# Patient Record
Sex: Female | Born: 1945
Health system: Southern US, Community
[De-identification: ages and names within clinical notes are randomized; demographics above are authoritative.]

## PROBLEM LIST (undated history)

## (undated) ENCOUNTER — Emergency Department (HOSPITAL_BASED_OUTPATIENT_CLINIC_OR_DEPARTMENT_OTHER): Payer: Medicare Other

## (undated) DIAGNOSIS — I2510 Atherosclerotic heart disease of native coronary artery without angina pectoris: Secondary | ICD-10-CM

## (undated) DIAGNOSIS — I480 Paroxysmal atrial fibrillation: Secondary | ICD-10-CM

## (undated) DIAGNOSIS — E785 Hyperlipidemia, unspecified: Secondary | ICD-10-CM

## (undated) DIAGNOSIS — I10 Essential (primary) hypertension: Secondary | ICD-10-CM

## (undated) DIAGNOSIS — I779 Disorder of arteries and arterioles, unspecified: Secondary | ICD-10-CM

## (undated) HISTORY — DX: Atherosclerotic heart disease of native coronary artery without angina pectoris: I25.10

## (undated) HISTORY — DX: Disorder of arteries and arterioles, unspecified: I77.9

## (undated) HISTORY — DX: Paroxysmal atrial fibrillation: I48.0

## (undated) HISTORY — DX: Hyperlipidemia, unspecified: E78.5

---

## 1993-06-03 HISTORY — PX: BREAST EXCISIONAL BIOPSY: SUR124

## 2004-04-05 ENCOUNTER — Ambulatory Visit: Payer: Self-pay | Admitting: Family Medicine

## 2004-08-08 ENCOUNTER — Ambulatory Visit: Payer: Self-pay

## 2005-04-10 ENCOUNTER — Ambulatory Visit: Payer: Self-pay | Admitting: Unknown Physician Specialty

## 2005-06-03 HISTORY — PX: FRACTURE SURGERY: SHX138

## 2006-04-23 ENCOUNTER — Ambulatory Visit: Payer: Self-pay | Admitting: Unknown Physician Specialty

## 2006-09-03 ENCOUNTER — Inpatient Hospital Stay: Payer: Self-pay | Admitting: Specialist

## 2006-09-03 ENCOUNTER — Other Ambulatory Visit: Payer: Self-pay

## 2006-09-09 ENCOUNTER — Encounter: Payer: Self-pay | Admitting: Internal Medicine

## 2006-10-02 ENCOUNTER — Encounter: Payer: Self-pay | Admitting: Internal Medicine

## 2007-05-05 ENCOUNTER — Ambulatory Visit: Payer: Self-pay | Admitting: Unknown Physician Specialty

## 2007-08-11 ENCOUNTER — Emergency Department: Payer: Self-pay | Admitting: Internal Medicine

## 2007-08-22 ENCOUNTER — Ambulatory Visit: Payer: Self-pay | Admitting: Specialist

## 2008-05-17 ENCOUNTER — Ambulatory Visit: Payer: Self-pay | Admitting: Unknown Physician Specialty

## 2009-05-19 ENCOUNTER — Ambulatory Visit: Payer: Self-pay | Admitting: Unknown Physician Specialty

## 2010-06-07 ENCOUNTER — Ambulatory Visit: Payer: Self-pay | Admitting: Unknown Physician Specialty

## 2011-07-09 ENCOUNTER — Ambulatory Visit: Payer: Self-pay | Admitting: Unknown Physician Specialty

## 2011-08-26 ENCOUNTER — Emergency Department: Payer: Self-pay | Admitting: *Deleted

## 2011-08-26 LAB — COMPREHENSIVE METABOLIC PANEL
Albumin: 4.1 g/dL (ref 3.4–5.0)
Anion Gap: 8 (ref 7–16)
BUN: 15 mg/dL (ref 7–18)
Bilirubin,Total: 0.4 mg/dL (ref 0.2–1.0)
Calcium, Total: 10 mg/dL (ref 8.5–10.1)
Chloride: 105 mmol/L (ref 98–107)
Co2: 29 mmol/L (ref 21–32)
EGFR (African American): >60
EGFR (Non-African Amer.): >60
Glucose: 105 mg/dL — ABNORMAL HIGH (ref 65–99)
Potassium: 4.2 mmol/L (ref 3.5–5.1)
SGPT (ALT): 21 U/L
Sodium: 142 mmol/L (ref 136–145)
Total Protein: 7.3 g/dL (ref 6.4–8.2)

## 2011-08-26 LAB — CK TOTAL AND CKMB (NOT AT ARMC): CK-MB: 2.7 ng/mL (ref 0.5–3.6)

## 2011-08-26 LAB — CBC
HCT: 37.8 % (ref 35.0–47.0)
MCV: 90 fL (ref 80–100)
Platelet: 207 10*3/uL (ref 150–440)
RDW: 13.4 % (ref 11.5–14.5)
WBC: 4.8 10*3/uL (ref 3.6–11.0)

## 2011-08-26 LAB — TROPONIN I
Troponin-I: 0.02 ng/mL
Troponin-I: 0.02 ng/mL

## 2012-09-21 ENCOUNTER — Ambulatory Visit: Payer: Self-pay | Admitting: Family Medicine

## 2012-09-23 ENCOUNTER — Ambulatory Visit: Payer: Self-pay | Admitting: Family Medicine

## 2013-03-30 ENCOUNTER — Ambulatory Visit: Payer: Self-pay | Admitting: Family Medicine

## 2013-09-23 ENCOUNTER — Ambulatory Visit: Payer: Self-pay | Admitting: Family Medicine

## 2014-09-02 ENCOUNTER — Ambulatory Visit
Admit: 2014-09-02 | Disposition: A | Discharge status: Home / Self Care | Payer: Self-pay | Attending: Gastroenterology | Admitting: Gastroenterology

## 2014-09-16 ENCOUNTER — Other Ambulatory Visit: Payer: Self-pay | Admitting: Family Medicine

## 2014-09-16 DIAGNOSIS — Z1231 Encounter for screening mammogram for malignant neoplasm of breast: Secondary | ICD-10-CM

## 2014-09-26 LAB — SURGICAL PATHOLOGY

## 2014-10-10 ENCOUNTER — Other Ambulatory Visit: Payer: Self-pay | Admitting: Family Medicine

## 2014-10-10 ENCOUNTER — Ambulatory Visit
Admission: RE | Admit: 2014-10-10 | Discharge: 2014-10-10 | Disposition: A | Discharge status: Home / Self Care | Payer: Medicare Other | Source: Ambulatory Visit | Attending: Family Medicine | Admitting: Family Medicine

## 2014-10-10 DIAGNOSIS — Z1231 Encounter for screening mammogram for malignant neoplasm of breast: Secondary | ICD-10-CM | POA: Diagnosis not present

## 2015-04-11 ENCOUNTER — Telehealth: Payer: Self-pay

## 2015-04-11 NOTE — Telephone Encounter (Signed)
Left message

## 2015-04-11 NOTE — Telephone Encounter (Signed)
US completed, report called, US of Left Lower extremity, negative for blood clot.  Please advise.

## 2015-04-11 NOTE — Telephone Encounter (Signed)
Please let pt know that US was negative for DVT. Please let her know to call us if any recurrent or persistent symptoms.

## 2015-09-19 ENCOUNTER — Other Ambulatory Visit: Payer: Self-pay | Admitting: Family Medicine

## 2015-09-19 DIAGNOSIS — Z1231 Encounter for screening mammogram for malignant neoplasm of breast: Secondary | ICD-10-CM

## 2015-10-26 ENCOUNTER — Ambulatory Visit
Admission: RE | Admit: 2015-10-26 | Discharge: 2015-10-26 | Disposition: A | Discharge status: Home / Self Care | Payer: Medicare Other | Source: Ambulatory Visit | Attending: Family Medicine | Admitting: Family Medicine

## 2015-10-26 ENCOUNTER — Other Ambulatory Visit: Payer: Self-pay | Admitting: Family Medicine

## 2015-10-26 DIAGNOSIS — Z1231 Encounter for screening mammogram for malignant neoplasm of breast: Secondary | ICD-10-CM | POA: Insufficient documentation

## 2016-09-30 ENCOUNTER — Other Ambulatory Visit: Payer: Self-pay | Admitting: Family Medicine

## 2016-09-30 DIAGNOSIS — Z1231 Encounter for screening mammogram for malignant neoplasm of breast: Secondary | ICD-10-CM

## 2016-10-31 ENCOUNTER — Ambulatory Visit
Admission: RE | Admit: 2016-10-31 | Discharge: 2016-10-31 | Disposition: A | Discharge status: Home / Self Care | Payer: Medicare Other | Source: Ambulatory Visit | Attending: Family Medicine | Admitting: Family Medicine

## 2016-10-31 DIAGNOSIS — Z1231 Encounter for screening mammogram for malignant neoplasm of breast: Secondary | ICD-10-CM

## 2017-10-07 ENCOUNTER — Other Ambulatory Visit: Payer: Self-pay | Admitting: Family Medicine

## 2017-10-07 DIAGNOSIS — Z1231 Encounter for screening mammogram for malignant neoplasm of breast: Secondary | ICD-10-CM

## 2017-11-10 ENCOUNTER — Ambulatory Visit
Admission: RE | Admit: 2017-11-10 | Discharge: 2017-11-10 | Disposition: A | Discharge status: Home / Self Care | Payer: Medicare Other | Source: Ambulatory Visit | Attending: Family Medicine | Admitting: Family Medicine

## 2017-11-10 DIAGNOSIS — Z1231 Encounter for screening mammogram for malignant neoplasm of breast: Secondary | ICD-10-CM | POA: Insufficient documentation

## 2018-07-01 ENCOUNTER — Other Ambulatory Visit: Payer: Self-pay | Admitting: Orthopaedic Surgery

## 2018-07-28 NOTE — Patient Instructions (Addendum)
Zasha Montiel  07/28/2018   Your procedure is scheduled on: Tuesday 08/04/2018  Report to [facility] Main  Entrance              Report to admitting at   110 PM    Call this number if you have problems the morning of surgery [phone]    Remember: Do not eat food  :After Midnight. May have clear liquids from midnight up until 0940 am then nothing until after surgery!    CLEAR LIQUID DIET   Foods Allowed                                                                     Foods Excluded  Coffee and tea, regular and decaf                             liquids that you cannot  Plain Jell-O in any flavor                                             see through such as: Fruit ices (not with fruit pulp)                                     milk, soups, orange juice  Iced Popsicles                                    All solid food Carbonated beverages, regular and diet                                    Cranberry, grape and apple juices Sports drinks like Gatorade Lightly seasoned clear broth or consume(fat free) Sugar, honey syrup  Sample Menu Breakfast                                Lunch                                     Supper Cranberry juice                    Beef broth                            Chicken broth Jell-O                                     Grape juice  Apple juice Coffee or tea                        Jell-O                                      Popsicle                                                Coffee or tea                        Coffee or tea  _____________________________________________________________________                BRUSH YOUR TEETH MORNING OF SURGERY AND RINSE YOUR MOUTH OUT, NO CHEWING GUM CANDY OR MINTS.     Take these medicines the morning of surgery with A SIP OF WATER: Amlodipine (Norvasc), Hydralazine (Apresoline), use Flonase nasal spray                                  You may  not have any metal on your body including hair pins and              piercings  Do not wear jewelry, make-up, lotions, powders or perfumes, deodorant             Do not wear nail polish.  Do not shave  48 hours prior to surgery.                Do not bring valuables to the hospital. [facility] IS NOT             RESPONSIBLE   FOR VALUABLES.  Contacts, dentures or bridgework may not be worn into surgery.  Leave suitcase in the car. After surgery it may be brought to your room.                  Please read over the following fact sheets you were given: _____________________________________________________________________             [facility] - Preparing for Surgery Before surgery, you can play an important role.  Because skin is not sterile, your skin needs to be as free of germs as possible.  You can reduce the number of germs on your skin by washing with CHG (chlorahexidine gluconate) soap before surgery.  CHG is an antiseptic cleaner which kills germs and bonds with the skin to continue killing germs even after washing. Please DO NOT use if you have an allergy to CHG or antibacterial soaps.  If your skin becomes reddened/irritated stop using the CHG and inform your nurse when you arrive at Short Stay. Do not shave (including legs and underarms) for at least 48 hours prior to the first CHG shower.  You may shave your face/neck. Please follow these instructions carefully:  1.  Shower with CHG Soap the night before surgery and the  morning of Surgery.  2.  If you choose to wash your hair, wash your hair first as usual with your  normal  shampoo.  3.  After you shampoo, rinse your hair and body thoroughly to remove the  shampoo.  4.  Use CHG as you would any other liquid soap.  You can apply chg directly  to the skin and wash                       Gently with a scrungie or clean washcloth.  5.  Apply the CHG Soap to your body ONLY FROM THE NECK DOWN.   Do not use  on face/ open                           Wound or open sores. Avoid contact with eyes, ears mouth and genitals (private parts).                       Wash face,  Genitals (private parts) with your normal soap.             6.  Wash thoroughly, paying special attention to the area where your surgery  will be performed.  7.  Thoroughly rinse your body with warm water from the neck down.  8.  DO NOT shower/wash with your normal soap after using and rinsing off  the CHG Soap.                9.  Pat yourself dry with a clean towel.            10.  Wear clean pajamas.            11.  Place clean sheets on your bed the night of your first shower and do not  sleep with pets. Day of Surgery : Do not apply any lotions/deodorants the morning of surgery.  Please wear clean clothes to the hospital/surgery center.  FAILURE TO FOLLOW THESE INSTRUCTIONS MAY RESULT IN THE CANCELLATION OF YOUR SURGERY PATIENT SIGNATURE_________________________________  NURSE SIGNATURE__________________________________  ________________________________________________________________________   Adam Phenix  An incentive spirometer is a tool that can help keep your lungs clear and active. This tool measures how well you are filling your lungs with each breath. Taking long deep breaths may help reverse or decrease the chance of developing breathing (pulmonary) problems (especially infection) following:  A long period of time when you are unable to move or be active. BEFORE THE PROCEDURE   If the spirometer includes an indicator to show your best effort, your nurse or respiratory therapist will set it to a desired goal.  If possible, sit up straight or lean slightly forward. Try not to slouch.  Hold the incentive spirometer in an upright position. INSTRUCTIONS FOR USE  1. Sit on the edge of your bed if possible, or sit up as far as you can in bed or on a chair. 2. Hold the incentive spirometer in an upright  position. 3. Breathe out normally. 4. Place the mouthpiece in your mouth and seal your lips tightly around it. 5. Breathe in slowly and as deeply as possible, raising the piston or the ball toward the top of the column. 6. Hold your breath for 3-5 seconds or for as long as possible. Allow the piston or ball to fall to the bottom of the column. 7. Remove the mouthpiece from your mouth and breathe out normally. 8. Rest for a few seconds and repeat Steps 1 through 7 at least 10 times every 1-2 hours when you are awake. Take your time and take a few normal breaths between deep breaths. 9. The spirometer may include an indicator to  show your best effort. Use the indicator as a goal to work toward during each repetition. 10. After each set of 10 deep breaths, practice coughing to be sure your lungs are clear. If you have an incision (the cut made at the time of surgery), support your incision when coughing by placing a pillow or rolled up towels firmly against it. Once you are able to get out of bed, walk around indoors and cough well. You may stop using the incentive spirometer when instructed by your caregiver.  RISKS AND COMPLICATIONS  Take your time so you do not get dizzy or light-headed.  If you are in pain, you may need to take or ask for pain medication before doing incentive spirometry. It is harder to take a deep breath if you are having pain. AFTER USE  Rest and breathe slowly and easily.  It can be helpful to keep track of a log of your progress. Your caregiver can provide you with a simple table to help with this. If you are using the spirometer at home, follow these instructions: [place] IF:   You are having difficultly using the spirometer.  You have trouble using the spirometer as often as instructed.  Your pain medication is not giving enough relief while using the spirometer.  You develop fever of 100.5 F (38.1 C) or higher. SEEK IMMEDIATE MEDICAL CARE IF:    You cough up bloody sputum that had not been present before.  You develop fever of 102 F (38.9 C) or greater.  You develop worsening pain at or near the incision site. MAKE SURE YOU:   Understand these instructions.  Will watch your condition.  Will get help right away if you are not doing well or get worse. Document Released: 09/30/2006 Document Revised: 08/12/2011 Document Reviewed: 12/01/2006 Exit[employer] Patient Information 2014 [employer], [place].   ________________________________________________________________________  WHAT IS A BLOOD TRANSFUSION? Blood Transfusion Information  A transfusion is the replacement of blood or some of its parts. Blood is made up of multiple cells which provide different functions.  Red blood cells carry oxygen and are used for blood loss replacement.  White blood cells fight against infection.  Platelets control bleeding.  Plasma helps clot blood.  Other blood products are available for specialized needs, such as hemophilia or other clotting disorders. BEFORE THE TRANSFUSION  Who gives blood for transfusions?   Healthy volunteers who are fully evaluated to make sure their blood is safe. This is blood bank blood. Transfusion therapy is the safest it has ever been in the practice of medicine. Before blood is taken from a donor, a complete history is taken to make sure that person has no history of diseases nor engages in risky social behavior (examples are intravenous drug use or sexual activity with multiple partners). The donor's travel history is screened to minimize risk of transmitting infections, such as malaria. The donated blood is tested for signs of infectious diseases, such as HIV and hepatitis. The blood is then tested to be sure it is compatible with you in order to minimize the chance of a transfusion reaction. If you or a relative donates blood, this is often done in anticipation of surgery and is not appropriate for emergency  situations. It takes many days to process the donated blood. RISKS AND COMPLICATIONS Although transfusion therapy is very safe and saves many lives, the main dangers of transfusion include:   Getting an infectious disease.  Developing a transfusion reaction. This is an allergic reaction  to something in the blood you were given. Every precaution is taken to prevent this. The decision to have a blood transfusion has been considered carefully by your caregiver before blood is given. Blood is not given unless the benefits outweigh the risks. AFTER THE TRANSFUSION  Right after receiving a blood transfusion, you will usually feel much better and more energetic. This is especially true if your red blood cells have gotten low (anemic). The transfusion raises the level of the red blood cells which carry oxygen, and this usually causes an energy increase.  The nurse administering the transfusion will monitor you carefully for complications. HOME CARE INSTRUCTIONS  No special instructions are needed after a transfusion. You may find your energy is better. Speak with your caregiver about any limitations on activity for underlying diseases you may have. SEEK MEDICAL CARE IF:   Your condition is not improving after your transfusion.  You develop redness or irritation at the intravenous (IV) site. SEEK IMMEDIATE MEDICAL CARE IF:  Any of the following symptoms occur over the next 12 hours:  Shaking chills.  You have a temperature by mouth above 102 F (38.9 C), not controlled by medicine.  Chest, back, or muscle pain.  People around you feel you are not acting correctly or are confused.  Shortness of breath or difficulty breathing.  Dizziness and fainting.  You get a rash or develop hives.  You have a decrease in urine output.  Your urine turns a dark color or changes to pink, red, or brown. Any of the following symptoms occur over the next 10 days:  You have a temperature by mouth above  102 F (38.9 C), not controlled by medicine.  Shortness of breath.  Weakness after normal activity.  The white part of the eye turns yellow (jaundice).  You have a decrease in the amount of urine or are urinating less often.  Your urine turns a dark color or changes to pink, red, or brown. Document Released: 05/17/2000 Document Revised: 08/12/2011 Document Reviewed: 01/04/2008 [facility] Patient Information 2014 [place], [place].  _______________________________________________________________________

## 2018-07-29 ENCOUNTER — Encounter (HOSPITAL_COMMUNITY): Payer: Self-pay

## 2018-07-29 ENCOUNTER — Other Ambulatory Visit: Payer: Self-pay

## 2018-07-29 ENCOUNTER — Encounter (HOSPITAL_COMMUNITY)
Admission: RE | Admit: 2018-07-29 | Discharge: 2018-07-29 | Disposition: A | Discharge status: Home / Self Care | Payer: Medicare Other | Source: Ambulatory Visit | Attending: Orthopaedic Surgery | Admitting: Orthopaedic Surgery

## 2018-07-29 ENCOUNTER — Ambulatory Visit (HOSPITAL_COMMUNITY)
Admission: RE | Admit: 2018-07-29 | Discharge: 2018-07-29 | Disposition: A | Discharge status: Home / Self Care | Payer: Medicare Other | Source: Ambulatory Visit | Attending: Orthopaedic Surgery | Admitting: Orthopaedic Surgery

## 2018-07-29 DIAGNOSIS — Z01818 Encounter for other preprocedural examination: Secondary | ICD-10-CM

## 2018-07-29 HISTORY — DX: Essential (primary) hypertension: I10

## 2018-07-29 LAB — APTT: APTT: 28 s (ref 24–36)

## 2018-07-29 LAB — CBC WITH DIFFERENTIAL/PLATELET
Abs Immature Granulocytes: 0.03 10*3/uL (ref 0.00–0.07)
Basophils Absolute: 0 10*3/uL (ref 0.0–0.1)
Basophils Relative: 1 %
Eosinophils Absolute: 0.1 10*3/uL (ref 0.0–0.5)
Eosinophils Relative: 2 %
HCT: 39.8 % (ref 36.0–46.0)
HEMOGLOBIN: 13 g/dL (ref 12.0–15.0)
Immature Granulocytes: 0 %
LYMPHS PCT: 28 %
Lymphs Abs: 1.9 10*3/uL (ref 0.7–4.0)
MCH: 29.9 pg (ref 26.0–34.0)
MCHC: 32.7 g/dL (ref 30.0–36.0)
MCV: 91.5 fL (ref 80.0–100.0)
Monocytes Absolute: 0.5 10*3/uL (ref 0.1–1.0)
Monocytes Relative: 7 %
Neutro Abs: 4.2 10*3/uL (ref 1.7–7.7)
Neutrophils Relative %: 62 %
Platelets: 209 10*3/uL (ref 150–400)
RBC: 4.35 MIL/uL (ref 3.87–5.11)
RDW: 11.9 % (ref 11.5–15.5)
WBC: 6.8 10*3/uL (ref 4.0–10.5)
nRBC: 0 % (ref 0.0–0.2)

## 2018-07-29 LAB — URINALYSIS, ROUTINE W REFLEX MICROSCOPIC
Bilirubin Urine: NEGATIVE
GLUCOSE, UA: NEGATIVE mg/dL
Hgb urine dipstick: NEGATIVE
Ketones, ur: NEGATIVE mg/dL
Nitrite: NEGATIVE
Protein, ur: NEGATIVE mg/dL
SPECIFIC GRAVITY, URINE: 1.01 (ref 1.005–1.030)
pH: 8 (ref 5.0–8.0)

## 2018-07-29 LAB — BASIC METABOLIC PANEL
Anion gap: 7 (ref 5–15)
BUN: 15 mg/dL (ref 8–23)
CO2: 27 mmol/L (ref 22–32)
Calcium: 9.9 mg/dL (ref 8.9–10.3)
Chloride: 105 mmol/L (ref 98–111)
Creatinine, Ser: 0.76 mg/dL (ref 0.44–1.00)
GFR calc Af Amer: >60 mL/min (ref >60)
GFR calc non Af Amer: >60 mL/min (ref >60)
Glucose, Bld: 100 mg/dL — ABNORMAL HIGH (ref 70–99)
Potassium: 3.9 mmol/L (ref 3.5–5.1)
Sodium: 139 mmol/L (ref 135–145)

## 2018-07-29 LAB — SURGICAL PCR SCREEN
MRSA, PCR: NEGATIVE
STAPHYLOCOCCUS AUREUS: POSITIVE — AB

## 2018-07-29 LAB — PROTIME-INR
INR: 0.9 (ref 0.8–1.2)
Prothrombin Time: 11.9 seconds (ref 11.4–15.2)

## 2018-07-29 LAB — ABO/RH: ABO/RH(D): O NEG

## 2018-07-29 NOTE — H&P (Signed)
TOTAL HIP ADMISSION H&P  Patient is admitted for right total hip arthroplasty.  Subjective:  Chief Complaint: right hip pain  HPI: Colleen Cole, 73 y.o. female, has a history of pain and functional disability in the right hip(s) due to arthritis and patient has failed non-surgical conservative treatments for greater than 12 weeks to include NSAID's and/or analgesics, corticosteriod injections, flexibility and strengthening excercises, supervised PT with diminished ADL's post treatment, use of assistive devices, weight reduction as appropriate and activity modification.  Onset of symptoms was gradual starting 5 years ago with gradually worsening course since that time.The patient noted no past surgery on the right hip(s).  Patient currently rates pain in the right hip at 10 out of 10 with activity. Patient has night pain, worsening of pain with activity and weight bearing, trendelenberg gait, pain that interfers with activities of daily living and crepitus. Patient has evidence of subchondral cysts, subchondral sclerosis, periarticular osteophytes and joint space narrowing by imaging studies. This condition presents safety issues increasing the risk of falls. There is no current active infection.  There are no active problems to display for this patient.  Past Medical History:  Diagnosis Date  . Hypertension     Past Surgical History:  Procedure Laterality Date  . BREAST EXCISIONAL BIOPSY Right 1995   NEG  . FRACTURE SURGERY  2007   fracture in left leg-at patella, tibia and fibula    No current facility-administered medications for this encounter.    Current Outpatient Medications  Medication Sig Dispense Refill Last Dose  . acetaminophen (TYLENOL) 500 MG tablet Take 500 mg by mouth every 6 (six) hours as needed for mild pain or headache.     Marland Kitchen amLODipine (NORVASC) 5 MG tablet Take 10 mg by mouth daily.     . Calcium 600-200 MG-UNIT tablet Take 1 tablet by mouth 2 (two) times daily.      . celecoxib (CELEBREX) 200 MG capsule Take 200 mg by mouth daily.     . hydrALAZINE (APRESOLINE) 25 MG tablet Take 25 mg by mouth 2 (two) times daily.     . hydrochlorothiazide (HYDRODIURIL) 25 MG tablet Take 25 mg by mouth daily.     Marland Kitchen losartan (COZAAR) 100 MG tablet Take 100 mg by mouth daily.     . SENNA PO Take 1 tablet by mouth daily.     Marland Kitchen VITAMIN D PO Take 1 capsule by mouth daily.     . fluticasone (FLONASE) 50 MCG/ACT nasal spray Place 1 spray into both nostrils daily as needed for allergies or rhinitis.      No Known Allergies  Social History   Tobacco Use  . Smoking status: Former Smoker    Types: Cigarettes    Last attempt to quit: 07/13/1998    Years since quitting: 20.0  . Smokeless tobacco: Never Used  Substance Use Topics  . Alcohol use: Yes    Comment: occassionally    Family History  Problem Relation Age of Onset  . Breast cancer Neg Hx      Review of Systems  Musculoskeletal: Positive for joint pain.       Right hip  All other systems reviewed and are negative.   Objective:  Physical Exam  Constitutional: She is oriented to person, place, and time. She appears well-developed and well-nourished.  HENT:  Head: Normocephalic and atraumatic.  Eyes: Pupils are equal, round, and reactive to light.  Neck: Normal range of motion.  Cardiovascular: Normal rate and regular rhythm.  Respiratory: Effort normal.  GI: Soft.  Musculoskeletal:     Comments: Right hip motion is a little limited and very painful in internal rotation.  Straight leg raise is negative.  Her skin is benign.  She walks with an altered gait.  Straight leg raise causes low back pain only with nothing radicular.  Sensation and motor function are intact in her feet with palpable pulses on both sides.    Neurological: She is alert and oriented to person, place, and time.  Skin: Skin is warm and dry.  Psychiatric: She has a normal mood and affect. Her behavior is normal. Judgment and thought  content normal.    Vital signs in last 24 hours: Temp:  [97.8 F (36.6 C)] 97.8 F (36.6 C) (02/26 1103) Pulse Rate:  [65] 65 (02/26 1103) Resp:  [16] 16 (02/26 1103) BP: (137)/(67) 137/67 (02/26 1103) SpO2:  [97 %] 97 % (02/26 1103) Weight:  [82.7 kg] 82.7 kg (02/26 1103)  Labs:   Estimated body mass index is 33.34 kg/m as calculated from the following:   Height as of 07/29/18: 5\' 2"  (1.575 m).   Weight as of 07/29/18: 82.7 kg.   Imaging Review Plain radiographs demonstrate severe degenerative joint disease of the right hip(s). The bone quality appears to be good for age and reported activity level.      Assessment/Plan:  End stage primary arthritis, right hip(s)  The patient history, physical examination, clinical judgement of the provider and imaging studies are consistent with end stage degenerative joint disease of the right hip(s) and total hip arthroplasty is deemed medically necessary. The treatment options including medical management, injection therapy, arthroscopy and arthroplasty were discussed at length. The risks and benefits of total hip arthroplasty were presented and reviewed. The risks due to aseptic loosening, infection, stiffness, dislocation/subluxation,  thromboembolic complications and other imponderables were discussed.  The patient acknowledged the explanation, agreed to proceed with the plan and consent was signed. Patient is being admitted for inpatient treatment for surgery, pain control, PT, OT, prophylactic antibiotics, VTE prophylaxis, progressive ambulation and ADL's and discharge planning.The patient is planning to be discharged home with home health services

## 2018-07-31 ENCOUNTER — Other Ambulatory Visit: Payer: Self-pay | Admitting: Orthopaedic Surgery

## 2018-07-31 NOTE — Care Plan (Signed)
Spoke with patient prior to surgery. She plans to discharge to home with friends to assist. She is set up to go to OPPT at [facility] starting on 08/07/18. Patient is aware of plan and agreeable. Choice offered. Equipment ordered pre-op.   Shauna Hugh, RNCM  [phone]

## 2018-07-31 NOTE — Anesthesia Preprocedure Evaluation (Addendum)
Anesthesia Evaluation  Patient identified by MRN, date of birth, ID band Patient awake    Reviewed: Allergy & Precautions, NPO status , Patient's Chart, lab work & pertinent test results  Airway Mallampati: II  TM Distance: >3 FB Neck ROM: Full    Dental  (+) Teeth Intact, Dental Advisory Given   Pulmonary former smoker,    breath sounds clear to auscultation       Cardiovascular hypertension,  Rhythm:Regular Rate:Normal     Neuro/Psych    GI/Hepatic   Endo/Other    Renal/GU      Musculoskeletal   Abdominal   Peds  Hematology   Anesthesia Other Findings   Reproductive/Obstetrics                           Anesthesia Physical Anesthesia Plan  ASA: III  Anesthesia Plan: Spinal   Post-op Pain Management:    Induction: Intravenous  PONV Risk Score and Plan:   Airway Management Planned: Simple Face Mask and Natural Airway  Additional Equipment:   Intra-op Plan:   Post-operative Plan:   Informed Consent: I have reviewed the patients History and Physical, chart, labs and discussed the procedure including the risks, benefits and alternatives for the proposed anesthesia with the patient or authorized representative who has indicated his/her understanding and acceptance.     Dental advisory given  Plan Discussed with: CRNA and Anesthesiologist  Anesthesia Plan Comments: (Dr. Jerl Santos made aware of chest xray findings-Jessica Zanetto, PA-C)       Anesthesia Quick Evaluation

## 2018-08-03 MED ORDER — TRANEXAMIC ACID 1000 MG/10ML IV SOLN
2000.0000 mg | INTRAVENOUS | Status: DC
  Filled 2018-08-03: qty 20

## 2018-08-03 MED ORDER — BUPIVACAINE LIPOSOME 1.3 % IJ SUSP
10.0000 mL | Freq: Once | INTRAMUSCULAR | Status: DC
  Filled 2018-08-03: qty 10

## 2018-08-04 ENCOUNTER — Inpatient Hospital Stay (HOSPITAL_COMMUNITY): Payer: Medicare Other | Admitting: Physician Assistant

## 2018-08-04 ENCOUNTER — Inpatient Hospital Stay (HOSPITAL_COMMUNITY)
Admission: RE | Admit: 2018-08-04 | Discharge: 2018-08-06 | DRG: 470 | Disposition: A | Discharge status: Home / Self Care | Payer: Medicare Other | Attending: Orthopaedic Surgery | Admitting: Orthopaedic Surgery

## 2018-08-04 ENCOUNTER — Inpatient Hospital Stay (HOSPITAL_COMMUNITY): Payer: Medicare Other | Admitting: Certified Registered Nurse Anesthetist

## 2018-08-04 ENCOUNTER — Encounter (HOSPITAL_COMMUNITY): Payer: Self-pay | Admitting: Certified Registered Nurse Anesthetist

## 2018-08-04 ENCOUNTER — Inpatient Hospital Stay (HOSPITAL_COMMUNITY): Payer: Medicare Other

## 2018-08-04 ENCOUNTER — Other Ambulatory Visit: Payer: Self-pay

## 2018-08-04 ENCOUNTER — Encounter (HOSPITAL_COMMUNITY)
Admission: RE | Disposition: A | Discharge status: Home / Self Care | Payer: Self-pay | Source: Home / Self Care | Attending: Orthopaedic Surgery

## 2018-08-04 DIAGNOSIS — Z7951 Long term (current) use of inhaled steroids: Secondary | ICD-10-CM

## 2018-08-04 DIAGNOSIS — Z87891 Personal history of nicotine dependence: Secondary | ICD-10-CM

## 2018-08-04 DIAGNOSIS — Z79899 Other long term (current) drug therapy: Secondary | ICD-10-CM

## 2018-08-04 DIAGNOSIS — Z419 Encounter for procedure for purposes other than remedying health state, unspecified: Secondary | ICD-10-CM

## 2018-08-04 DIAGNOSIS — M1611 Unilateral primary osteoarthritis, right hip: Principal | ICD-10-CM | POA: Diagnosis present

## 2018-08-04 DIAGNOSIS — Z9181 History of falling: Secondary | ICD-10-CM

## 2018-08-04 DIAGNOSIS — I10 Essential (primary) hypertension: Secondary | ICD-10-CM | POA: Diagnosis present

## 2018-08-04 HISTORY — PX: TOTAL HIP ARTHROPLASTY: SHX124

## 2018-08-04 LAB — TYPE AND SCREEN
ABO/RH(D): O NEG
Antibody Screen: NEGATIVE

## 2018-08-04 SURGERY — ARTHROPLASTY, HIP, TOTAL, ANTERIOR APPROACH
Anesthesia: Spinal | Site: Hip | Laterality: Right

## 2018-08-04 MED ORDER — TRANEXAMIC ACID-NACL 1000-0.7 MG/100ML-% IV SOLN
1000.0000 mg | INTRAVENOUS | Status: AC
  Administered 2018-08-04: 1000 mg via INTRAVENOUS
  Filled 2018-08-04: qty 100

## 2018-08-04 MED ORDER — LOSARTAN POTASSIUM 50 MG PO TABS
100.0000 mg | ORAL_TABLET | Freq: Every day | ORAL | Status: DC
  Administered 2018-08-04 – 2018-08-05 (×2): 100 mg via ORAL
  Filled 2018-08-04 (×2): qty 2

## 2018-08-04 MED ORDER — ALUM & MAG HYDROXIDE-SIMETH 200-200-20 MG/5ML PO SUSP: 30.0000 mL | ORAL | Status: DC | PRN

## 2018-08-04 MED ORDER — TRANEXAMIC ACID 1000 MG/10ML IV SOLN
INTRAVENOUS | Status: DC | PRN
  Administered 2018-08-04: 2000 mg via TOPICAL

## 2018-08-04 MED ORDER — MIDAZOLAM HCL 2 MG/2ML IJ SOLN
INTRAMUSCULAR | Status: AC
  Filled 2018-08-04: qty 2

## 2018-08-04 MED ORDER — MORPHINE SULFATE (PF) 2 MG/ML IV SOLN: 0.5000 mg | INTRAVENOUS | Status: DC | PRN

## 2018-08-04 MED ORDER — PROPOFOL 10 MG/ML IV BOLUS
INTRAVENOUS | Status: AC
  Filled 2018-08-04: qty 20

## 2018-08-04 MED ORDER — KETOROLAC TROMETHAMINE 15 MG/ML IJ SOLN
7.5000 mg | Freq: Four times a day (QID) | INTRAMUSCULAR | Status: AC
  Administered 2018-08-04 – 2018-08-05 (×4): 7.5 mg via INTRAVENOUS
  Filled 2018-08-04 (×4): qty 1

## 2018-08-04 MED ORDER — PROPOFOL 10 MG/ML IV BOLUS
INTRAVENOUS | Status: AC
  Filled 2018-08-04: qty 60

## 2018-08-04 MED ORDER — SODIUM CHLORIDE 0.9 % IV SOLN
INTRAVENOUS | Status: DC | PRN
  Administered 2018-08-04: 30 ug/min via INTRAVENOUS

## 2018-08-04 MED ORDER — HYDROMORPHONE HCL 1 MG/ML IJ SOLN
INTRAMUSCULAR | Status: AC
  Filled 2018-08-04: qty 1

## 2018-08-04 MED ORDER — BUPIVACAINE HCL (PF) 0.75 % IJ SOLN
INTRAMUSCULAR | Status: DC | PRN
  Administered 2018-08-04: 2 mL via INTRATHECAL

## 2018-08-04 MED ORDER — ONDANSETRON HCL 4 MG/2ML IJ SOLN
4.0000 mg | Freq: Four times a day (QID) | INTRAMUSCULAR | Status: DC | PRN
  Administered 2018-08-04 – 2018-08-05 (×2): 4 mg via INTRAVENOUS
  Filled 2018-08-04 (×2): qty 2

## 2018-08-04 MED ORDER — TRANEXAMIC ACID-NACL 1000-0.7 MG/100ML-% IV SOLN
1000.0000 mg | Freq: Once | INTRAVENOUS | Status: AC
  Administered 2018-08-04: 1000 mg via INTRAVENOUS
  Filled 2018-08-04: qty 100

## 2018-08-04 MED ORDER — AMLODIPINE BESYLATE 10 MG PO TABS
10.0000 mg | ORAL_TABLET | Freq: Every day | ORAL | Status: DC
  Administered 2018-08-05: 10 mg via ORAL
  Filled 2018-08-04: qty 1

## 2018-08-04 MED ORDER — ACETAMINOPHEN 500 MG PO TABS
500.0000 mg | ORAL_TABLET | Freq: Four times a day (QID) | ORAL | Status: AC
  Administered 2018-08-04 – 2018-08-05 (×4): 500 mg via ORAL
  Filled 2018-08-04 (×4): qty 1

## 2018-08-04 MED ORDER — PHENOL 1.4 % MT LIQD
1.0000 | OROMUCOSAL | Status: DC | PRN
  Filled 2018-08-04: qty 177

## 2018-08-04 MED ORDER — BUPIVACAINE HCL (PF) 0.25 % IJ SOLN
INTRAMUSCULAR | Status: AC
  Filled 2018-08-04: qty 30

## 2018-08-04 MED ORDER — HYDRALAZINE HCL 25 MG PO TABS
25.0000 mg | ORAL_TABLET | Freq: Two times a day (BID) | ORAL | Status: DC
  Administered 2018-08-04 – 2018-08-05 (×2): 25 mg via ORAL
  Filled 2018-08-04 (×2): qty 1

## 2018-08-04 MED ORDER — ACETAMINOPHEN 325 MG PO TABS: 325.0000 mg | ORAL_TABLET | Freq: Four times a day (QID) | ORAL | Status: DC | PRN

## 2018-08-04 MED ORDER — HYDROMORPHONE HCL 1 MG/ML IJ SOLN
0.2500 mg | INTRAMUSCULAR | Status: DC | PRN
  Administered 2018-08-04 (×2): 0.5 mg via INTRAVENOUS

## 2018-08-04 MED ORDER — DOCUSATE SODIUM 100 MG PO CAPS
100.0000 mg | ORAL_CAPSULE | Freq: Two times a day (BID) | ORAL | Status: DC
  Administered 2018-08-04 – 2018-08-06 (×4): 100 mg via ORAL
  Filled 2018-08-04 (×4): qty 1

## 2018-08-04 MED ORDER — METHOCARBAMOL 500 MG IVPB - SIMPLE MED
500.0000 mg | Freq: Four times a day (QID) | INTRAVENOUS | Status: DC | PRN
  Administered 2018-08-04: 500 mg via INTRAVENOUS
  Filled 2018-08-04: qty 50

## 2018-08-04 MED ORDER — SODIUM CHLORIDE 0.9 % IR SOLN
Status: DC | PRN
  Administered 2018-08-04: 1000 mL

## 2018-08-04 MED ORDER — METHOCARBAMOL 500 MG PO TABS
500.0000 mg | ORAL_TABLET | Freq: Four times a day (QID) | ORAL | Status: DC | PRN
  Administered 2018-08-05 – 2018-08-06 (×2): 500 mg via ORAL
  Filled 2018-08-04 (×2): qty 1

## 2018-08-04 MED ORDER — FENTANYL CITRATE (PF) 100 MCG/2ML IJ SOLN
INTRAMUSCULAR | Status: DC | PRN
  Administered 2018-08-04: 100 ug via INTRAVENOUS

## 2018-08-04 MED ORDER — CEFAZOLIN SODIUM-DEXTROSE 2-4 GM/100ML-% IV SOLN
2.0000 g | INTRAVENOUS | Status: AC
  Administered 2018-08-04: 2 g via INTRAVENOUS
  Filled 2018-08-04: qty 100

## 2018-08-04 MED ORDER — HYDROCHLOROTHIAZIDE 25 MG PO TABS
25.0000 mg | ORAL_TABLET | Freq: Every day | ORAL | Status: DC
  Administered 2018-08-04 – 2018-08-05 (×2): 25 mg via ORAL
  Filled 2018-08-04 (×2): qty 1

## 2018-08-04 MED ORDER — EPHEDRINE SULFATE 50 MG/ML IJ SOLN
INTRAMUSCULAR | Status: DC | PRN
  Administered 2018-08-04 (×2): 10 mg via INTRAVENOUS

## 2018-08-04 MED ORDER — BISACODYL 5 MG PO TBEC: 5.0000 mg | DELAYED_RELEASE_TABLET | Freq: Every day | ORAL | Status: DC | PRN

## 2018-08-04 MED ORDER — BUPIVACAINE LIPOSOME 1.3 % IJ SUSP
INTRAMUSCULAR | Status: DC | PRN
  Administered 2018-08-04: 10 mL

## 2018-08-04 MED ORDER — PHENYLEPHRINE 40 MCG/ML (10ML) SYRINGE FOR IV PUSH (FOR BLOOD PRESSURE SUPPORT)
PREFILLED_SYRINGE | INTRAVENOUS | Status: AC
  Filled 2018-08-04: qty 10

## 2018-08-04 MED ORDER — HYDROCODONE-ACETAMINOPHEN 5-325 MG PO TABS
1.0000 | ORAL_TABLET | ORAL | Status: DC | PRN
  Administered 2018-08-05: 2 via ORAL
  Administered 2018-08-06: 1 via ORAL
  Filled 2018-08-04: qty 2
  Filled 2018-08-04: qty 1

## 2018-08-04 MED ORDER — ONDANSETRON HCL 4 MG PO TABS: 4.0000 mg | ORAL_TABLET | Freq: Four times a day (QID) | ORAL | Status: DC | PRN

## 2018-08-04 MED ORDER — HYDROCODONE-ACETAMINOPHEN 7.5-325 MG PO TABS
1.0000 | ORAL_TABLET | ORAL | Status: DC | PRN
  Administered 2018-08-04 – 2018-08-06 (×6): 1 via ORAL
  Filled 2018-08-04 (×7): qty 1

## 2018-08-04 MED ORDER — PROPOFOL 500 MG/50ML IV EMUL
INTRAVENOUS | Status: DC | PRN
  Administered 2018-08-04: 30 mg via INTRAVENOUS

## 2018-08-04 MED ORDER — MIDAZOLAM HCL 5 MG/5ML IJ SOLN
INTRAMUSCULAR | Status: DC | PRN
  Administered 2018-08-04: 2 mg via INTRAVENOUS

## 2018-08-04 MED ORDER — LACTATED RINGERS IV SOLN
INTRAVENOUS | Status: DC
  Administered 2018-08-04 (×3): via INTRAVENOUS

## 2018-08-04 MED ORDER — PHENYLEPHRINE HCL 10 MG/ML IJ SOLN
INTRAMUSCULAR | Status: DC | PRN
  Administered 2018-08-04 (×2): 120 ug via INTRAVENOUS

## 2018-08-04 MED ORDER — FENTANYL CITRATE (PF) 100 MCG/2ML IJ SOLN
INTRAMUSCULAR | Status: AC
  Filled 2018-08-04: qty 2

## 2018-08-04 MED ORDER — CEFAZOLIN SODIUM-DEXTROSE 2-4 GM/100ML-% IV SOLN
2.0000 g | Freq: Four times a day (QID) | INTRAVENOUS | Status: AC
  Administered 2018-08-04 – 2018-08-05 (×2): 2 g via INTRAVENOUS
  Filled 2018-08-04 (×2): qty 100

## 2018-08-04 MED ORDER — METOCLOPRAMIDE HCL 5 MG PO TABS: 5.0000 mg | ORAL_TABLET | Freq: Three times a day (TID) | ORAL | Status: DC | PRN

## 2018-08-04 MED ORDER — MENTHOL 3 MG MT LOZG: 1.0000 | LOZENGE | OROMUCOSAL | Status: DC | PRN

## 2018-08-04 MED ORDER — METHOCARBAMOL 500 MG IVPB - SIMPLE MED
INTRAVENOUS | Status: AC
  Filled 2018-08-04: qty 50

## 2018-08-04 MED ORDER — FLUTICASONE PROPIONATE 50 MCG/ACT NA SUSP
1.0000 | Freq: Every day | NASAL | Status: DC | PRN
  Filled 2018-08-04: qty 16

## 2018-08-04 MED ORDER — MEPERIDINE HCL 50 MG/ML IJ SOLN
INTRAMUSCULAR | Status: AC
  Filled 2018-08-04: qty 1

## 2018-08-04 MED ORDER — PROPOFOL 500 MG/50ML IV EMUL
INTRAVENOUS | Status: DC | PRN
  Administered 2018-08-04: 75 ug/kg/min via INTRAVENOUS

## 2018-08-04 MED ORDER — EPHEDRINE 5 MG/ML INJ
INTRAVENOUS | Status: AC
  Filled 2018-08-04: qty 10

## 2018-08-04 MED ORDER — ASPIRIN EC 325 MG PO TBEC
325.0000 mg | DELAYED_RELEASE_TABLET | Freq: Two times a day (BID) | ORAL | Status: DC
  Administered 2018-08-05 – 2018-08-06 (×3): 325 mg via ORAL
  Filled 2018-08-04 (×3): qty 1

## 2018-08-04 MED ORDER — BUPIVACAINE HCL (PF) 0.25 % IJ SOLN
INTRAMUSCULAR | Status: DC | PRN
  Administered 2018-08-04: 30 mL

## 2018-08-04 MED ORDER — DIPHENHYDRAMINE HCL 12.5 MG/5ML PO ELIX: 12.5000 mg | ORAL_SOLUTION | ORAL | Status: DC | PRN

## 2018-08-04 MED ORDER — METOCLOPRAMIDE HCL 5 MG/ML IJ SOLN
5.0000 mg | Freq: Three times a day (TID) | INTRAMUSCULAR | Status: DC | PRN
  Administered 2018-08-05: 10 mg via INTRAVENOUS
  Filled 2018-08-04: qty 2

## 2018-08-04 MED ORDER — LACTATED RINGERS IV SOLN
INTRAVENOUS | Status: DC
  Administered 2018-08-04 – 2018-08-05 (×2): via INTRAVENOUS

## 2018-08-04 MED ORDER — MEPERIDINE HCL 50 MG/ML IJ SOLN
6.2500 mg | INTRAMUSCULAR | Status: DC | PRN
  Administered 2018-08-04: 12.5 mg via INTRAVENOUS

## 2018-08-04 MED ORDER — CHLORHEXIDINE GLUCONATE 4 % EX LIQD: 60.0000 mL | Freq: Once | CUTANEOUS | Status: DC

## 2018-08-04 SURGICAL SUPPLY — 57 items
BAG DECANTER FOR FLEXI CONT (MISCELLANEOUS) ×3 IMPLANT
BAG ZIPLOCK 12X15 (MISCELLANEOUS) ×2 IMPLANT
BIT DRILL 2.8X128 (BIT) ×3 IMPLANT
BLADE CLIPPER SURG (BLADE) ×2 IMPLANT
BLADE EXTENDED COATED 6.5IN (ELECTRODE) ×3 IMPLANT
BLADE SAW SGTL 18X1.27X75 (BLADE) ×3 IMPLANT
BRUSH FEMORAL CANAL (MISCELLANEOUS) IMPLANT
CELLS DAT CNTRL 66122 CELL SVR (MISCELLANEOUS) ×2 IMPLANT
CHLORAPREP W/TINT 26ML (MISCELLANEOUS) ×1 IMPLANT
COVER PERINEAL POST (MISCELLANEOUS) ×3 IMPLANT
COVER SURGICAL LIGHT HANDLE (MISCELLANEOUS) ×6 IMPLANT
COVER WAND RF STERILE (DRAPES) IMPLANT
CUP PINN GRIPTON 54 100 (Cup) ×1 IMPLANT
DECANTER SPIKE VIAL GLASS SM (MISCELLANEOUS) ×3 IMPLANT
DRAPE INCISE IOBAN 66X45 STRL (DRAPES) ×3 IMPLANT
DRAPE STERI IOBAN 125X83 (DRAPES) ×3 IMPLANT
DRAPE U-SHAPE 47X51 STRL (DRAPES) ×8 IMPLANT
DRSG AQUACEL AG ADV 3.5X10 (GAUZE/BANDAGES/DRESSINGS) ×3 IMPLANT
DRSG EMULSION OIL 3X16 NADH (GAUZE/BANDAGES/DRESSINGS) ×2 IMPLANT
ELECT REM PT RETURN 15FT ADLT (MISCELLANEOUS) ×5 IMPLANT
ELIMINATOR HOLE APEX DEPUY (Hips) ×1 IMPLANT
EVACUATOR 1/8 PVC DRAIN (DRAIN) ×2 IMPLANT
FACESHIELD WRAPAROUND (MASK) ×3 IMPLANT
FACESHIELD WRAPAROUND OR TEAM (MASK) ×2 IMPLANT
GAUZE SPONGE 4X4 12PLY STRL (GAUZE/BANDAGES/DRESSINGS) ×3 IMPLANT
GLOVE BIO SURGEON STRL SZ8 (GLOVE) ×10 IMPLANT
GLOVE BIOGEL PI IND STRL 8 (GLOVE) ×8 IMPLANT
GLOVE BIOGEL PI INDICATOR 8 (GLOVE) ×2
GOWN STRL REUS W/TWL XL LVL3 (GOWN DISPOSABLE) ×6 IMPLANT
HEAD CERAMIC DELTA 36 PLUS 1.5 (Hips) ×1 IMPLANT
HOLDER FOLEY CATH W/STRAP (MISCELLANEOUS) ×3 IMPLANT
IMMOBILIZER KNEE 20 (SOFTGOODS)
IMMOBILIZER KNEE 20 THIGH 36 (SOFTGOODS) ×2 IMPLANT
KIT BASIN OR (CUSTOM PROCEDURE TRAY) ×3 IMPLANT
LINER NEUTRAL 54X36MM PLUS 4 (Hips) ×1 IMPLANT
MANIFOLD NEPTUNE II (INSTRUMENTS) ×3 IMPLANT
NDL MAYO 6 CRC TAPER PT (NEEDLE) ×2 IMPLANT
NEEDLE HYPO 22GX1.5 SAFETY (NEEDLE) ×2 IMPLANT
NEEDLE MAYO 6 CRC TAPER PT (NEEDLE) IMPLANT
PACK ANTERIOR HIP CUSTOM (KITS) ×3 IMPLANT
PRESSURIZER FEMORAL UNIV (MISCELLANEOUS) IMPLANT
RETRACTOR WND ALEXIS 18 MED (MISCELLANEOUS) ×2 IMPLANT
RTRCTR WOUND ALEXIS 18CM MED (MISCELLANEOUS) ×3
STAPLER VISISTAT 35W (STAPLE) ×2 IMPLANT
STEM FEM ACTIS STD SZ7 (Nail) ×1 IMPLANT
SUT ETHIBOND NAB CT1 #1 30IN (SUTURE) ×10 IMPLANT
SUT VIC AB 0 CT1 36 (SUTURE) ×8 IMPLANT
SUT VIC AB 1 CT1 27 (SUTURE) ×2
SUT VIC AB 1 CT1 27XBRD ANTBC (SUTURE) ×6 IMPLANT
SUT VIC AB 2-0 CT1 27 (SUTURE) ×2
SUT VIC AB 2-0 CT1 TAPERPNT 27 (SUTURE) ×8 IMPLANT
SUT VIC AB 3-0 PS2 18 (SUTURE) ×1
SUT VIC AB 3-0 PS2 18XBRD (SUTURE) IMPLANT
SUT VLOC 180 0 24IN GS25 (SUTURE) ×1 IMPLANT
SYR 50ML LL SCALE MARK (SYRINGE) ×3 IMPLANT
WATER STERILE IRR 1000ML POUR (IV SOLUTION) ×6 IMPLANT
YANKAUER SUCT BULB TIP 10FT TU (MISCELLANEOUS) ×6 IMPLANT

## 2018-08-04 NOTE — Anesthesia Procedure Notes (Signed)
Spinal  Start time: 08/04/2018 2:40 PM End time: 08/04/2018 2:43 PM Staffing Resident/CRNA: Kizzie Fantasia, CRNA Performed: resident/CRNA  Preanesthetic Checklist Completed: patient identified, site marked, surgical consent, pre-op evaluation, timeout performed, IV checked, risks and benefits discussed and monitors and equipment checked Spinal Block Patient position: sitting Prep: DuraPrep Patient monitoring: heart rate, continuous pulse ox and blood pressure Approach: midline Location: L3-4 Injection technique: single-shot Needle Needle type: Pencan  Needle gauge: 24 G Needle length: 9 cm Needle insertion depth: 7 cm Additional Notes Pt sitting position, sterile prep and drape negative paresthesia/heme

## 2018-08-04 NOTE — Transfer of Care (Signed)
Immediate Anesthesia Transfer of Care Note  Patient: Colleen Cole  Procedure(s) Performed: TOTAL HIP ARTHROPLASTY ANTERIOR APPROACH (Right Hip)  Patient Location: PACU  Anesthesia Type:Spinal  Level of Consciousness: awake, alert  and oriented  Airway & Oxygen Therapy: Patient Spontanous Breathing and Patient connected to face mask oxygen  Post-op Assessment: Report given to RN and Post -op Vital signs reviewed and stable  Post vital signs: Reviewed and stable  Last Vitals:  Vitals Value Taken Time  BP 89/62 08/04/2018  4:30 PM  Temp    Pulse 86 08/04/2018  4:30 PM  Resp 18 08/04/2018  4:30 PM  SpO2 100 % 08/04/2018  4:30 PM  Vitals shown include unvalidated device data.  Last Pain:  Vitals:   08/04/18 1241  TempSrc: Oral  PainSc: 0-No pain      Patients Stated Pain Goal: 4 (08/04/18 1241)  Complications: No apparent anesthesia complications

## 2018-08-04 NOTE — Interval H&P Note (Signed)
History and Physical Interval Note:  08/04/2018 2:30 PM  Colleen Cole  has presented today for surgery, with the diagnosis of Right Anterior Hip Degernative Joint Disease  The various methods of treatment have been discussed with the patient and family. After consideration of risks, benefits and other options for treatment, the patient has consented to  Procedure(s): TOTAL HIP ARTHROPLASTY (Right) as a surgical intervention .  The patient's history has been reviewed, patient examined, no change in status, stable for surgery.  I have reviewed the patient's chart and labs.  Questions were answered to the patient's satisfaction.     Velna Ochs

## 2018-08-04 NOTE — Care Plan (Signed)
Ortho Bundle Case Management Note  Patient Details  Name: Colleen Cole MRN: [id number] Date of Birth: 08/13/1945   Spoke with patient prior to surgery. She plans to discharge to home with friends to assist. She is set up to go to OPPT at [facility] starting on 08/07/18. Patient is aware of plan and agreeable. Choice offered. Equipment ordered pre-op.                  DME Arranged:  Dan Humphreys rolling DME Agency:  [facility]  HH Arranged:    HH Agency:     Additional Comments: Please contact me with any questions of if this plan should need to change.  Shauna Hugh,  RN,BSN,MHA,CCM  [facility]  [phone] 08/04/2018, 3:15 PM

## 2018-08-04 NOTE — Op Note (Signed)

## 2018-08-05 ENCOUNTER — Encounter (HOSPITAL_COMMUNITY): Payer: Self-pay | Admitting: Orthopaedic Surgery

## 2018-08-05 DIAGNOSIS — I10 Essential (primary) hypertension: Secondary | ICD-10-CM | POA: Diagnosis present

## 2018-08-05 DIAGNOSIS — Z7951 Long term (current) use of inhaled steroids: Secondary | ICD-10-CM | POA: Diagnosis not present

## 2018-08-05 DIAGNOSIS — Z9181 History of falling: Secondary | ICD-10-CM | POA: Diagnosis not present

## 2018-08-05 DIAGNOSIS — M1611 Unilateral primary osteoarthritis, right hip: Secondary | ICD-10-CM | POA: Diagnosis present

## 2018-08-05 DIAGNOSIS — Z79899 Other long term (current) drug therapy: Secondary | ICD-10-CM | POA: Diagnosis not present

## 2018-08-05 DIAGNOSIS — Z87891 Personal history of nicotine dependence: Secondary | ICD-10-CM | POA: Diagnosis not present

## 2018-08-05 MED ORDER — SODIUM CHLORIDE 0.9 % IV BOLUS
500.0000 mL | Freq: Once | INTRAVENOUS | Status: AC
  Administered 2018-08-05: 500 mL via INTRAVENOUS

## 2018-08-05 MED ORDER — TIZANIDINE HCL 4 MG PO TABS: 4.0000 mg | ORAL_TABLET | Freq: Four times a day (QID) | ORAL | 1 refills | Status: AC | PRN

## 2018-08-05 MED ORDER — ASPIRIN 325 MG PO TBEC: 325.0000 mg | DELAYED_RELEASE_TABLET | Freq: Two times a day (BID) | ORAL | 0 refills | Status: DC

## 2018-08-05 MED ORDER — HYDROCODONE-ACETAMINOPHEN 5-325 MG PO TABS: 1.0000 | ORAL_TABLET | Freq: Four times a day (QID) | ORAL | 0 refills | Status: DC | PRN

## 2018-08-05 NOTE — Care Management Note (Signed)
Case Management Note  Patient Details  Name: Bitha Keplar MRN: [id number] Date of Birth: June 21, 1945  Subjective/Objective:                  DISCHARGE PLANNING  Action/Plan:  [facility] D[facility] 3  IN 1 AND R.W. THROUGH [facility] Expected Discharge Date:  08/05/18               Expected Discharge Plan:  Home/Self Care  In-House Referral:     Discharge planning Services  CM Consult  Post Acute Care Choice:  Durable Medical Equipment Choice offered to:  Patient  DME Arranged:  Dan Humphreys rolling DME Agency:  [facility]  HH Arranged:    HH Agency:     Status of Service:  Completed, signed off  If discussed at [employer], dates discussed:    Additional Comments:  Golda Acre, RN 08/05/2018, 10:03 AM

## 2018-08-05 NOTE — Anesthesia Postprocedure Evaluation (Signed)
Anesthesia Post Note  Patient: Colleen Cole  Procedure(s) Performed: TOTAL HIP ARTHROPLASTY ANTERIOR APPROACH (Right Hip)     Patient location during evaluation: Nursing Unit Anesthesia Type: Spinal Level of consciousness: oriented and awake and alert Pain management: pain level controlled Vital Signs Assessment: post-procedure vital signs reviewed and stable Respiratory status: spontaneous breathing and respiratory function stable Cardiovascular status: blood pressure returned to baseline and stable Postop Assessment: no headache, no backache, no apparent nausea or vomiting and patient able to bend at knees Anesthetic complications: no    Last Vitals:  Vitals:   08/05/18 1115 08/05/18 1806  BP: (!) 87/43 (!) 108/47  Pulse:  80  Resp:    Temp:  37 C  SpO2:  92%    Last Pain:  Vitals:   08/05/18 1806  TempSrc: Oral  PainSc:                  Trevor Iha

## 2018-08-05 NOTE — Care Management CC44 (Signed)
Condition Code 44 Documentation Completed  Patient Details  Name: Colleen Cole MRN: [id number] Date of Birth: 10/08/1945   Condition Code 44 given:  Yes Patient signature on Condition Code 44 notice:  Yes Documentation of 2 MD's agreement:  Yes Code 44 added to claim:  Yes    Golda Acre, RN 08/05/2018, 11:22 AM

## 2018-08-05 NOTE — Evaluation (Signed)
Physical Therapy Evaluation Patient Details Name: Colleen Cole MRN: [id number] DOB: September 26, 1945 Today's Date: 08/05/2018   History of Present Illness  R DATHA, hypotension after BP medication  Clinical Impression  The patient reports feeling very weak. Had emesis episode and BP drop after medication. Patient did ambulate x 80', BP 87/43. Reports no dizziness but weak. After ambulation 102/49, after return to bed 97/43. Patient will need another PT session to ensure no BP drop. Pt admitted with above diagnosis. Pt currently with functional limitations due to the deficits listed below (see PT Problem List).  Pt will benefit from skilled PT to increase their independence and safety with mobility to allow discharge to the venue listed below.    RN aware of BP.   Follow Up Recommendations Follow surgeon's recommendation for DC plan and follow-up therapies;Home health PT    Equipment Recommendations  Rolling walker with 5" wheels    Recommendations for Other Services       Precautions / Restrictions Precautions Precautions: Fall Precaution Comments: monitor BP Restrictions RLE Weight Bearing: Weight bearing as tolerated      Mobility  Bed Mobility Overal bed mobility: Needs Assistance Bed Mobility: Supine to Sit;Sit to Supine     Supine to sit: Min assist Sit to supine: Min assist   General bed mobility comments: support right leg  Transfers Overall transfer level: Needs assistance Equipment used: Rolling walker (2 wheeled) Transfers: Sit to/from Stand Sit to Stand: Min assist;Min guard         General transfer comment: cues for hand and right leg position  Ambulation/Gait Ambulation/Gait assistance: Min assist Gait Distance (Feet): 15 Feet(then 80') Assistive device: Rolling walker (2 wheeled) Gait Pattern/deviations: Step-to pattern;Step-through pattern;Antalgic Gait velocity: decr   General Gait Details: cues for sequence. Reports not dizziness. Stopped   Several times to rest.  Information systems manager Rankin (Stroke Patients Only)       Balance                                             Pertinent Vitals/Pain Pain Assessment: 0-10 Pain Score: 4  Pain Location: right thigh Pain Descriptors / Indicators: Sore Pain Intervention(s): Limited activity within patient's tolerance;Monitored during session;Patient requesting pain meds-RN notified;Repositioned    Home Living Family/patient expects to be discharged to:: Private residence Living Arrangements: Other relatives Available Help at Discharge: Family Type of Home: House Home Access: Stairs to enter Entrance Stairs-Rails: None Secretary/administrator of Steps: 2 Home Layout: One level Home Equipment: None      Prior Function Level of Independence: Independent               Hand Dominance        Extremity/Trunk Assessment   Upper Extremity Assessment Upper Extremity Assessment: Overall WFL for tasks assessed    Lower Extremity Assessment Lower Extremity Assessment: RLE deficits/detail RLE Deficits / Details: advances the right leg    Cervical / Trunk Assessment Cervical / Trunk Assessment: Normal  Communication   Communication: No difficulties  Cognition Arousal/Alertness: Awake/alert Behavior During Therapy: WFL for tasks assessed/performed Overall Cognitive Status: Within Functional Limits for tasks assessed  General Comments: reports feeling weak      General Comments      Exercises Total Joint Exercises Ankle Circles/Pumps: AROM;Both;10 reps Quad Sets: AROM;Both;10 reps Short Arc Quad: AROM;Right;10 reps;Supine Heel Slides: AAROM;Right;10 reps;Supine Hip ABduction/ADduction: AAROM;Right;10 reps;Supine   Assessment/Plan    PT Assessment Patient needs continued PT services  PT Problem List Decreased strength;Decreased range of motion;Decreased  activity tolerance;Decreased mobility;Decreased knowledge of precautions;Decreased safety awareness;Decreased knowledge of use of DME;Decreased cognition;Decreased coordination;Cardiopulmonary status limiting activity       PT Treatment Interventions DME instruction;Therapeutic exercise;Gait training;Stair training;Functional mobility training;Therapeutic activities;Patient/family education    PT Goals (Current goals can be found in the Care Plan section)  Acute Rehab PT Goals Patient Stated Goal: to go home PT Goal Formulation: With patient/family Time For Goal Achievement: 08/12/18 Potential to Achieve Goals: Good    Frequency 7X/week   Barriers to discharge        Co-evaluation               AM-PAC PT "6 Clicks" Mobility  Outcome Measure Help needed turning from your back to your side while in a flat bed without using bedrails?: A Little Help needed moving from lying on your back to sitting on the side of a flat bed without using bedrails?: A Little Help needed moving to and from a bed to a chair (including a wheelchair)?: A Little Help needed standing up from a chair using your arms (e.g., wheelchair or bedside chair)?: A Little Help needed to walk in hospital room?: A Little Help needed climbing 3-5 steps with a railing? : A Lot 6 Click Score: 17    End of Session Equipment Utilized During Treatment: Gait belt Activity Tolerance: Patient limited by fatigue;Treatment limited secondary to medical complications (Comment) Patient left: in bed;with call bell/phone within reach;with family/visitor present Nurse Communication: Mobility status PT Visit Diagnosis: Unsteadiness on feet (R26.81)    Time: 4599-7741 PT Time Calculation (min) (ACUTE ONLY): 45 min   Charges:   PT Evaluation $PT Eval Low Complexity: 1 Low PT Treatments $Gait Training: 8-22 mins $Therapeutic Exercise: 8-22 mins        Blanchard Kelch PT Acute Rehabilitation Services Pager  [phone] Office [phone]   Rada Hay 08/05/2018, 1:30 PM

## 2018-08-05 NOTE — Progress Notes (Signed)
Physical Therapy Treatment Patient Details Name: Colleen Cole MRN: [id number] DOB: 11/20/1945 Today's Date: 08/05/2018    History of Present Illness R DATHA, hypotension after BP medication    PT Comments    Patient's BP 78/51 in supine. RN notified. Assisted [patient to [facility] and ambulated x 10', then back to bed. No dizziness. Plans DC  Tomorrow, has 2 steps to enter.  Follow Up Recommendations  Follow surgeon's recommendation for DC plan and follow-up therapies;Outpatient PT(scheduled for 3/6)     Equipment Recommendations  Rolling walker with 5" wheels    Recommendations for Other Services       Precautions / Restrictions Precautions Precautions: Fall Precaution Comments: monitor BP Restrictions RLE Weight Bearing: Weight bearing as tolerated    Mobility  Bed Mobility Overal bed mobility: Needs Assistance Bed Mobility: Supine to Sit;Sit to Supine     Supine to sit: Supervision Sit to supine: Supervision   General bed mobility comments: no assistance to sitting on bed edge, used belt to self assist the  right leg onto bed.   Transfers Overall transfer level: Needs assistance Equipment used: Rolling walker (2 wheeled) Transfers: Sit to/from [employer] Sit to Stand: Min guard Stand pivot transfers: Min guard       General transfer comment: patient's BP 78/41 in supine. Assisted to [facility] with RW and min guard.  Ambulation/Gait Ambulation/Gait assistance: Min guard Gait Distance (Feet): 15 Feet Assistive device: Rolling walker (2 wheeled) Gait Pattern/deviations: Step-through pattern Gait velocity: decr   General Gait Details: Patient not dizzy but limited ambulation x 10' in room after on [facility].   Stairs             Wheelchair Mobility    Modified Rankin (Stroke Patients Only)       Balance                                            Cognition Arousal/Alertness: Awake/alert Behavior During Therapy: WFL  for tasks assessed/performed Overall Cognitive Status: Within Functional Limits for tasks assessed                                 General Comments: reports feeling weak      Exercises Total Joint Exercises Ankle Circles/Pumps: AROM;Both;10 reps Quad Sets: AROM;Both;10 reps Short Arc Quad: AROM;Right;10 reps;Supine Heel Slides: AAROM;Right;10 reps;Supine Hip ABduction/ADduction: AAROM;Right;10 reps;Supine Long Arc Quad: AROM;Both;10 reps;Seated    General Comments        Pertinent Vitals/Pain Pain Assessment: 0-10 Pain Score: 2  Pain Location: right thigh Pain Descriptors / Indicators: Tightness Pain Intervention(s): Monitored during session;Premedicated before session;Limited activity within patient's tolerance;Repositioned;Ice applied    Home Living Family/patient expects to be discharged to:: Private residence Living Arrangements: Other relatives Available Help at Discharge: Family Type of Home: House Home Access: Stairs to enter Entrance Stairs-Rails: None Home Layout: One level Home Equipment: None      Prior Function Level of Independence: Independent          PT Goals (current goals can now be found in the care plan section) Acute Rehab PT Goals Patient Stated Goal: to go home PT Goal Formulation: With patient/family Time For Goal Achievement: 08/12/18 Potential to Achieve Goals: Good Progress towards PT goals: Progressing toward goals    Frequency    7X/week  PT Plan Current plan remains appropriate    Co-evaluation              AM-PAC PT "6 Clicks" Mobility   Outcome Measure  Help needed turning from your back to your side while in a flat bed without using bedrails?: A Little Help needed moving from lying on your back to sitting on the side of a flat bed without using bedrails?: A Little Help needed moving to and from a bed to a chair (including a wheelchair)?: A Little Help needed standing up from a chair using your  arms (e.g., wheelchair or bedside chair)?: A Little Help needed to walk in hospital room?: A Little Help needed climbing 3-5 steps with a railing? : A Lot 6 Click Score: 17    End of Session Equipment Utilized During Treatment: Gait belt Activity Tolerance: Treatment limited secondary to medical complications (Comment);Patient tolerated treatment well Patient left: in bed;with call bell/phone within reach;with family/visitor present;with bed alarm set Nurse Communication: Mobility status PT Visit Diagnosis: Unsteadiness on feet (R26.81)     Time: 0383-3383 PT Time Calculation (min) (ACUTE ONLY): 41 min  Charges:  $Gait Training: 8-22 mins $Therapeutic Exercise: 8-22 mins $Self Care/Home Management: 8-22                      Blanchard Kelch PT Acute Rehabilitation Services Pager [phone] Office [phone]   Rada Hay 08/05/2018, 3:18 PM

## 2018-08-05 NOTE — Care Management Obs Status (Signed)
MEDICARE OBSERVATION STATUS NOTIFICATION   Patient Details  Name: Colleen Cole MRN: [id number] Date of Birth: [birth year]   Medicare Observation Status Notification Given:  Yes    Golda Acre, RN 08/05/2018, 11:22 AM

## 2018-08-05 NOTE — Discharge Summary (Addendum)
Patient ID: Colleen Cole MRN: [id number] DOB/AGE: 1945/11/01 73 y.o.  Admit date: 08/04/2018 Discharge date: 08/06/2018 Admission Diagnoses:  Principal Problem:   Primary osteoarthritis of right hip   Discharge Diagnoses:  Same  Past Medical History:  Diagnosis Date  . Hypertension     Surgeries: Procedure(s): TOTAL HIP ARTHROPLASTY ANTERIOR APPROACH on 08/04/2018   Consultants:   Discharged Condition: Improved  Hospital Course: Brandlyn Paolo is an 73 y.o. female who was admitted 08/04/2018 for operative treatment ofPrimary osteoarthritis of right hip. Patient has severe unremitting pain that affects sleep, daily activities, and work/hobbies. After pre-op clearance the patient was taken to the operating room on 08/04/2018 and underwent  Procedure(s): TOTAL HIP ARTHROPLASTY ANTERIOR APPROACH.    Patient was given perioperative antibiotics:  Anti-infectives (From admission, onward)   Start     Dose/Rate Route Frequency Ordered Stop   08/04/18 2100  ceFAZolin (ANCEF) IVPB 2g/100 mL premix     2 g 200 mL/hr over 30 Minutes Intravenous Every 6 hours 08/04/18 1803 08/05/18 0232   08/04/18 1245  ceFAZolin (ANCEF) IVPB 2g/100 mL premix     2 g 200 mL/hr over 30 Minutes Intravenous On call to O.R. 08/04/18 1236 08/04/18 1518       Patient was given sequential compression devices, early ambulation, and chemoprophylaxis to prevent DVT.  Patient had some low BP yesterday so she ended up staying one more night to get better control. Of BP. Her meds were held and she was given a bolus of fluid and felt much better.  Patient benefited maximally from hospital stay and there were no complications.    Recent vital signs:  Patient Vitals for the past 24 hrs:  BP Temp Temp src Pulse Resp SpO2 Height Weight  08/05/18 0445 (!) 120/57 97.9 F (36.6 C) - 68 14 98 % - -  08/05/18 0111 (!) 114/57 98.1 F (36.7 C) Oral 66 16 96 % - -  08/04/18 2229 126/61 98.1 F (36.7 C) Oral 65 14 97 % -  -  08/04/18 2107 113/65 98.1 F (36.7 C) Oral 71 18 97 % - -  08/04/18 1946 (!) 119/59 (!) 97.5 F (36.4 C) Oral 69 16 98 % - -  08/04/18 1745 (!) 104/48 (!) 97.5 F (36.4 C) - 60 12 99 % - -  08/04/18 1730 (!) 120/52 - - 61 12 97 % - -  08/04/18 1715 (!) 133/105 - - 65 10 99 % - -  08/04/18 1700 (!) 116/56 - - 68 11 100 % - -  08/04/18 1645 (!) 107/46 - - 71 15 100 % - -  08/04/18 1630 (!) 89/62 - - 86 18 100 % - -  08/04/18 1628 107/61 (!) 97.5 F (36.4 C) - 92 10 100 % - -  08/04/18 1241 (!) 152/60 98.1 F (36.7 C) Oral 94 16 97 % 5\' 2"  (1.575 m) 82.7 kg     Recent laboratory studies: No results for input(s): WBC, HGB, HCT, PLT, NA, K, CL, CO2, BUN, CREATININE, GLUCOSE, INR, CALCIUM in the last 72 hours.  Invalid input(s): PT, 2   Discharge Medications:   Allergies as of 08/05/2018   No Known Allergies     Medication List    STOP taking these medications   celecoxib 200 MG capsule Commonly known as:  CELEBREX     TAKE these medications   acetaminophen 500 MG tablet Commonly known as:  TYLENOL Take 500 mg by mouth every 6 (six) hours  as needed for mild pain or headache.   amLODipine 5 MG tablet Commonly known as:  NORVASC Take 10 mg by mouth daily.   aspirin 325 MG EC tablet Take 1 tablet (325 mg total) by mouth 2 (two) times daily after a meal.   Calcium 600-200 MG-UNIT tablet Take 1 tablet by mouth 2 (two) times daily.   fluticasone 50 MCG/ACT nasal spray Commonly known as:  FLONASE Place 1 spray into both nostrils daily as needed for allergies or rhinitis.   hydrALAZINE 25 MG tablet Commonly known as:  APRESOLINE Take 25 mg by mouth 2 (two) times daily.   hydrochlorothiazide 25 MG tablet Commonly known as:  HYDRODIURIL Take 25 mg by mouth daily.   HYDROcodone-acetaminophen 5-325 MG tablet Commonly known as:  NORCO/VICODIN Take 1-2 tablets by mouth every 6 (six) hours as needed for moderate pain (pain score 4-6).   losartan 100 MG tablet Commonly  known as:  COZAAR Take 100 mg by mouth daily.   SENNA PO Take 1 tablet by mouth daily.   tiZANidine 4 MG tablet Commonly known as:  ZANAFLEX Take 1 tablet (4 mg total) by mouth every 6 (six) hours as needed for muscle spasms.   VITAMIN D PO Take 1 capsule by mouth daily.            Durable Medical Equipment  (From admission, onward)         Start     Ordered   08/04/18 1804  DME Walker rolling  Once    Question:  Patient needs a walker to treat with the following condition  Answer:  Primary osteoarthritis of right hip   08/04/18 1803   08/04/18 1804  DME 3 n 1  Once     08/04/18 1803   08/04/18 1804  DME Bedside commode  Once    Question:  Patient needs a bedside commode to treat with the following condition  Answer:  Primary osteoarthritis of right hip   08/04/18 1803          Diagnostic Studies: Dg Chest 2 View  Result Date: 07/30/2018 CLINICAL DATA:  Preop total hip replacement.  No chest complaints. EXAM: CHEST - 2 VIEW COMPARISON:  08/26/2011.  09/03/2006. FINDINGS: Mediastinum and hilar structures normal. Heart size normal. Aortic atherosclerotic vascular calcification. Small density noted the left lung base, most likely subsegmental atelectasis. Follow-up chest x-ray to demonstrate clearing suggested. No alveolar infiltrate. No pleural effusion or pneumothorax. Heart size normal. Degenerative changes and scoliosis thoracolumbar spine. No acute bony abnormality. IMPRESSION: 1. Small density left lung base, most likely subsegmental atelectasis. Follow-up chest x-ray to demonstrate clearing suggested. 2.  Aortic atherosclerotic vascular disease. Electronically Signed   By: Maisie Fushomas  Register   On: 07/30/2018 06:20   Dg C-arm 1-60 Min-no Report  Result Date: 08/04/2018 Fluoroscopy was utilized by the requesting physician.  No radiographic interpretation.   Dg Hip Operative Unilat With Pelvis Right  Result Date: 08/04/2018 CLINICAL DATA:  Right hip replacement EXAM:  OPERATIVE right HIP (WITH PELVIS IF PERFORMED) 2 VIEWS TECHNIQUE: Fluoroscopic spot image(s) were submitted for interpretation post-operatively. COMPARISON:  None. FINDINGS: Right hip replacement in satisfactory position and alignment. No acute complication. IMPRESSION: Satisfactory right hip replacement Electronically Signed   By: Marlan Palauharles  Clark M.D.   On: 08/04/2018 19:24    Disposition: Discharge disposition: 01-Home or Self Care       Discharge Instructions    Call MD / Call 911   Complete by:  As directed  If you experience chest pain or shortness of breath, CALL 911 and be transported to the hospital emergency room.  If you develope a fever above 101 F, pus (white drainage) or increased drainage or redness at the wound, or calf pain, call your surgeon's office.   Constipation Prevention   Complete by:  As directed    Drink plenty of fluids.  Prune juice may be helpful.  You may use a stool softener, such as Colace (over the counter) 100 mg twice a day.  Use MiraLax (over the counter) for constipation as needed.   Diet - low sodium heart healthy   Complete by:  As directed    Discharge instructions   Complete by:  As directed    INSTRUCTIONS AFTER JOINT REPLACEMENT   Remove items at home which could result in a fall. This includes throw rugs or furniture in walking pathways ICE to the affected joint every three hours while awake for 30 minutes at a time, for at least the first 3-5 days, and then as needed for pain and swelling.  Continue to use ice for pain and swelling. You may notice swelling that will progress down to the foot and ankle.  This is normal after surgery.  Elevate your leg when you are not up walking on it.   Continue to use the breathing machine you got in the hospital (incentive spirometer) which will help keep your temperature down.  It is common for your temperature to cycle up and down following surgery, especially at night when you are not up moving around and  exerting yourself.  The breathing machine keeps your lungs expanded and your temperature down.   DIET:  As you were doing prior to hospitalization, we recommend a well-balanced diet.  DRESSING / WOUND CARE / SHOWERING  You may shower 3 days after surgery, but keep the wounds dry during showering.  You may use an occlusive plastic wrap (Press'n Seal for example), NO SOAKING/SUBMERGING IN THE BATHTUB.  If the bandage gets wet, change with a clean dry gauze.  If the incision gets wet, pat the wound dry with a clean towel.  ACTIVITY  Increase activity slowly as tolerated, but follow the weight bearing instructions below.   No driving for 6 weeks or until further direction given by your physician.  You cannot drive while taking narcotics.  No lifting or carrying greater than 10 lbs. until further directed by your surgeon. Avoid periods of inactivity such as sitting longer than an hour when not asleep. This helps prevent blood clots.  You may return to work once you are authorized by your doctor.     WEIGHT BEARING   Weight bearing as tolerated with assist device (walker, cane, etc) as directed, use it as long as suggested by your surgeon or therapist, typically at least 4-6 weeks.   EXERCISES  Results after joint replacement surgery are often greatly improved when you follow the exercise, range of motion and muscle strengthening exercises prescribed by your doctor. Safety measures are also important to protect the joint from further injury. Any time any of these exercises cause you to have increased pain or swelling, decrease what you are doing until you are comfortable again and then slowly increase them. If you have problems or questions, call your caregiver or physical therapist for advice.   Rehabilitation is important following a joint replacement. After just a few days of immobilization, the muscles of the leg can become weakened and shrink (atrophy).  These  exercises are designed to  build up the tone and strength of the thigh and leg muscles and to improve motion. Often times heat used for twenty to thirty minutes before working out will loosen up your tissues and help with improving the range of motion but do not use heat for the first two weeks following surgery (sometimes heat can increase post-operative swelling).   These exercises can be done on a training (exercise) mat, on the floor, on a table or on a bed. Use whatever works the best and is most comfortable for you.    Use music or television while you are exercising so that the exercises are a pleasant break in your day. This will make your life better with the exercises acting as a break in your routine that you can look forward to.   Perform all exercises about fifteen times, three times per day or as directed.  You should exercise both the operative leg and the other leg as well.   Exercises include:   Quad Sets - Tighten up the muscle on the front of the thigh (Quad) and hold for 5-10 seconds.   Straight Leg Raises - With your knee straight (if you were given a brace, keep it on), lift the leg to 60 degrees, hold for 3 seconds, and slowly lower the leg.  Perform this exercise against resistance later as your leg gets stronger.  Leg Slides: Lying on your back, slowly slide your foot toward your buttocks, bending your knee up off the floor (only go as far as is comfortable). Then slowly slide your foot back down until your leg is flat on the floor again.  Angel Wings: Lying on your back spread your legs to the side as far apart as you can without causing discomfort.  Hamstring Strength:  Lying on your back, push your heel against the floor with your leg straight by tightening up the muscles of your buttocks.  Repeat, but this time bend your knee to a comfortable angle, and push your heel against the floor.  You may put a pillow under the heel to make it more comfortable if necessary.   A rehabilitation program following  joint replacement surgery can speed recovery and prevent re-injury in the future due to weakened muscles. Contact your doctor or a physical therapist for more information on knee rehabilitation.    CONSTIPATION  Constipation is defined medically as fewer than three stools per week and severe constipation as less than one stool per week.  Even if you have a regular bowel pattern at home, your normal regimen is likely to be disrupted due to multiple reasons following surgery.  Combination of anesthesia, postoperative narcotics, change in appetite and fluid intake all can affect your bowels.   YOU MUST use at least one of the following options; they are listed in order of increasing strength to get the job done.  They are all available over the counter, and you may need to use some, POSSIBLY even all of these options:    Drink plenty of fluids (prune juice may be helpful) and high fiber foods Colace 100 mg by mouth twice a day  Senokot for constipation as directed and as needed Dulcolax (bisacodyl), take with full glass of water  Miralax (polyethylene glycol) once or twice a day as needed.  If you have tried all these things and are unable to have a bowel movement in the first 3-4 days after surgery call either your surgeon or your primary  doctor.    If you experience loose stools or diarrhea, hold the medications until you stool forms back up.  If your symptoms do not get better within 1 week or if they get worse, check with your doctor.  If you experience "the worst abdominal pain ever" or develop nausea or vomiting, please contact the office immediately for further recommendations for treatment.   ITCHING:  If you experience itching with your medications, try taking only a single pain pill, or even half a pain pill at a time.  You can also use Benadryl over the counter for itching or also to help with sleep.   TED HOSE STOCKINGS:  Use stockings on both legs until for at least 2 weeks or as  directed by physician office. They may be removed at night for sleeping.  MEDICATIONS:  See your medication summary on the "After Visit Summary" that nursing will review with you.  You may have some home medications which will be placed on hold until you complete the course of blood thinner medication.  It is important for you to complete the blood thinner medication as prescribed.  PRECAUTIONS:  If you experience chest pain or shortness of breath - call 911 immediately for transfer to the hospital emergency department.   If you develop a fever greater that 101 F, purulent drainage from wound, increased redness or drainage from wound, foul odor from the wound/dressing, or calf pain - CONTACT YOUR SURGEON.                                                   FOLLOW-UP APPOINTMENTS:  If you do not already have a post-op appointment, please call the office for an appointment to be seen by your surgeon.  Guidelines for how soon to be seen are listed in your "After Visit Summary", but are typically between 1-4 weeks after surgery.  OTHER INSTRUCTIONS:   Knee Replacement:  Do not place pillow under knee, focus on keeping the knee straight while resting. CPM instructions: 0-90 degrees, 2 hours in the morning, 2 hours in the afternoon, and 2 hours in the evening. Place foam block, curve side up under heel at all times except when in CPM or when walking.  DO NOT modify, tear, cut, or change the foam block in any way.  MAKE SURE YOU:  Understand these instructions.  Get help right away if you are not doing well or get worse.    Thank you for letting us be a part of your medical care team.  It is a privilege we respect greatly.  We hope these instructions will help you stay on track for a fast and full recovery!   Increase activity slowly as tolerated   Complete by:  As directed       Follow-up Information    Marcene Corning, MD. Go on 08/14/2018.   Specialty:  Orthopedic Surgery Why:  Your appointment  has been made for 9:15 am  Contact information: [address] [place] [address] [phone]        [facility] Follow up.   Why:  You are scheduled to start Outpatient physical therapy on 08/07/18 @ 10:40. Please arrive at 10:20 to complete your paperwork  Contact information: Physical Therapy [address] [place] [address] [phone]        Jerl Santos,  Theron Arista, MD. Schedule an appointment as soon as possible for a visit in 2 weeks.   Specialty:  Orthopedic Surgery Contact information: [address] [place] [address] [phone]            Signed: Ginger Organ Darry Kelnhofer 08/05/2018, 7:46 AM

## 2018-08-05 NOTE — Progress Notes (Signed)
CSW consult-SNF Per Ortho Case Manager, the plan: to discharge to home with friends to assist. She is set up to go to OPPT at [facility] starting on 08/07/18. Patient is aware of plan and agreeable. Choice offered. Equipment ordered pre-op.  Vivi Barrack, [place], MSW Clinical Social Worker  [phone] 08/05/2018  10:53 AM

## 2018-08-05 NOTE — Progress Notes (Signed)
Subjective: 1 Day Post-Op Procedure(s) (LRB): TOTAL HIP ARTHROPLASTY ANTERIOR APPROACH (Right)   Patient doing well. She is looking forward to getting up with PT.  Activity level:  wbat Diet tolerance:  ok Voiding:  ok Patient reports pain as mild.    Objective: Vital signs in last 24 hours: Temp:  [97.5 F (36.4 C)-98.1 F (36.7 C)] 97.9 F (36.6 C) (03/04 0445) Pulse Rate:  [60-94] 68 (03/04 0445) Resp:  [10-18] 14 (03/04 0445) BP: (89-152)/(46-105) 120/57 (03/04 0445) SpO2:  [96 %-100 %] 98 % (03/04 0445) Weight:  [82.7 kg] 82.7 kg (03/03 1241)  Labs: No results for input(s): HGB in the last 72 hours. No results for input(s): WBC, RBC, HCT, PLT in the last 72 hours. No results for input(s): NA, K, CL, CO2, BUN, CREATININE, GLUCOSE, CALCIUM in the last 72 hours. No results for input(s): LABPT, INR in the last 72 hours.  Physical Exam:  Neurologically intact ABD soft Neurovascular intact Sensation intact distally Intact pulses distally Dorsiflexion/Plantar flexion intact Incision: dressing C/D/I and no drainage No cellulitis present Compartment soft  Assessment/Plan:  1 Day Post-Op Procedure(s) (LRB): TOTAL HIP ARTHROPLASTY ANTERIOR APPROACH (Right) Advance diet Up with therapy D/C IV fluids Discharge home with home health likely today if doing well and cleared by PT.  Continue on ASA 325mg  BID x 4 weeks post op. Follow up in office 2 weeks post op.  Ginger Organ Merrell Rettinger 08/05/2018, 7:40 AM

## 2018-08-06 NOTE — Progress Notes (Signed)
Subjective: 2 Days Post-Op Procedure(s) (LRB): TOTAL HIP ARTHROPLASTY ANTERIOR APPROACH (Right)   Patinet feels much better this morning. She had issues with low BP yesterday. She is looking forward to going home today.  Activity level:  wbat Diet tolerance:  ok Voiding:  ok Patient reports pain as mild.    Objective: Vital signs in last 24 hours: Temp:  [98.2 F (36.8 C)-99.7 F (37.6 C)] 98.8 F (37.1 C) (03/05 0433) Pulse Rate:  [68-91] 91 (03/05 0433) Resp:  [14-15] 14 (03/05 0433) BP: (83-122)/(43-53) 120/53 (03/05 0433) SpO2:  [86 %-92 %] 92 % (03/05 0500)  Labs: No results for input(s): HGB in the last 72 hours. No results for input(s): WBC, RBC, HCT, PLT in the last 72 hours. No results for input(s): NA, K, CL, CO2, BUN, CREATININE, GLUCOSE, CALCIUM in the last 72 hours. No results for input(s): LABPT, INR in the last 72 hours.  Physical Exam:  Neurologically intact ABD soft Neurovascular intact Sensation intact distally Intact pulses distally Dorsiflexion/Plantar flexion intact Incision: dressing C/D/I and no drainage No cellulitis present Compartment soft  Assessment/Plan:  2 Days Post-Op Procedure(s) (LRB): TOTAL HIP ARTHROPLASTY ANTERIOR APPROACH (Right) Advance diet Up with therapy D/C IV fluids Discharge home with home health today after PT. Hold BP meds this morning.  Continue on ASA 325mg  BID x 4 weeks post op. Follow up in office 2 weeks post op  Ginger Organ Naika Noto 08/06/2018, 6:54 AM

## 2018-08-06 NOTE — Progress Notes (Signed)
Physical Therapy Treatment Patient Details Name: Colleen Cole MRN: [id number] DOB: 03-Jan-1946 Today's Date: 08/06/2018    History of Present Illness R DATHA, hypotension after BP medication    PT Comments    POD # 1 am session. Assisted OOB. Demonstrated and instructed pt how to use belt to self assist LE as a leg lifter Assisted with amb.  General Gait Details: one VC safety with turns.  Practiced stairs.  General stair comments: practiced twice with daughter "hands on" assisted with 25% VC's on proper tech and sequencingh.  Then Then returned to room to perform some TE's following HEP handout.  Instructed on proper tech, freq as well as use of ICE.   Addressed all mobility questions, discussed appropriate activity, educated on use of ICE.  Pt ready for D/C to home.    Follow Up Recommendations  Follow surgeon's recommendation for DC plan and follow-up therapies;Outpatient PT     Equipment Recommendations  Rolling walker with 5" wheels    Recommendations for Other Services       Precautions / Restrictions Precautions Precautions: Fall Restrictions Weight Bearing Restrictions: No RLE Weight Bearing: Weight bearing as tolerated    Mobility  Bed Mobility Overal bed mobility: Modified Independent Bed Mobility: Supine to Sit           General bed mobility comments: self able using gait belt as leg lifter   Transfers Overall transfer level: Needs assistance Equipment used: Rolling walker (2 wheeled) Transfers: Sit to/from [employer] Transfers Sit to Stand: Supervision Stand pivot transfers: Supervision;Min guard       General transfer comment: one VC safety with turns   Ambulation/Gait Ambulation/Gait assistance: Min guard Gait Distance (Feet): 55 Feet Assistive device: Rolling walker (2 wheeled) Gait Pattern/deviations: Step-through pattern Gait velocity: decr   General Gait Details: one VC safety with turns    Stairs Stairs: Yes Stairs  assistance: Min guard;Min assist Stair Management: Step to pattern;Forwards;One rail Right Number of Stairs: 2 General stair comments: practiced twice with daughter "hands on" assisted with 25% VC's on proper tech and sequencingh   Wheelchair Mobility    Modified Rankin (Stroke Patients Only)       Balance                                            Cognition Arousal/Alertness: Awake/alert Behavior During Therapy: WFL for tasks assessed/performed Overall Cognitive Status: Within Functional Limits for tasks assessed                                 General Comments: feeling better      Exercises   Total Hip Replacement TE's 10 reps ankle pumps 10 reps knee presses 10 reps heel slides 10 reps SAQ's 10 reps ABD 10 reps LAQ's  Followed by ICE     General Comments        Pertinent Vitals/Pain Pain Assessment: 0-10 Pain Score: 3  Pain Location: right thigh Pain Descriptors / Indicators: Tightness;Tender;Sore Pain Intervention(s): Monitored during session;Repositioned;Premedicated before session;Ice applied    Home Living                      Prior Function            PT Goals (current goals can now be found in the  care plan section) Progress towards PT goals: Progressing toward goals    Frequency    7X/week      PT Plan Current plan remains appropriate    Co-evaluation              AM-PAC PT "6 Clicks" Mobility   Outcome Measure  Help needed turning from your back to your side while in a flat bed without using bedrails?: A Little Help needed moving from lying on your back to sitting on the side of a flat bed without using bedrails?: A Little Help needed moving to and from a bed to a chair (including a wheelchair)?: A Little Help needed standing up from a chair using your arms (e.g., wheelchair or bedside chair)?: A Little Help needed to walk in hospital room?: A Little Help needed climbing 3-5 steps  with a railing? : A Lot 6 Click Score: 17    End of Session Equipment Utilized During Treatment: Gait belt Activity Tolerance: Patient tolerated treatment well Patient left: with call bell/phone within reach;in bed;with family/visitor present Nurse Communication: (pt ready for D/C to home) PT Visit Diagnosis: Unsteadiness on feet (R26.81)     Time: 9518-8416 PT Time Calculation (min) (ACUTE ONLY): 39 min  Charges:  $Gait Training: 8-22 mins $Therapeutic Exercise: 8-22 mins $Therapeutic Activity: 8-22 mins                     Felecia Shelling  PTA Acute  Rehabilitation Services Pager      [phone] Office      [phone]

## 2018-08-06 NOTE — Plan of Care (Signed)
  Problem: Pain Managment: Goal: General experience of  Problem: Activity: Goal: Risk for activity intolerance will decrease Outcome: Progressing   Problem: Clinical Measurements: Goal: Will remain free from infection Outcome: Progressing   Problem: Clinical Measurements: Goal: Diagnostic test results will improve Outcome: Progressing   Problem: Clinical Measurements: Goal: Ability to maintain clinical measurements within normal limits will improve Outcome: Progressing  comfort will improve Outcome: Progressing

## 2018-08-06 NOTE — Plan of Care (Signed)
  Problem: Health Behavior/Discharge Planning: Goal: Ability to manage health-related needs will improve Outcome: Adequate for Discharge   Problem: Clinical Measurements: Goal: Ability to maintain clinical measurements within normal limits will improve Outcome: Adequate for Discharge Goal: Will remain free from infection Outcome: Adequate for Discharge Goal: Diagnostic test results will improve Outcome: Adequate for Discharge Goal: Respiratory complications will improve Outcome: Adequate for Discharge Goal: Cardiovascular complication will be avoided Outcome: Adequate for Discharge   Problem: Activity: Goal: Risk for activity intolerance will decrease Outcome: Adequate for Discharge   Problem: Nutrition: Goal: Adequate nutrition will be maintained Outcome: Adequate for Discharge   Problem: Coping: Goal: Level of anxiety will decrease Outcome: Adequate for Discharge   Problem: Elimination: Goal: Will not experience complications related to bowel motility Outcome: Adequate for Discharge   Problem: Pain Managment: Goal: General experience of comfort will improve Outcome: Adequate for Discharge   Problem: Safety: Goal: Ability to remain free from injury will improve Outcome: Adequate for Discharge   Problem: Skin Integrity: Goal: Risk for impaired skin integrity will decrease Outcome: Adequate for Discharge  Pt alert and oriented doing well this am.  Plan to d/c home per MD order.  Pain well controlled.

## 2018-11-03 ENCOUNTER — Other Ambulatory Visit: Payer: Self-pay | Admitting: Family Medicine

## 2018-11-03 DIAGNOSIS — Z1231 Encounter for screening mammogram for malignant neoplasm of breast: Secondary | ICD-10-CM

## 2018-11-24 ENCOUNTER — Ambulatory Visit
Admission: RE | Admit: 2018-11-24 | Discharge: 2018-11-24 | Disposition: A | Discharge status: Home / Self Care | Payer: Medicare Other | Source: Ambulatory Visit | Attending: Family Medicine | Admitting: Family Medicine

## 2018-11-24 ENCOUNTER — Other Ambulatory Visit: Payer: Self-pay

## 2018-11-24 DIAGNOSIS — Z1231 Encounter for screening mammogram for malignant neoplasm of breast: Secondary | ICD-10-CM | POA: Diagnosis not present

## 2019-02-11 ENCOUNTER — Ambulatory Visit
Admission: RE | Admit: 2019-02-11 | Discharge: 2019-02-11 | Disposition: A | Discharge status: Home / Self Care | Payer: Medicare Other | Source: Ambulatory Visit | Attending: Family Medicine | Admitting: Family Medicine

## 2019-02-11 ENCOUNTER — Other Ambulatory Visit: Payer: Self-pay | Admitting: Family Medicine

## 2019-02-11 DIAGNOSIS — R9389 Abnormal findings on diagnostic imaging of other specified body structures: Secondary | ICD-10-CM

## 2019-07-12 ENCOUNTER — Ambulatory Visit: Payer: Medicare Other

## 2019-09-08 ENCOUNTER — Other Ambulatory Visit: Payer: Self-pay | Admitting: Family Medicine

## 2019-09-08 DIAGNOSIS — M8580 Other specified disorders of bone density and structure, unspecified site: Secondary | ICD-10-CM

## 2019-09-08 DIAGNOSIS — Z1231 Encounter for screening mammogram for malignant neoplasm of breast: Secondary | ICD-10-CM

## 2019-11-30 ENCOUNTER — Ambulatory Visit
Admission: RE | Admit: 2019-11-30 | Discharge: 2019-11-30 | Disposition: A | Discharge status: Home / Self Care | Payer: Medicare Other | Source: Ambulatory Visit | Attending: Family Medicine | Admitting: Family Medicine

## 2019-11-30 ENCOUNTER — Other Ambulatory Visit: Payer: Self-pay

## 2019-11-30 DIAGNOSIS — M8580 Other specified disorders of bone density and structure, unspecified site: Secondary | ICD-10-CM

## 2019-11-30 DIAGNOSIS — Z1231 Encounter for screening mammogram for malignant neoplasm of breast: Secondary | ICD-10-CM

## 2019-12-24 ENCOUNTER — Encounter: Payer: Self-pay | Admitting: Cardiovascular Disease

## 2019-12-24 ENCOUNTER — Ambulatory Visit (INDEPENDENT_AMBULATORY_CARE_PROVIDER_SITE_OTHER): Payer: Medicare Other | Admitting: Cardiovascular Disease

## 2019-12-24 ENCOUNTER — Other Ambulatory Visit: Payer: Self-pay

## 2019-12-24 DIAGNOSIS — I10 Essential (primary) hypertension: Secondary | ICD-10-CM | POA: Insufficient documentation

## 2019-12-24 DIAGNOSIS — I480 Paroxysmal atrial fibrillation: Secondary | ICD-10-CM | POA: Diagnosis not present

## 2019-12-24 MED ORDER — XARELTO 20 MG PO TABS: 20.0000 mg | ORAL_TABLET | Freq: Every day | ORAL | 3 refills | Status: DC

## 2019-12-24 MED ORDER — METOPROLOL TARTRATE 25 MG PO TABS: 25.0000 mg | ORAL_TABLET | Freq: Two times a day (BID) | ORAL | 3 refills | Status: DC

## 2019-12-24 NOTE — Assessment & Plan Note (Signed)
Ms. Perazzo referred to me by Aliene Beams , MD for evaluation of PAF.  She apparently was down to [place] showing horses and developed presyncope and shortness of breath.  She was taken to the local emergency room where she was found to be in A. fib with RVR.  She was treated with IV metoprolol and converted.  Because her CHA2DSVASC2 score is 3, she was begun on Xarelto oral anticoagulation.  She said no recurrent episodes..  And then get a 2D 2D echo to further evaluate.

## 2019-12-24 NOTE — Patient Instructions (Signed)
Medication Instructions:   STOP ASPIRIN  *If you need a refill on your cardiac medications before your next appointment, please call your pharmacy*   Lab Work: If you have labs (blood work) drawn today and your tests are completely normal, you will receive your results only by: Marland Kitchen MyChart Message (if you have MyChart) OR . A paper copy in the mail If you have any lab test that is abnormal or we need to change your treatment, we will call you to review the results.   Testing/Procedures:  Your physician has requested that you have an echocardiogram. Echocardiography is a painless test that uses sound waves to create images of your heart. It provides your doctor with information about the size and shape of your heart and how well your heart's chambers and valves are working. This procedure takes approximately one hour. There are no restrictions for this procedure.[address]  CARDIAC CALCIUM SCORING AT [address]   Follow-Up: At [facility], you and your health needs are our priority.  As part of our continuing mission to provide you with exceptional heart care, we have created designated Provider Care Teams.  These Care Teams include your primary Cardiologist (physician) and Advanced Practice Providers (APPs -  Physician Assistants and Nurse Practitioners) who all work together to provide you with the care you need, when you need it.  We recommend signing up for the patient portal called "MyChart".  Sign up information is provided on this After Visit Summary.  MyChart is used to connect with patients for Virtual Visits (Telemedicine).  Patients are able to view lab/test results, encounter notes, upcoming appointments, etc.  Non-urgent messages can be sent to your provider as well.   To learn more about what you can do with MyChart, go to [email or web address].    Your next appointment:   6 month(s)  The format for your next appointment:   In  Person  Provider:   You may see Nanetta Batty MD or one of the following Advanced Practice Providers on your designated Care Team:    Corine Shelter, PA-C  [place], [place]  Edd Fabian, [place]

## 2019-12-24 NOTE — Assessment & Plan Note (Signed)
History of essential hypertension with blood pressure measured today at 116/62.  She is on hydralazine, hydrochlorothiazide and losartan.

## 2019-12-24 NOTE — Progress Notes (Signed)
12/24/2019 Colleen Cole   [birth year]  [id number]  Primary Physician Colleen Beams, MD Primary Cardiologist: Colleen Gess MD Colleen Cole, [place]  HPI:  Colleen Cole is a 74 y.o. 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  She was referred to me by Dr. Aliene Cole for an episode of PAF.  She basically has no cardiac risk factors other than hypertension and family history with a father who had CABG in his 34s.  She is never had heart attack or stroke.  She denies chest pain or shortness of breath pressure is very active works out with a trainer 2-3 times a week, plays golf and is shows horses.  She was done in [place] on 12/17/2019 and had episode of presyncope and shortness of breath.  She is taken to the emergency room which was found to be in A. fib with RVR.  She was treated with IV metoprolol and converted.  Her laboratory exam was unremarkable.  She said no recurrent symptoms.   Current Meds  Medication Sig  . acetaminophen (TYLENOL) 500 MG tablet Take 500 mg by mouth every 6 (six) hours as needed for mild pain or headache.  Marland Kitchen aspirin EC 325 MG EC tablet Take 1 tablet (325 mg total) by mouth 2 (two) times daily after a meal.  . Calcium 600-200 MG-UNIT tablet Take 1 tablet by mouth 2 (two) times daily.  . fluticasone (FLONASE) 50 MCG/ACT nasal spray Place 1 spray into both nostrils daily as needed for allergies or rhinitis.  . hydrALAZINE (APRESOLINE) 25 MG tablet Take 25 mg by mouth 2 (two) times daily.  . hydrochlorothiazide (HYDRODIURIL) 25 MG tablet Take 25 mg by mouth daily.  Marland Kitchen losartan (COZAAR) 100 MG tablet Take 100 mg by mouth daily.  . SENNA PO Take 1 tablet by mouth daily.  Marland Kitchen VITAMIN D PO Take 1 capsule by mouth daily.  . [DISCONTINUED] amLODipine (NORVASC) 5 MG tablet Take 10 mg by mouth daily.  . [DISCONTINUED]  HYDROcodone-acetaminophen (NORCO/VICODIN) 5-325 MG tablet Take 1-2 tablets by mouth every 6 (six) hours as needed for moderate pain (pain score 4-6).     No Known Allergies  Social History   Socioeconomic History  . Marital status: Single    Spouse name: Not on file  . Number of children: Not on file  . Years of education: Not on file  . Highest education level: Not on file  Occupational History  . Not on file  Tobacco Use  . Smoking status: Former Smoker    Types: Cigarettes    Quit date: 07/13/1998    Years since quitting: 21.4  . Smokeless tobacco: Never Used  Vaping Use  . Vaping Use: Never used  Substance and Sexual Activity  . Alcohol use: Yes    Comment: occassionally  . Drug use: Never  . Sexual activity: Not on file  Other Topics Concern  . Not on file  Social History Narrative  . Not on file   Social Determinants of Health   Financial Resource Strain:   . Difficulty of Paying Living Expenses:   Food Insecurity:   . Worried About Programme researcher, broadcasting/film/video in the Last Year:   . Barista in the Last Year:   Transportation Needs:   . Freight forwarder (Medical):   Marland Kitchen Lack of Transportation (Non-Medical):  Physical Activity:   . Days of Exercise per Week:   . Minutes of Exercise per Session:   Stress:   . Feeling of Stress :   Social Connections:   . Frequency of Communication with Friends and Family:   . Frequency of Social Gatherings with Friends and Family:   . Attends Religious Services:   . Active Member of Clubs or Organizations:   . Attends Banker Meetings:   Marland Kitchen Marital Status:   Intimate Partner Violence:   . Fear of Current or Ex-Partner:   . Emotionally Abused:   Marland Kitchen Physically Abused:   . Sexually Abused:      Review of Systems: General: negative for chills, fever, night sweats or weight changes.  Cardiovascular: negative for chest pain, dyspnea on exertion, edema, orthopnea, palpitations, paroxysmal nocturnal dyspnea or  shortness of breath Dermatological: negative for rash Respiratory: negative for cough or wheezing Urologic: negative for hematuria Abdominal: negative for nausea, vomiting, diarrhea, bright red blood per rectum, melena, or hematemesis Neurologic: negative for visual changes, syncope, or dizziness All other systems reviewed and are otherwise negative except as noted above.    Blood pressure (!) 116/62, pulse 60, height 5\' 2"  (1.575 m), weight 172 lb (78 kg).  General appearance: alert and no distress Neck: no adenopathy, no carotid bruit, no JVD, supple, symmetrical, trachea midline and thyroid not enlarged, symmetric, no tenderness/mass/nodules Lungs: clear to auscultation bilaterally Heart: regular rate and rhythm, S1, S2 normal, no murmur, click, rub or gallop Extremities: extremities normal, atraumatic, no cyanosis or edema Pulses: 2+ and symmetric Skin: Skin color, texture, turgor normal. No rashes or lesions Neurologic: Alert and oriented X 3, normal strength and tone. Normal symmetric reflexes. Normal coordination and gait  EKG sinus rhythm at 60 without ST or T wave changes.  I personally reviewed this EKG.  ASSESSMENT AND PLAN:   Essential hypertension History of essential hypertension with blood pressure measured today at 116/62.  She is on hydralazine, hydrochlorothiazide and losartan.  Paroxysmal atrial fibrillation [facility]) Colleen Cole referred to me by Colleen Cole , MD for evaluation of PAF.  She apparently was down to [place] and developed presyncope and shortness of breath.  She was taken to the local emergency room where she was found to be in A. fib with RVR.  She was treated with IV metoprolol and converted.  Because her CHA2DSVASC2 score is 3, she was begun on Xarelto oral anticoagulation.  She said no recurrent episodes..  And then get a 2D 2D echo to further evaluate.      [place] MD FACP,FACC,FAHA, [facility] 12/24/2019 4:01 PM

## 2020-01-11 ENCOUNTER — Other Ambulatory Visit: Payer: Self-pay

## 2020-01-11 ENCOUNTER — Ambulatory Visit
Admission: RE | Admit: 2020-01-11 | Discharge: 2020-01-11 | Disposition: A | Discharge status: Home / Self Care | Payer: Self-pay | Source: Ambulatory Visit | Attending: Cardiovascular Disease | Admitting: Cardiovascular Disease

## 2020-01-11 ENCOUNTER — Ambulatory Visit (HOSPITAL_COMMUNITY): Payer: Medicare Other | Attending: Cardiology

## 2020-01-11 DIAGNOSIS — I480 Paroxysmal atrial fibrillation: Secondary | ICD-10-CM | POA: Diagnosis not present

## 2020-01-11 LAB — ECHOCARDIOGRAM COMPLETE
Area-P 1/2: 3.74 cm2
S' Lateral: 2.6 cm

## 2020-01-14 ENCOUNTER — Other Ambulatory Visit: Payer: Self-pay

## 2020-01-14 DIAGNOSIS — R931 Abnormal findings on diagnostic imaging of heart and coronary circulation: Secondary | ICD-10-CM

## 2020-01-14 DIAGNOSIS — I10 Essential (primary) hypertension: Secondary | ICD-10-CM

## 2020-01-17 LAB — LIPID PANEL
Chol/HDL Ratio: 2.7 ratio (ref 0.0–4.4)
Cholesterol, Total: 178 mg/dL (ref 100–199)
HDL: 66 mg/dL (ref >39)
LDL Chol Calc (NIH): 97 mg/dL (ref 0–99)
Triglycerides: 83 mg/dL (ref 0–149)
VLDL Cholesterol Cal: 15 mg/dL (ref 5–40)

## 2020-01-31 ENCOUNTER — Telehealth: Payer: Self-pay | Admitting: Cardiovascular Disease

## 2020-01-31 NOTE — Telephone Encounter (Signed)
Returned the call to the patient. She stated that she was possibly exposed to covid from a friend. The test results have not been finalized yet.   She has been vaccinated and is currently asymptomatic. She has an appointment on 9/1 with Dr. Allyson Sabal and wants to know if she should make it virtual, reschedule or come as planned.

## 2020-01-31 NOTE — Telephone Encounter (Signed)
Patient was exposed to covid. She has no symptoms and is fully vaccinated. If recommended that she does not come to her appointment on 02/02/20 with Dr. Allyson Sabal, she wants to know if she can do it virtually.

## 2020-02-01 NOTE — Telephone Encounter (Signed)
Spoke with patient regarding 6-8 week appointment ordered by Dr. Briscoe Burns is scheduled for 03/21/20 ---patient is aware.  Will mail informatoin to patient.

## 2020-02-01 NOTE — Telephone Encounter (Signed)
Reschedule for 6-8 weeks from now

## 2020-02-01 NOTE — Telephone Encounter (Signed)
The patient has been made aware. Message sent to scheduling. 

## 2020-02-02 ENCOUNTER — Ambulatory Visit: Payer: Medicare Other | Admitting: Cardiovascular Disease

## 2020-03-14 ENCOUNTER — Ambulatory Visit: Payer: Medicare Other | Admitting: Cardiovascular Disease

## 2020-03-21 ENCOUNTER — Ambulatory Visit (INDEPENDENT_AMBULATORY_CARE_PROVIDER_SITE_OTHER): Payer: Medicare Other | Admitting: Cardiovascular Disease

## 2020-03-21 ENCOUNTER — Other Ambulatory Visit: Payer: Self-pay

## 2020-03-21 ENCOUNTER — Encounter: Payer: Self-pay | Admitting: Cardiovascular Disease

## 2020-03-21 DIAGNOSIS — I10 Essential (primary) hypertension: Secondary | ICD-10-CM

## 2020-03-21 DIAGNOSIS — I480 Paroxysmal atrial fibrillation: Secondary | ICD-10-CM | POA: Diagnosis not present

## 2020-03-21 DIAGNOSIS — R0989 Other specified symptoms and signs involving the circulatory and respiratory systems: Secondary | ICD-10-CM | POA: Diagnosis not present

## 2020-03-21 DIAGNOSIS — R931 Abnormal findings on diagnostic imaging of heart and coronary circulation: Secondary | ICD-10-CM | POA: Insufficient documentation

## 2020-03-21 MED ORDER — ATORVASTATIN CALCIUM 20 MG PO TABS: 20.0000 mg | ORAL_TABLET | Freq: Every day | ORAL | 3 refills | Status: DC

## 2020-03-21 NOTE — Progress Notes (Signed)
03/21/2020 Darrow Bussing   1946-05-07  [id number]  Primary Physician Aliene Beams, MD Primary Cardiologist: Runell Gess MD Nicholes Calamity, [place]  HPI:  Colleen Cole is a 74 y.o.  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  She was referred to me by Dr. Aliene Beams for an episode of PAF.  I last saw her in the office 12/24/2019. She basically has no cardiac risk factors other than hypertension and family history with a father who had CABG in his 56s.  She is never had heart attack or stroke.  She denies chest pain or shortness of breath pressure is very active works out with a trainer 2-3 times a week, plays golf and is shows horses.  She was done in [place] on 12/17/2019 and had episode of presyncope and shortness of breath.  She is taken to the emergency room which was found to be in A. fib with RVR.  She was treated with IV metoprolol and converted.  Her laboratory exam was unremarkable.  She said no recurrent symptoms.  Since I saw her in the office I did get a 2D echo cardiogram on 01/11/2020 which was entirely normal.  Coronary calcium score performed 01/11/2020 was 711.     Current Meds  Medication Sig  . acetaminophen (TYLENOL) 500 MG tablet Take 500 mg by mouth every 6 (six) hours as needed for mild pain or headache.  . alendronate (FOSAMAX) 70 MG tablet Take 70 mg by mouth once a week.  Marland Kitchen amLODipine (NORVASC) 5 MG tablet Take 10 mg by mouth daily.  . Calcium 600-200 MG-UNIT tablet Take 1 tablet by mouth 2 (two) times daily.  . fluticasone (FLONASE) 50 MCG/ACT nasal spray Place 1 spray into both nostrils daily as needed for allergies or rhinitis.  . hydrALAZINE (APRESOLINE) 25 MG tablet Take 25 mg by mouth 2 (two) times daily.  . hydrochlorothiazide (HYDRODIURIL) 25 MG tablet Take 25 mg by mouth daily.  Marland Kitchen losartan (COZAAR) 100 MG tablet  Take 100 mg by mouth daily.  . metoprolol tartrate (LOPRESSOR) 25 MG tablet Take 1 tablet (25 mg total) by mouth 2 (two) times daily.  . SENNA PO Take 1 tablet by mouth daily.  Marland Kitchen VITAMIN D PO Take 1 capsule by mouth daily.  Carlena Hurl 20 MG TABS tablet Take 1 tablet (20 mg total) by mouth daily.     No Known Allergies  Social History   Socioeconomic History  . Marital status: Single    Spouse name: Not on file  . Number of children: Not on file  . Years of education: Not on file  . Highest education level: Not on file  Occupational History  . Not on file  Tobacco Use  . Smoking status: Former Smoker    Types: Cigarettes    Quit date: 07/13/1998    Years since quitting: 21.7  . Smokeless tobacco: Never Used  Vaping Use  . Vaping Use: Never used  Substance and Sexual Activity  . Alcohol use: Yes    Comment: occassionally  . Drug use: Never  . Sexual activity: Not on file  Other Topics Concern  . Not on file  Social History Narrative  . Not on file   Social Determinants of Health   Financial Resource Strain:   . Difficulty of Paying Living Expenses: Not on file  Food Insecurity:   .  Worried About Programme researcher, broadcasting/film/video in the Last Year: Not on file  . Ran Out of Food in the Last Year: Not on file  Transportation Needs:   . Lack of Transportation (Medical): Not on file  . Lack of Transportation (Non-Medical): Not on file  Physical Activity:   . Days of Exercise per Week: Not on file  . Minutes of Exercise per Session: Not on file  Stress:   . Feeling of Stress : Not on file  Social Connections:   . Frequency of Communication with Friends and Family: Not on file  . Frequency of Social Gatherings with Friends and Family: Not on file  . Attends Religious Services: Not on file  . Active Member of Clubs or Organizations: Not on file  . Attends Banker Meetings: Not on file  . Marital Status: Not on file  Intimate Partner Violence:   . Fear of Current or  Ex-Partner: Not on file  . Emotionally Abused: Not on file  . Physically Abused: Not on file  . Sexually Abused: Not on file     Review of Systems: General: negative for chills, fever, night sweats or weight changes.  Cardiovascular: negative for chest pain, dyspnea on exertion, edema, orthopnea, palpitations, paroxysmal nocturnal dyspnea or shortness of breath Dermatological: negative for rash Respiratory: negative for cough or wheezing Urologic: negative for hematuria Abdominal: negative for nausea, vomiting, diarrhea, bright red blood per rectum, melena, or hematemesis Neurologic: negative for visual changes, syncope, or dizziness All other systems reviewed and are otherwise negative except as noted above.    Blood pressure 118/62, pulse (!) 58, height 5\' 2"  (1.575 m), weight 180 lb 6.4 oz (81.8 kg), SpO2 95 %.  General appearance: alert and no distress Neck: no adenopathy, no JVD, supple, symmetrical, trachea midline, thyroid not enlarged, symmetric, no tenderness/mass/nodules and Soft right carotid bruit Lungs: clear to auscultation bilaterally Heart: regular rate and rhythm, S1, S2 normal, no murmur, click, rub or gallop Extremities: extremities normal, atraumatic, no cyanosis or edema Pulses: 2+ and symmetric Skin: Skin color, texture, turgor normal. No rashes or lesions Neurologic: Alert and oriented X 3, normal strength and tone. Normal symmetric reflexes. Normal coordination and gait  EKG not performed today  ASSESSMENT AND PLAN:   Carotid bruit We will check carotid Doppler studies.  Paroxysmal atrial fibrillation (HCC) History of PAF maintaining sinus rhythm on Xarelto oral anticoagulation.  Essential hypertension History of essential hypertension blood pressure measured today at 118/62.  She is on amlodipine, hydralazine, metoprolol, losartan and hydrochlorothiazide.  Elevated coronary artery calcium score Coronary calcium score performed 01/11/2020 was 711.   Based on this I am going to begin her on a low-dose statin since her LDL recently measured 01/17/2020 was 97.  My goal is to get her down less than 70.      01/19/2020 MD FACP,FACC,FAHA, [facility] 03/21/2020 10:20 AM

## 2020-03-21 NOTE — Assessment & Plan Note (Signed)
History of PAF maintaining sinus rhythm on Xarelto oral anticoagulation. °

## 2020-03-21 NOTE — Assessment & Plan Note (Signed)
Coronary calcium score performed 01/11/2020 was 711.  Based on this I am going to begin her on a low-dose statin since her LDL recently measured 01/17/2020 was 97.  My goal is to get her down less than 70.

## 2020-03-21 NOTE — Assessment & Plan Note (Signed)
History of essential hypertension blood pressure measured today at 118/62.  She is on amlodipine, hydralazine, metoprolol, losartan and hydrochlorothiazide.

## 2020-03-21 NOTE — Patient Instructions (Addendum)
Medication Instructions:   START ATORVASTATIN 10 MG DAILY  *If you need a refill on your cardiac medications before your next appointment, please call your pharmacy*  Lab Work: Your physician recommends that you return for lab work in: 2 months   Lipid panel   Liver function test   If you have labs (blood work) drawn today and your tests are completely normal, you will receive your results only by: Marland Kitchen MyChart Message (if you have MyChart) OR . A paper copy in the mail If you have any lab test that is abnormal or we need to change your treatment, we will call you to review the results.  Testing/Procedures: Your physician has requested that you have a carotid duplex. This test is an ultrasound of the carotid arteries in your neck. It looks at blood flow through these arteries that supply the brain with blood. Allow one hour for this exam. There are no restrictions or special instructions.   Please schedule 1 week    Follow-Up: At [facility], you and your health needs are our priority.  As part of our continuing mission to provide you with exceptional heart care, we have created designated Provider Care Teams.  These Care Teams include your primary Cardiologist (physician) and Advanced Practice Providers (APPs -  Physician Assistants and Nurse Practitioners) who all work together to provide you with the care you need, when you need it.  We recommend signing up for the patient portal called "MyChart".  Sign up information is provided on this After Visit Summary.  MyChart is used to connect with patients for Virtual Visits (Telemedicine).  Patients are able to view lab/test results, encounter notes, upcoming appointments, etc.  Non-urgent messages can be sent to your provider as well.   To learn more about what you can do with MyChart, go to [email or web address].    Your next appointment:   6 month(s)  The format for your next appointment:   In Person  Provider:   Nanetta Batty, MD  Other Instructions

## 2020-03-21 NOTE — Assessment & Plan Note (Signed)
We will check carotid Doppler studies 

## 2020-03-24 ENCOUNTER — Other Ambulatory Visit: Payer: Self-pay

## 2020-03-24 ENCOUNTER — Ambulatory Visit (HOSPITAL_COMMUNITY)
Admission: RE | Admit: 2020-03-24 | Discharge: 2020-03-24 | Disposition: A | Discharge status: Home / Self Care | Payer: Medicare Other | Source: Ambulatory Visit | Attending: Cardiology | Admitting: Cardiology

## 2020-03-24 DIAGNOSIS — R0989 Other specified symptoms and signs involving the circulatory and respiratory systems: Secondary | ICD-10-CM | POA: Diagnosis present

## 2020-04-26 ENCOUNTER — Telehealth: Payer: Self-pay | Admitting: *Deleted

## 2020-04-26 NOTE — Telephone Encounter (Signed)
Patient with diagnosis of ATRIAL FIBRILLATION on XARELTO for anticoagulation.    Procedure: COLONOSCOPY Date of procedure: TBD    CHA2DS2-VASc Score = 3  This indicates a 3.2% annual risk of stroke. The patient's score is based upon: CHF History: 0 HTN History: 1 Diabetes History: 0 Stroke History: 0 Vascular Disease History: 0 Age Score: 1 Gender Score: 1   CrCl  = 82 ml/min Platelet count = 225  Per office protocol, patient can hold XARELTO for 2 days prior to procedure. Resume as directed by MD performing procedure.

## 2020-04-26 NOTE — Telephone Encounter (Signed)
   Primary Cardiologist: Dr. Allyson Sabal  Chart reviewed as part of pre-operative protocol coverage. Patient was recently seen by Dr. Allyson Sabal on 03/21/2020. Patient was contacted today for further pre-op evaluation and reported doing well since last visit. She denied any chest pain, shortness of breath, orthopnea, PND, palpitations, dizziness (expect for brief episodes if she changes positions too quickly), or syncope. Able to complete >4.0 METS without any problems. Given past medical history and time since last visit, based on ACC/AHA guidelines, Colleen Cole would be at acceptable risk for the planned procedure without further cardiovascular testing.   Per Pharmacy and office protocol, patient can hold Xarelto for 2 days prior to procedure. This should be restarted as soon as possible following procedure.  I will route this recommendation to the requesting party via Epic fax function and remove from pre-op pool.  Please call with questions.  Corrin Parker, PA-C 04/26/2020, 1:36 PM

## 2020-04-26 NOTE — Telephone Encounter (Signed)
   [facility] Pre-operative Risk Assessment     Request for surgical clearance:  1. What type of surgery is being performed? COLONOSCOPY    2. When is this surgery scheduled? TBD   3. What type of clearance is required (medical clearance vs. Pharmacy clearance to hold med vs. Both)?BOTH    4. Are there any medications that need to be held prior to surgery and how long?   XARELTO FOR 3 DAYS, PATIENT HAS H/O POLYPS  5. Practice name and name of physician performing surgery? DR Therisa Doyne [facility]   6. What is the office phone number? [phone]   7.   What is the office fax number? [phone]  8.   Anesthesia type (None, local, MAC, general) ? PROPOFOL

## 2020-04-26 NOTE — Telephone Encounter (Signed)
Pharmacy, can you please comment on how long patient can hold Xarelto for upcoming colonoscopy?  Thank you!

## 2020-05-17 ENCOUNTER — Other Ambulatory Visit: Payer: Self-pay

## 2020-05-17 DIAGNOSIS — R931 Abnormal findings on diagnostic imaging of heart and coronary circulation: Secondary | ICD-10-CM

## 2020-05-17 DIAGNOSIS — I10 Essential (primary) hypertension: Secondary | ICD-10-CM

## 2020-05-17 LAB — LIPID PANEL
Chol/HDL Ratio: 2 ratio (ref 0.0–4.4)
Cholesterol, Total: 136 mg/dL (ref 100–199)
HDL: 67 mg/dL (ref >39)
LDL Chol Calc (NIH): 55 mg/dL (ref 0–99)
Triglycerides: 69 mg/dL (ref 0–149)
VLDL Cholesterol Cal: 14 mg/dL (ref 5–40)

## 2020-05-17 LAB — HEPATIC FUNCTION PANEL
ALT: 16 IU/L (ref 0–32)
AST: 24 IU/L (ref 0–40)
Albumin: 4.3 g/dL (ref 3.7–4.7)
Alkaline Phosphatase: 67 IU/L (ref 44–121)
Bilirubin Total: 0.5 mg/dL (ref 0.0–1.2)
Bilirubin, Direct: 0.18 mg/dL (ref 0.00–0.40)
Total Protein: 6.8 g/dL (ref 6.0–8.5)

## 2020-06-13 ENCOUNTER — Encounter: Payer: Self-pay | Admitting: Cardiovascular Disease

## 2020-06-13 ENCOUNTER — Other Ambulatory Visit: Payer: Self-pay

## 2020-06-13 ENCOUNTER — Ambulatory Visit (INDEPENDENT_AMBULATORY_CARE_PROVIDER_SITE_OTHER): Payer: Medicare Other | Admitting: Cardiovascular Disease

## 2020-06-13 DIAGNOSIS — R0989 Other specified symptoms and signs involving the circulatory and respiratory systems: Secondary | ICD-10-CM

## 2020-06-13 DIAGNOSIS — I480 Paroxysmal atrial fibrillation: Secondary | ICD-10-CM

## 2020-06-13 DIAGNOSIS — I10 Essential (primary) hypertension: Secondary | ICD-10-CM

## 2020-06-13 DIAGNOSIS — R931 Abnormal findings on diagnostic imaging of heart and coronary circulation: Secondary | ICD-10-CM

## 2020-06-13 NOTE — Assessment & Plan Note (Signed)
History of PAF maintaining sinus rhythm on Xarelto oral anticoagulation. °

## 2020-06-13 NOTE — Assessment & Plan Note (Signed)
History of essential hypertension a blood pressure measured today at 126/52.  She is on amlodipine, hydralazine, hydrochlorothiazide, losartan and metoprolol.

## 2020-06-13 NOTE — Progress Notes (Signed)
06/13/2020 Colleen Cole   1946/04/13  [id number]  Primary Physician Aliene Beams, MD Primary Cardiologist: Runell Gess MD Nicholes Calamity, [place]  HPI:  Colleen Cole is a 75 y.o.    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 She was referred to me by Dr. Aliene Beams for an episode of PAF.  I last saw her in the office 03/21/2020.She basically has no cardiac risk factors other than hypertension and family history with a father who had CABG in his 70s. She is never had heart attack or stroke. She denies chest pain or shortness of breath pressure is very active works out with a trainer 2-3 times a week, plays golf and is shows horses. She was done in [place] on 12/17/2019 and had episode of presyncope and shortness of breath. She is taken to the emergency room which was found to be in A. fib with RVR. She was treated with IV metoprolol and converted. Her laboratory exam was unremarkable. She said no recurrent symptoms.  A 2D echocardiogram was performed on 01/11/2020 which was entirely normal.  Coronary calcium score performed 01/11/2020 was 711.  Based on this, I did start atorvastatin 20 mg a day which reduced her LDL from 97 down to 55.  She is completely asymptomatic.   Current Meds  Medication Sig  . acetaminophen (TYLENOL) 500 MG tablet Take 500 mg by mouth every 6 (six) hours as needed for mild pain or headache.  . alendronate (FOSAMAX) 70 MG tablet Take 70 mg by mouth once a week.  Marland Kitchen amLODipine (NORVASC) 5 MG tablet Take 10 mg by mouth daily.  Marland Kitchen atorvastatin (LIPITOR) 20 MG tablet Take 1 tablet (20 mg total) by mouth daily.  . Calcium 600-200 MG-UNIT tablet Take 1 tablet by mouth 2 (two) times daily.  . fluticasone (FLONASE) 50 MCG/ACT nasal spray Place 1 spray into both nostrils daily as needed for allergies or rhinitis.  . hydrALAZINE  (APRESOLINE) 25 MG tablet Take 25 mg by mouth 2 (two) times daily.  . hydrochlorothiazide (HYDRODIURIL) 25 MG tablet Take 25 mg by mouth daily.  Marland Kitchen losartan (COZAAR) 100 MG tablet Take 100 mg by mouth daily.  . metoprolol tartrate (LOPRESSOR) 25 MG tablet Take 1 tablet (25 mg total) by mouth 2 (two) times daily.  . SENNA PO Take 1 tablet by mouth daily.  Marland Kitchen VITAMIN D PO Take 1 capsule by mouth daily.  Carlena Hurl 20 MG TABS tablet Take 1 tablet (20 mg total) by mouth daily.     No Known Allergies  Social History   Socioeconomic History  . Marital status: Single    Spouse name: Not on file  . Number of children: Not on file  . Years of education: Not on file  . Highest education level: Not on file  Occupational History  . Not on file  Tobacco Use  . Smoking status: Former Smoker    Types: Cigarettes    Quit date: 07/13/1998    Years since quitting: 21.9  . Smokeless tobacco: Never Used  Vaping Use  . Vaping Use: Never used  Substance and Sexual Activity  . Alcohol use: Yes    Comment: occassionally  . Drug use: Never  . Sexual activity: Not on file  Other Topics Concern  . Not on file  Social History Narrative  . Not on file  Social Determinants of Health   Financial Resource Strain: Not on file  Food Insecurity: Not on file  Transportation Needs: Not on file  Physical Activity: Not on file  Stress: Not on file  Social Connections: Not on file  Intimate Partner Violence: Not on file     Review of Systems: General: negative for chills, fever, night sweats or weight changes.  Cardiovascular: negative for chest pain, dyspnea on exertion, edema, orthopnea, palpitations, paroxysmal nocturnal dyspnea or shortness of breath Dermatological: negative for rash Respiratory: negative for cough or wheezing Urologic: negative for hematuria Abdominal: negative for nausea, vomiting, diarrhea, bright red blood per rectum, melena, or hematemesis Neurologic: negative for visual  changes, syncope, or dizziness All other systems reviewed and are otherwise negative except as noted above.    Blood pressure (!) 126/52, pulse (!) 58, height 5\' 2"  (1.575 m), weight 183 lb (83 kg).  General appearance: alert and no distress Neck: no adenopathy, no JVD, supple, symmetrical, trachea midline, thyroid not enlarged, symmetric, no tenderness/mass/nodules and Soft right carotid bruit Lungs: clear to auscultation bilaterally Heart: regular rate and rhythm, S1, S2 normal, no murmur, click, rub or gallop Extremities: extremities normal, atraumatic, no cyanosis or edema Pulses: 2+ and symmetric Skin: Skin color, texture, turgor normal. No rashes or lesions Neurologic: Alert and oriented X 3, normal strength and tone. Normal symmetric reflexes. Normal coordination and gait  EKG sinus bradycardia 58 without ST or T wave changes.  I personally reviewed this EKG.  ASSESSMENT AND PLAN:   Paroxysmal atrial fibrillation (HCC) History of PAF maintaining sinus rhythm on Xarelto oral anticoagulation.  Essential hypertension History of essential hypertension a blood pressure measured today at 126/52.  She is on amlodipine, hydralazine, hydrochlorothiazide, losartan and metoprolol.  Elevated coronary artery calcium score History of elevated coronary calcium with a coronary calcium score of 711 measured on 01/11/2020.  Based on this I did begin her on atorvastatin 20 mg a day which reduced her LDL from 97 down to 55.  Carotid bruit Right carotid bruit with carotid Doppler performed 03/24/2020 revealing no evidence of ICA stenosis.      03/26/2020 MD FACP,FACC,FAHA, [facility] 06/13/2020 9:29 AM

## 2020-06-13 NOTE — Assessment & Plan Note (Signed)
History of elevated coronary calcium with a coronary calcium score of 711 measured on 01/11/2020.  Based on this I did begin her on atorvastatin 20 mg a day which reduced her LDL from 97 down to 55.

## 2020-06-13 NOTE — Assessment & Plan Note (Signed)
Right carotid bruit with carotid Doppler performed 03/24/2020 revealing no evidence of ICA stenosis.

## 2020-06-13 NOTE — Patient Instructions (Addendum)
Medication Instructions:  Your physician recommends that you continue on your current medications as directed. Please refer to the Current Medication list given to you today.  *If you need a refill on your cardiac medications before your next appointment, please call your pharmacy*   Follow-Up: At [facility], you and your health needs are our priority.  As part of our continuing mission to provide you with exceptional heart care, we have created designated Provider Care Teams.  These Care Teams include your primary Cardiologist (physician) and Advanced Practice Providers (APPs -  Physician Assistants and Nurse Practitioners) who all work together to provide you with the care you need, when you need it.  We recommend signing up for the patient portal called "MyChart".  Sign up information is provided on this After Visit Summary.  MyChart is used to connect with patients for Virtual Visits (Telemedicine).  Patients are able to view lab/test results, encounter notes, upcoming appointments, etc.  Non-urgent messages can be sent to your provider as well.   To learn more about what you can do with MyChart, go to [email or web address].    Your next appointment:   12 month(s)  The format for your next appointment:   In Person  Provider:   Nanetta Batty, MD   Heart-Healthy Eating Plan Heart-healthy meal planning includes:  Eating less unhealthy fats.  Eating more healthy fats.  Making other changes in your diet. Talk with your doctor or a diet specialist (dietitian) to create an eating plan that is right for you. What is my plan? Your doctor may recommend an eating plan that includes:  Total fat: ______% or less of total calories a day.  Saturated fat: ______% or less of total calories a day.  Cholesterol: less than _________mg a day. What are tips for following this plan? Cooking Avoid frying your food. Try to bake, boil, grill, or broil it instead. You can also reduce fat  by:  Removing the skin from poultry.  Removing all visible fats from meats.  Steaming vegetables in water or broth. Meal planning  At meals, divide your plate into four equal parts: ? Fill one-half of your plate with vegetables and green salads. ? Fill one-fourth of your plate with whole grains. ? Fill one-fourth of your plate with lean protein foods.  Eat 4-5 servings of vegetables per day. A serving of vegetables is: ? 1 cup of raw or cooked vegetables. ? 2 cups of raw leafy greens.  Eat 4-5 servings of fruit per day. A serving of fruit is: ? 1 medium whole fruit. ?  cup of dried fruit. ?  cup of fresh, frozen, or canned fruit. ?  cup of 100% fruit juice.  Eat more foods that have soluble fiber. These are apples, broccoli, carrots, beans, peas, and barley. Try to get 20-30 g of fiber per day.  Eat 4-5 servings of nuts, legumes, and seeds per week: ? 1 serving of dried beans or legumes equals  cup after being cooked. ? 1 serving of nuts is  cup. ? 1 serving of seeds equals 1 tablespoon.   General information  Eat more home-cooked food. Eat less restaurant, buffet, and fast food.  Limit or avoid alcohol.  Limit foods that are high in starch and sugar.  Avoid fried foods.  Lose weight if you are overweight.  Keep track of how much salt (sodium) you eat. This is important if you have high blood pressure. Ask your doctor to tell you more about this.  Try to add vegetarian meals each week. Fats  Choose healthy fats. These include olive oil and canola oil, flaxseeds, walnuts, almonds, and seeds.  Eat more omega-3 fats. These include salmon, mackerel, sardines, tuna, flaxseed oil, and ground flaxseeds. Try to eat fish at least 2 times each week.  Check food labels. Avoid foods with trans fats or high amounts of saturated fat.  Limit saturated fats. ? These are often found in animal products, such as meats, butter, and cream. ? These are also found in plant foods,  such as palm oil, palm kernel oil, and coconut oil.  Avoid foods with partially hydrogenated oils in them. These have trans fats. Examples are stick margarine, some tub margarines, cookies, crackers, and other baked goods. What foods can I eat? Fruits All fresh, canned (in natural juice), or frozen fruits. Vegetables Fresh or frozen vegetables (raw, steamed, roasted, or grilled). Green salads. Grains Most grains. Choose whole wheat and whole grains most of the time. Rice and pasta, including brown rice and pastas made with whole wheat. Meats and other proteins Lean, well-trimmed beef, veal, pork, and lamb. Chicken and [place] without skin. All fish and shellfish. Wild duck, rabbit, pheasant, and venison. Egg whites or low-cholesterol egg substitutes. Dried beans, peas, lentils, and tofu. Seeds and most nuts. Dairy Low-fat or nonfat cheeses, including ricotta and mozzarella. Skim or 1% milk that is liquid, powdered, or evaporated. Buttermilk that is made with low-fat milk. Nonfat or low-fat yogurt. Fats and oils Non-hydrogenated (trans-free) margarines. Vegetable oils, including soybean, sesame, sunflower, olive, peanut, safflower, corn, canola, and cottonseed. Salad dressings or mayonnaise made with a vegetable oil. Beverages Mineral water. Coffee and tea. Diet carbonated beverages. Sweets and desserts Sherbet, gelatin, and fruit ice. Small amounts of dark chocolate. Limit all sweets and desserts. Seasonings and condiments All seasonings and condiments. The items listed above may not be a complete list of foods and drinks you can eat. Contact a dietitian for more options. What foods should I avoid? Fruits Canned fruit in heavy syrup. Fruit in cream or butter sauce. Fried fruit. Limit coconut. Vegetables Vegetables cooked in cheese, cream, or butter sauce. Fried vegetables. Grains Breads that are made with saturated or trans fats, oils, or whole milk. Croissants. Sweet rolls. Donuts.  High-fat crackers, such as cheese crackers. Meats and other proteins Fatty meats, such as hot dogs, ribs, sausage, bacon, rib-eye roast or steak. High-fat deli meats, such as salami and bologna. Caviar. Domestic duck and goose. Organ meats, such as liver. Dairy Cream, sour cream, cream cheese, and creamed cottage cheese. Whole-milk cheeses. Whole or 2% milk that is liquid, evaporated, or condensed. Whole buttermilk. Cream sauce or high-fat cheese sauce. Yogurt that is made from whole milk. Fats and oils Meat fat, or shortening. Cocoa butter, hydrogenated oils, palm oil, coconut oil, palm kernel oil. Solid fats and shortenings, including bacon fat, salt pork, lard, and butter. Nondairy cream substitutes. Salad dressings with cheese or sour cream. Beverages Regular sodas and juice drinks with added sugar. Sweets and desserts Frosting. Pudding. Cookies. Cakes. Pies. Milk chocolate or white chocolate. Buttered syrups. Full-fat ice cream or ice cream drinks. The items listed above may not be a complete list of foods and drinks to avoid. Contact a dietitian for more information. Summary  Heart-healthy meal planning includes eating less unhealthy fats, eating more healthy fats, and making other changes in your diet.  Eat a balanced diet. This includes fruits and vegetables, low-fat or nonfat dairy, lean protein, nuts and legumes, whole  grains, and heart-healthy oils and fats. This information is not intended to replace advice given to you by your health care provider. Make sure you discuss any questions you have with your health care provider. Document Revised: 07/24/2017 Document Reviewed: 06/27/2017 Elsevier Patient Education  2021 [employer].

## 2020-10-18 ENCOUNTER — Telehealth: Payer: Self-pay | Admitting: Cardiovascular Disease

## 2020-10-18 NOTE — Telephone Encounter (Signed)
New Message:     Pt would like to switch from your service to Dr Anne Fu. Is this alright with you?

## 2020-10-25 ENCOUNTER — Other Ambulatory Visit: Payer: Self-pay | Admitting: Family Medicine

## 2020-10-25 DIAGNOSIS — Z1231 Encounter for screening mammogram for malignant neoplasm of breast: Secondary | ICD-10-CM

## 2020-10-31 NOTE — Telephone Encounter (Signed)
Dr. Allyson Sabal patient is requesting to switch to you. Would that be fine? Please advise when able.  Thanks

## 2020-12-20 ENCOUNTER — Other Ambulatory Visit: Payer: Self-pay | Admitting: Cardiovascular Disease

## 2020-12-20 DIAGNOSIS — I480 Paroxysmal atrial fibrillation: Secondary | ICD-10-CM

## 2020-12-25 ENCOUNTER — Other Ambulatory Visit: Payer: Self-pay

## 2020-12-25 ENCOUNTER — Ambulatory Visit
Admission: RE | Admit: 2020-12-25 | Discharge: 2020-12-25 | Disposition: A | Discharge status: Home / Self Care | Payer: Medicare Other | Source: Ambulatory Visit | Attending: Family Medicine | Admitting: Family Medicine

## 2020-12-25 DIAGNOSIS — Z1231 Encounter for screening mammogram for malignant neoplasm of breast: Secondary | ICD-10-CM

## 2021-02-10 ENCOUNTER — Other Ambulatory Visit: Payer: Self-pay | Admitting: Cardiovascular Disease

## 2021-02-12 ENCOUNTER — Other Ambulatory Visit: Payer: Self-pay

## 2021-03-12 ENCOUNTER — Ambulatory Visit (INDEPENDENT_AMBULATORY_CARE_PROVIDER_SITE_OTHER): Payer: Medicare Other | Admitting: Cardiology

## 2021-03-12 ENCOUNTER — Other Ambulatory Visit: Payer: Self-pay

## 2021-03-12 ENCOUNTER — Encounter: Payer: Self-pay | Admitting: Cardiology

## 2021-03-12 VITALS — BP 122/60 | HR 56 | Ht 62.0 in | Wt 184.0 lb

## 2021-03-12 DIAGNOSIS — I480 Paroxysmal atrial fibrillation: Secondary | ICD-10-CM | POA: Diagnosis not present

## 2021-03-12 DIAGNOSIS — G4733 Obstructive sleep apnea (adult) (pediatric): Secondary | ICD-10-CM | POA: Insufficient documentation

## 2021-03-12 DIAGNOSIS — Z79899 Other long term (current) drug therapy: Secondary | ICD-10-CM | POA: Insufficient documentation

## 2021-03-12 DIAGNOSIS — I10 Essential (primary) hypertension: Secondary | ICD-10-CM | POA: Diagnosis not present

## 2021-03-12 DIAGNOSIS — R931 Abnormal findings on diagnostic imaging of heart and coronary circulation: Secondary | ICD-10-CM | POA: Diagnosis not present

## 2021-03-12 DIAGNOSIS — Z7901 Long term (current) use of anticoagulants: Secondary | ICD-10-CM

## 2021-03-12 DIAGNOSIS — Z96641 Presence of right artificial hip joint: Secondary | ICD-10-CM

## 2021-03-12 MED ORDER — ROSUVASTATIN CALCIUM 20 MG PO TABS: 20.0000 mg | ORAL_TABLET | Freq: Every day | ORAL | 3 refills | Status: DC

## 2021-03-12 NOTE — Assessment & Plan Note (Signed)
Wears CPAP. Feels better

## 2021-03-12 NOTE — Assessment & Plan Note (Signed)
Episode July 2021 while at horse show in [place].  Taken to the emergency room in the setting of presyncope, shortness of breath.  Auto converted with IV metoprolol.  Currently doing quite well.  Echocardiogram reassuring.  Normal EF.  Normal left atrial size.  No significant valvular abnormalities.

## 2021-03-12 NOTE — Assessment & Plan Note (Addendum)
711 which was 93rd percentile.  She was placed on atorvastatin 20 mg daily.  Prior LDL 60 excellent. Since she has such a score, I will change her over to Crestor 20 mg once a day, high intensity dose for further protection.  We will check lipid panel and ALT in 3 months.  After that, Dr. Tracie Harrier is continuing to monitor closely.  Thankfully, at this time she is not having any symptoms, no shortness of breath no angina.  Since she is on Xarelto, no aspirin to minimize bleeding effects.  Her father had CABG in his 19s.  Denies any anginal symptoms.

## 2021-03-12 NOTE — Assessment & Plan Note (Signed)
Continue with Xarelto 20 mg once a day.  Current medical management.  No excessive bleeding.

## 2021-03-12 NOTE — Assessment & Plan Note (Signed)
08/04/2018 - Dr. Yisroel Ramming. Doing well.

## 2021-03-12 NOTE — Progress Notes (Signed)
Cardiology Office Note:    Date:  03/12/2021   ID:  Adlyn Fife, DOB 1945-08-27, MRN [id number]  PCP:  Aliene Beams, MD   [facility] Providers Cardiologist:  None     Referring MD: Aliene Beams, MD     History of Present Illness:    Colleen Cole is a 75 y.o. female with paroxysmal atrial fibrillation, coronary artery disease/coronary artery calcification with calcium score on 01/11/2020 of 711, hyperlipidemia, mild carotid plaque bilaterally here for follow-up.  Low up.  Previously when she was in [place] on 12/17/2018 when she had an episode of presyncope shortness of breath was taken to the emergency room was found to be in atrial fibrillation with rapid ventricular response.  She was treated at the time with IV metoprolol and auto converted.  Review of labs from that encounter were unremarkable.  She had an echocardiogram done on 01/11/2020 which was normal.  Coronary calcium score 01/11/2020-711 started on atorvastatin.  LDL went from 97 down to 55.  Could consider stress test with calcium score greater than 400.  We will discuss future visit.  Currently she is not having any anginal symptoms.  She is a patient of Dr. Aliene Beams.  Her father had CABG in his 69s.  She is active with horses at [employer], plays golf.  She is retired from [employer].   Overall doing quite well no fevers chills nausea vomiting syncope bleeding.  Occasional nuisance bleeds with nicks in the skin with the Xarelto.  Past Medical History:  Diagnosis Date   Hypertension     Past Surgical History:  Procedure Laterality Date   BREAST EXCISIONAL BIOPSY Right 1995   NEG   FRACTURE SURGERY  2007   fracture in left leg-at patella, tibia and fibula   TOTAL HIP ARTHROPLASTY Right 08/04/2018   Procedure: TOTAL HIP ARTHROPLASTY ANTERIOR APPROACH;  Surgeon: Marcene Corning, MD;  Location: [facility];  Service: Orthopedics;  Laterality: Right;    Current  Medications: Current Meds  Medication Sig   acetaminophen (TYLENOL) 500 MG tablet Take 500 mg by mouth every 6 (six) hours as needed for mild pain or headache.   alendronate (FOSAMAX) 70 MG tablet Take 70 mg by mouth once a week.   amLODipine (NORVASC) 5 MG tablet Take 10 mg by mouth daily.   Calcium 600-200 MG-UNIT tablet Take 1 tablet by mouth 2 (two) times daily.   escitalopram (LEXAPRO) 5 MG tablet Take 5 mg by mouth daily.   fluticasone (FLONASE) 50 MCG/ACT nasal spray Place 1 spray into both nostrils daily as needed for allergies or rhinitis.   hydrALAZINE (APRESOLINE) 25 MG tablet Take 25 mg by mouth 2 (two) times daily.   hydrochlorothiazide (HYDRODIURIL) 25 MG tablet Take 25 mg by mouth daily.   levocetirizine (XYZAL) 5 MG tablet Take 5 mg by mouth as needed.   losartan (COZAAR) 100 MG tablet Take 100 mg by mouth daily.   metoprolol tartrate (LOPRESSOR) 25 MG tablet TAKE 1 TABLET(25 MG) BY MOUTH TWICE DAILY   rosuvastatin (CRESTOR) 20 MG tablet Take 1 tablet (20 mg total) by mouth daily.   SENNA PO Take 1 tablet by mouth daily.   VITAMIN D PO Take 1 capsule by mouth daily.   XARELTO 20 MG TABS tablet Take 1 tablet (20 mg total) by mouth daily.   [DISCONTINUED] atorvastatin (LIPITOR) 20 MG tablet Take 1 tablet (20 mg total) by mouth daily. Pt needs to keep upcoming appt in Oct  for further refills     Allergies:   Patient has no known allergies.   Social History   Socioeconomic History   Marital status: Single    Spouse name: Not on file   Number of children: Not on file   Years of education: Not on file   Highest education level: Not on file  Occupational History   Not on file  Tobacco Use   Smoking status: Former    Types: Cigarettes    Quit date: 07/13/1998    Years since quitting: 22.6   Smokeless tobacco: Never  Vaping Use   Vaping Use: Never used  Substance and Sexual Activity   Alcohol use: Yes    Comment: occassionally   Drug use: Never   Sexual activity:  Not on file  Other Topics Concern   Not on file  Social History Narrative   Not on file   Social Determinants of Health   Financial Resource Strain: Not on file  Food Insecurity: Not on file  Transportation Needs: Not on file  Physical Activity: Not on file  Stress: Not on file  Social Connections: Not on file     Family History: The patient's family history is negative for Breast cancer.  ROS:   Please see the history of present illness.     All other systems reviewed and are negative.  EKGs/Labs/Other Studies Reviewed:    The following studies were reviewed today: ECHO 01/11/20:   1. Left ventricular ejection fraction, by estimation, is 65 to 70%. The  left ventricle has normal function. The left ventricle has no regional  wall motion abnormalities. Left ventricular diastolic parameters were  normal.   2. Right ventricular systolic function is normal. The right ventricular  size is normal. There is normal pulmonary artery systolic pressure. The  estimated right ventricular systolic pressure is 22.4 mmHg.   3. The mitral valve is normal in structure. Trivial mitral valve  regurgitation. No evidence of mitral stenosis.   4. The aortic valve is normal in structure. Aortic valve regurgitation is  not visualized. No aortic stenosis is present.   5. The inferior vena cava is normal in size with greater than 50%  respiratory variability, suggesting right atrial pressure of 3 mmHg.   Coronary calcium score 01/11/2020: Aorta: Normal size. Ascending aorta 3.2 cm. Mild calcification of the aortic root and aortic arch. Moderate calcification in the descending aorta.   Pericardium: Normal   Coronary arteries: Normal anatomy. Right dominant. Calcification noted in all three coronary distributions and the distal left main.   IMPRESSION: Coronary calcium score of 711. This was 93rd percentile for age and sex matched control.   Calcification noted in all three coronary  distributions and the distal left main.   Recommend aggressive risk factor modification including LDL goal <70.  Carotid Doppler 03/24/2020: Right Carotid: Velocities in the right ICA are consistent with a 1-39%  stenosis.   Left Carotid: Velocities in the left ICA are consistent with a 1-39%  stenosis.   Vertebrals:  Bilateral vertebral arteries demonstrate antegrade flow.  Subclavians: Right subclavian artery flow was disturbed. Normal flow               hemodynamics were seen in the left subclavian artery.   Recent Labs: 05/17/2020: ALT 16  Recent Lipid Panel    Component Value Date/Time   CHOL 136 05/17/2020 0905   TRIG 69 05/17/2020 0905   HDL 67 05/17/2020 0905   CHOLHDL 2.0 05/17/2020 0905  LDLCALC 55 05/17/2020 0905     Risk Assessment/Calculations:          Physical Exam:    VS:  BP 122/60 (BP Location: Left Arm, Patient Position: Sitting, Cuff Size: Normal)   Pulse (!) 56   Ht 5\' 2"  (1.575 m)   Wt 184 lb (83.5 kg)   SpO2 96%   BMI 33.65 kg/m     Wt Readings from Last 3 Encounters:  03/12/21 184 lb (83.5 kg)  06/13/20 183 lb (83 kg)  03/21/20 180 lb 6.4 oz (81.8 kg)     GEN:  Well nourished, well developed in no acute distress HEENT: Normal NECK: No JVD; No carotid bruits LYMPHATICS: No lymphadenopathy CARDIAC: RRR, no murmurs, rubs, gallops RESPIRATORY:  Clear to auscultation without rales, wheezing or rhonchi  ABDOMEN: Soft, non-tender, non-distended MUSCULOSKELETAL:  No edema; No deformity  SKIN: Warm and dry NEUROLOGIC:  Alert and oriented x 3 PSYCHIATRIC:  Normal affect   ASSESSMENT:    1. Medication management   2. Paroxysmal atrial fibrillation (HCC)   3. Elevated coronary artery calcium score   4. Essential hypertension   5. History of right hip replacement   6. Chronic anticoagulation   7. OSA (obstructive sleep apnea)    PLAN:    In order of problems listed above: Paroxysmal atrial fibrillation [facility]) Episode July 2021  while at horse show in August 2021.  Taken to the emergency room in the setting of presyncope, shortness of breath.  Auto converted with IV metoprolol.  Currently doing quite well.  Echocardiogram reassuring.  Normal EF.  Normal left atrial size.  No significant valvular abnormalities.  Elevated coronary artery calcium score 711 which was 93rd percentile.  She was placed on atorvastatin 20 mg daily.  Prior LDL 60 excellent. Since she has such a score, I will change her over to Crestor 20 mg once a day, high intensity dose for further protection.  We will check lipid panel and ALT in 3 months.  After that, Dr. [place] is continuing to monitor closely.  Thankfully, at this time she is not having any symptoms, no shortness of breath no angina.  Since she is on Xarelto, no aspirin to minimize bleeding effects.  Her father had CABG in his 25s.  Denies any anginal symptoms.  Essential hypertension Currently well controlled with losartan 100, hydrochlorothiazide 25, hydralazine 25 twice a day, amlodipine 10 mg daily.  Labs followed closely by Dr. 73s.  History of right hip replacement 08/04/2018 - Dr. 10/04/2018. Doing well.   Chronic anticoagulation Continue with Xarelto 20 mg once a day.  Current medical management.  No excessive bleeding.  OSA (obstructive sleep apnea) Wears CPAP. Feels better   You all    Medication Adjustments/Labs and Tests Ordered: Current medicines are reviewed at length with the patient today.  Concerns regarding medicines are outlined above.  Orders Placed This Encounter  Procedures   Lipid Profile   ALT    Meds ordered this encounter  Medications   rosuvastatin (CRESTOR) 20 MG tablet    Sig: Take 1 tablet (20 mg total) by mouth daily.    Dispense:  90 tablet    Refill:  3     Patient Instructions  Medication Instructions:  Stop atorvastatin (Lipitor) Start rosuvastatin (Crestor) 20 mg, take one tablet by mouth daily *If you need a refill on your cardiac  medications before your next appointment, please call your pharmacy*   Lab Work: Fasting Lipids and ALT in 3 months If  you have labs (blood work) drawn today and your tests are completely normal, you will receive your results only by: MyChart Message (if you have MyChart) OR A paper copy in the mail If you have any lab test that is abnormal or we need to change your treatment, we will call you to review the results.   Follow-Up: At [facility], you and your health needs are our priority.  As part of our continuing mission to provide you with exceptional heart care, we have created designated Provider Care Teams.  These Care Teams include your primary Cardiologist (physician) and Advanced Practice Providers (APPs -  Physician Assistants and Nurse Practitioners) who all work together to provide you with the care you need, when you need it.  We recommend signing up for the patient portal called "MyChart".  Sign up information is provided on this After Visit Summary.  MyChart is used to connect with patients for Virtual Visits (Telemedicine).  Patients are able to view lab/test results, encounter notes, upcoming appointments, etc.  Non-urgent messages can be sent to your provider as well.   To learn more about what you can do with MyChart, go to [email or web address].    Your next appointment:   6 month(s)  The format for your next appointment:   In Person  Provider:   Donato Schultz, MD      Signed, Donato Schultz, MD  03/12/2021 10:20 AM    [facility]

## 2021-03-12 NOTE — Patient Instructions (Signed)
Medication Instructions:  Stop atorvastatin (Lipitor) Start rosuvastatin (Crestor) 20 mg, take one tablet by mouth daily *If you need a refill on your cardiac medications before your next appointment, please call your pharmacy*   Lab Work: Fasting Lipids and ALT in 3 months If you have labs (blood work) drawn today and your tests are completely normal, you will receive your results only by: MyChart Message (if you have MyChart) OR A paper copy in the mail If you have any lab test that is abnormal or we need to change your treatment, we will call you to review the results.   Follow-Up: At [facility], you and your health needs are our priority.  As part of our continuing mission to provide you with exceptional heart care, we have created designated Provider Care Teams.  These Care Teams include your primary Cardiologist (physician) and Advanced Practice Providers (APPs -  Physician Assistants and Nurse Practitioners) who all work together to provide you with the care you need, when you need it.  We recommend signing up for the patient portal called "MyChart".  Sign up information is provided on this After Visit Summary.  MyChart is used to connect with patients for Virtual Visits (Telemedicine).  Patients are able to view lab/test results, encounter notes, upcoming appointments, etc.  Non-urgent messages can be sent to your provider as well.   To learn more about what you can do with MyChart, go to [email or web address].    Your next appointment:   6 month(s)  The format for your next appointment:   In Person  Provider:   Donato Schultz, MD

## 2021-03-12 NOTE — Assessment & Plan Note (Signed)
Currently well controlled with losartan 100, hydrochlorothiazide 25, hydralazine 25 twice a day, amlodipine 10 mg daily.  Labs followed closely by Dr. Tracie Harrier.

## 2021-05-21 ENCOUNTER — Other Ambulatory Visit: Payer: Self-pay | Admitting: Cardiology

## 2021-06-12 ENCOUNTER — Other Ambulatory Visit: Payer: Self-pay

## 2021-06-12 ENCOUNTER — Other Ambulatory Visit: Payer: Medicare Other | Admitting: *Deleted

## 2021-06-12 DIAGNOSIS — Z7901 Long term (current) use of anticoagulants: Secondary | ICD-10-CM

## 2021-06-12 DIAGNOSIS — I480 Paroxysmal atrial fibrillation: Secondary | ICD-10-CM

## 2021-06-12 DIAGNOSIS — Z79899 Other long term (current) drug therapy: Secondary | ICD-10-CM

## 2021-06-12 DIAGNOSIS — R931 Abnormal findings on diagnostic imaging of heart and coronary circulation: Secondary | ICD-10-CM

## 2021-06-12 DIAGNOSIS — I10 Essential (primary) hypertension: Secondary | ICD-10-CM

## 2021-06-12 LAB — LIPID PANEL
Chol/HDL Ratio: 1.9 ratio (ref 0.0–4.4)
Cholesterol, Total: 151 mg/dL (ref 100–199)
HDL: 81 mg/dL (ref >39)
LDL Chol Calc (NIH): 57 mg/dL (ref 0–99)
Triglycerides: 63 mg/dL (ref 0–149)
VLDL Cholesterol Cal: 13 mg/dL (ref 5–40)

## 2021-06-12 LAB — ALT: ALT: 14 IU/L (ref 0–32)

## 2021-08-13 ENCOUNTER — Telehealth: Payer: Self-pay | Admitting: *Deleted

## 2021-08-13 NOTE — Telephone Encounter (Signed)
Pt was added on for eval of shortness of breath 3/14 with Dr Anne Fu. ?

## 2021-08-13 NOTE — Telephone Encounter (Signed)
Shortness of breath ?Received: Today ?Jake Bathe, MD  Sharin Grave, RN ?Let's see if she can come in at 10 today  ?-Loraine Leriche  ? ?Attempted to contact pt regarding adding her on to see Dr Anne Fu for SOB per his instruction.  NA - left message to call back to schedule.   ?

## 2021-08-14 ENCOUNTER — Ambulatory Visit: Payer: Medicare Other

## 2021-08-14 ENCOUNTER — Other Ambulatory Visit: Payer: Self-pay

## 2021-08-14 ENCOUNTER — Ambulatory Visit (INDEPENDENT_AMBULATORY_CARE_PROVIDER_SITE_OTHER): Payer: Medicare Other | Admitting: Cardiology

## 2021-08-14 ENCOUNTER — Encounter: Payer: Self-pay | Admitting: Cardiology

## 2021-08-14 VITALS — BP 124/64 | HR 62 | Ht 63.0 in | Wt 186.0 lb

## 2021-08-14 DIAGNOSIS — I480 Paroxysmal atrial fibrillation: Secondary | ICD-10-CM

## 2021-08-14 DIAGNOSIS — I25119 Atherosclerotic heart disease of native coronary artery with unspecified angina pectoris: Secondary | ICD-10-CM | POA: Diagnosis not present

## 2021-08-14 DIAGNOSIS — I209 Angina pectoris, unspecified: Secondary | ICD-10-CM | POA: Diagnosis not present

## 2021-08-14 DIAGNOSIS — R0609 Other forms of dyspnea: Secondary | ICD-10-CM

## 2021-08-14 LAB — BASIC METABOLIC PANEL
BUN/Creatinine Ratio: 18 (ref 12–28)
BUN: 12 mg/dL (ref 8–27)
CO2: 25 mmol/L (ref 20–29)
Calcium: 10.2 mg/dL (ref 8.7–10.3)
Chloride: 98 mmol/L (ref 96–106)
Creatinine, Ser: 0.66 mg/dL (ref 0.57–1.00)
Glucose: 95 mg/dL (ref 70–99)
Potassium: 4.2 mmol/L (ref 3.5–5.2)
Sodium: 136 mmol/L (ref 134–144)
eGFR: 93 mL/min/{1.73_m2} (ref >59)

## 2021-08-14 MED ORDER — METOPROLOL TARTRATE 50 MG PO TABS: 50.0000 mg | ORAL_TABLET | Freq: Once | ORAL | 0 refills | Status: DC

## 2021-08-14 NOTE — H&P (View-Only) (Signed)
?Cardiology Office Note:   ? ?Date:  08/14/2021  ? ?ID:  Colleen Cole, DOB 07/13/1945, MRN [id number] ? ?PCP:  Hagler, Rachel, MD ?  ?[facility] Providers ?Cardiologist:  None    ? ?Referring MD: Hagler, Rachel, MD  ? ? ?History of Present Illness:   ? ?Colleen Cole is a 75 y.o. female here for the evaluation of worsening shortness of breath.  She has paroxysmal atrial fibrillation on Xarelto, coronary artery calcification/atherosclerosis with calcium score on 01/11/2020 of 711, hyperlipidemia, mild carotid plaque bilaterally. ? ?Over the last few weeks with increasing frequency, she has been noticing more shortness of breath with more minimal physical activity.  For instance, while at a riding lesson, it does not take much at all for her to have to stop and catch her breath.  This is not usual for her. Had COVID 02/2021 - low energy for weeks. Was not active at the time. Walking more. Not going to the gym. Some weights and streching.  3 times a week ride.  ? ?Very brief epidoses if any very rare. Apple watch no AFIB.  ? ?Back in July 2020 while at a horse show ([name]in [place] she had an episode of shortness of breath, presyncope and was taken to the emergency room was found to be in atrial fibrillation with rapid ventricular response.  At the time she was treated with IV metoprolol and auto converted.  Lab work was unremarkable at that time. ? ?In 01/11/2020 she had an echocardiogram which showed normal pump function. ? ?A coronary calcium score was performed on 01/11/2020 with a score of 711 and therefore she was started on atorvastatin.  Her LDL was reduced from 97 down to 55.  She was not having any anginal symptoms at the time of her calcium score. ? ?Her father had CABG in his 70s.  She enjoys golf, very active.  She is retired from the grocery business. ? ?Occasional she will have nuisance bleeding from Xarelto. ? ? ? ?Past Medical History:  ?Diagnosis Date  ? Hypertension   ? ? ?Past  Surgical History:  ?Procedure Laterality Date  ? BREAST EXCISIONAL BIOPSY Right 1995  ? NEG  ? FRACTURE SURGERY  2007  ? fracture in left leg-at patella, tibia and fibula  ? TOTAL HIP ARTHROPLASTY Right 08/04/2018  ? Procedure: TOTAL HIP ARTHROPLASTY ANTERIOR APPROACH;  Surgeon: Dalldorf, Peter, MD;  Location: [facility];  Service: Orthopedics;  Laterality: Right;  ? ? ?Current Medications: ?Current Meds  ?Medication Sig  ? acetaminophen (TYLENOL) 500 MG tablet Take 500 mg by mouth every 6 (six) hours as needed for mild pain or headache.  ? alendronate (FOSAMAX) 70 MG tablet Take 70 mg by mouth once a week.  ? amLODipine (NORVASC) 5 MG tablet Take 10 mg by mouth daily.  ? Calcium 600-200 MG-UNIT tablet Take 1 tablet by mouth 2 (two) times daily.  ? escitalopram (LEXAPRO) 5 MG tablet Take 5 mg by mouth daily.  ? fluticasone (FLONASE) 50 MCG/ACT nasal spray Place 1 spray into both nostrils daily as needed for allergies or rhinitis.  ? hydrALAZINE (APRESOLINE) 25 MG tablet Take 1 tablet (25 mg total) by mouth 2 (two) times daily.  ? hydrochlorothiazide (HYDRODIURIL) 25 MG tablet Take 25 mg by mouth daily.  ? levocetirizine (XYZAL) 5 MG tablet Take 5 mg by mouth daily.  ? losartan (COZAAR) 100 MG tablet Take 100 mg by mouth daily.  ? metoprolol tartrate (LOPRESSOR) 25 MG tablet TAKE 1   TABLET(25 MG) BY MOUTH TWICE DAILY  ? metoprolol tartrate (LOPRESSOR) 50 MG tablet Take 1 tablet (50 mg total) by mouth once for 1 dose. Take 90-120 minutes prior to scan.  ? OZEMPIC, 1 MG/DOSE, 4 MG/3ML SOPN Inject 1 mg into the skin once a week.  ? rosuvastatin (CRESTOR) 20 MG tablet Take 1 tablet (20 mg total) by mouth daily.  ? SENNA PO Take 1 tablet by mouth daily.  ? VITAMIN D PO Take 1 capsule by mouth daily.  ? XARELTO 20 MG TABS tablet Take 1 tablet (20 mg total) by mouth daily.  ?  ? ?Allergies:   Patient has no known allergies.  ? ?Social History  ? ?Socioeconomic History  ? Marital status: Single  ?  Spouse name: Not on file  ?  Number of children: Not on file  ? Years of education: Not on file  ? Highest education level: Not on file  ?Occupational History  ? Not on file  ?Tobacco Use  ? Smoking status: Former  ?  Types: Cigarettes  ?  Quit date: 07/13/1998  ?  Years since quitting: 23.1  ? Smokeless tobacco: Never  ?Vaping Use  ? Vaping Use: Never used  ?Substance and Sexual Activity  ? Alcohol use: Yes  ?  Comment: occassionally  ? Drug use: Never  ? Sexual activity: Not on file  ?Other Topics Concern  ? Not on file  ?Social History Narrative  ? Not on file  ? ?Social Determinants of Health  ? ?Financial Resource Strain: Not on file  ?Food Insecurity: Not on file  ?Transportation Needs: Not on file  ?Physical Activity: Not on file  ?Stress: Not on file  ?Social Connections: Not on file  ?  ? ?Family History: ?The patient's family history is negative for Breast cancer. ? ?ROS:   ?Please see the history of present illness.    ? All other systems reviewed and are negative. ? ?EKGs/Labs/Other Studies Reviewed:   ? ?The following studies were reviewed today: ?Coronary calcium score 2021 as above personally reviewed and interpreted ? ? ?Echocardiogram 01/11/2020: ? ? ? 1. Left ventricular ejection fraction, by estimation, is 65 to 70%. The  ?left ventricle has normal function. The left ventricle has no regional  ?wall motion abnormalities. Left ventricular diastolic parameters were  ?normal.  ? 2. Right ventricular systolic function is normal. The right ventricular  ?size is normal. There is normal pulmonary artery systolic pressure. The  ?estimated right ventricular systolic pressure is [id number] mmHg.  ? 3. The mitral valve is normal in structure. Trivial mitral valve  ?regurgitation. No evidence of mitral stenosis.  ? 4. The aortic valve is normal in structure. Aortic valve regurgitation is  ?not visualized. No aortic stenosis is present.  ? 5. The inferior vena cava is normal in size with greater than 50%  ?respiratory variability, suggesting  right atrial pressure of 3 mmHg.  ? ?EKG:  EKG is  ordered today.  The ekg ordered today demonstrates sinus rhythm 62 no other abnormalities QTc 401 ? ?Recent Labs: ?06/12/2021: ALT 14  ?Recent Lipid Panel ?   ?Component Value Date/Time  ? CHOL 151 06/12/2021 0814  ? TRIG 63 06/12/2021 0814  ? HDL 81 06/12/2021 0814  ? CHOLHDL 1.9 06/12/2021 0814  ? [place] 57 06/12/2021 0814  ? ?    ? ?   ? ?Physical Exam:   ? ?VS:  BP 124/64 (BP Location: Left Arm)   Pulse 62   Ht 5'  3" (1.6 m)   Wt 186 lb (84.4 kg)   SpO2 96%   BMI 32.95 kg/m?    ? ?Wt Readings from Last 3 Encounters:  ?08/14/21 186 lb (84.4 kg)  ?03/12/21 184 lb (83.5 kg)  ?06/13/20 183 lb (83 kg)  ?  ? ?GEN:  Well nourished, well developed in no acute distress ?HEENT: Normal ?NECK: No JVD; No carotid bruits ?LYMPHATICS: No lymphadenopathy ?CARDIAC: RRR, no murmurs, no rubs, gallops ?RESPIRATORY:  Clear to auscultation without rales, wheezing or rhonchi  ?ABDOMEN: Soft, non-tender, non-distended ?MUSCULOSKELETAL:  No edema; No deformity  ?SKIN: Warm and dry ?NEUROLOGIC:  Alert and oriented x 3 ?PSYCHIATRIC:  Normal affect  ? ?ASSESSMENT:   ? ?1. Coronary artery disease involving native coronary artery of native heart with angina pectoris ([place])   ?2. Angina pectoris ([place])   ?3. Dyspnea on exertion   ?4. PAF (paroxysmal atrial fibrillation) ([place])   ? ?PLAN:   ? ?In order of problems listed above: ? ?Angina pectoris ([place]) ?She has been experiencing increasing shortness of breath with minimal exertion.  She has noted this over the last few months.  She has known coronary artery disease/calcification in proximal LAD/mid LAD as well as RCA and focal circumflex distribution.  Calcium score was 711.  Her heart rate on ECG was 62 bpm.  She takes metoprolol 25 mg twice a day.  I would like to go ahead and start with a coronary CT scan with possible FFR analysis.  Hopefully we will be able to obtain the information necessary to exclude flow-limiting coronary artery  disease even with her coronary calcification present.  If CT scan is significant for luminal stenosis, we will proceed with diagnostic heart catheterization.  Risk and benefits of procedure have been discussed.  We will a

## 2021-08-14 NOTE — Assessment & Plan Note (Signed)
Repeating echocardiogram.  After COVID last fall, she noticed a decrease in energy originally.  I want to make sure that EF is normal postviral. ?

## 2021-08-14 NOTE — Assessment & Plan Note (Addendum)
Checking coronary CT scan with possible FFR analysis.  Prior coronary calcium score reviewed.  Elevated.  LAD RCA as well as circumflex calcification.  Continue with high intensity statin.  On Crestor 20 mg.  Last LDL 57 in January 2023.  Perfect.  Hemoglobin A1c 5.7. ?

## 2021-08-14 NOTE — Assessment & Plan Note (Signed)
She has been experiencing increasing shortness of breath with minimal exertion.  She has noted this over the last few months.  She has known coronary artery disease/calcification in proximal LAD/mid LAD as well as RCA and focal circumflex distribution.  Calcium score was 711.  Her heart rate on ECG was 62 bpm.  She takes metoprolol 25 mg twice a day.  I would like to go ahead and start with a coronary CT scan with possible FFR analysis.  Hopefully we will be able to obtain the information necessary to exclude flow-limiting coronary artery disease even with her coronary calcification present.  If CT scan is significant for luminal stenosis, we will proceed with diagnostic heart catheterization.  Risk and benefits of procedure have been discussed.  We will also check an echocardiogram given her dyspnea on exertion.  Previously her EF was normal.  In addition, I will check a Zio patch monitor after her CT scan to ensure that there is no evidence of atrial fibrillation that her is causing the shortness of breath.  I think this is unlikely.  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  We will follow-up with results.  For now, continue with Xarelto, rosuvastatin 20 mg high intensity, amlodipine 10 mg a day.  We will give her an additional 25 mg of metoprolol tartrate to take prior to her coronary CT scan. ?

## 2021-08-14 NOTE — Patient Instructions (Addendum)
Medication Instructions:  ? ?Your physician recommends that you continue on your current medications as directed. Please refer to the Current Medication list given to you today. ? ?*If you need a refill on your cardiac medications before your next appointment, please call your pharmacy* ? ? ?Lab:  BMET TODAY ? ? ? ?Testing/Procedures: ? ?Your physician has requested that you have an echocardiogram. Echocardiography is a painless test that uses sound waves to create images of your heart. It provides your doctor with information about the size and shape of your heart and how well your heart?s chambers and valves are working. This procedure takes approximately one hour. There are no restrictions for this procedure. ? ? ? ? ?Your cardiac CT will be scheduled at one of the below locations:  ? ?[facility] ?[address] ?[place], [place] [address] ?[phone] ? ?If scheduled at [facility], please arrive at the [facility] and Children[facility] Entrance (Entrance C2) of [facility] 30 minutes prior to test start time. ?You can use the FREE valet parking offered at entrance C (encouraged to control the heart rate for the test)  ?Proceed to the [facility] Radiology Department (first floor) to check-in and test prep. ? ?All radiology patients and guests should use entrance C2 at [facility], accessed from [facility], even though the hospital's physical address listed is [address] ? ? ? ?Please follow these instructions carefully (unless otherwise directed): ? ? ?On the Night Before the Test: ?Be sure to Drink plenty of water. ?Do not consume any caffeinated/decaffeinated beverages or chocolate 12 hours prior to your test. ?Do not take any antihistamines 12 hours prior to your test. ? ?On the Day of the Test: ?Drink plenty of water until 1 hour prior to the test. ?Do not eat any food 4 hours prior to the test. ?You may take your regular medications prior to the test.   ?Take metoprolol 50 mg by mouth (Lopressor) two hours prior to test. ?HOLD Hydrochlorothiazide morning of the test. ?FEMALES- please wear underwire-free bra if available, avoid dresses & tight clothing ?     ?After the Test: ?Drink plenty of water. ?After receiving IV contrast, you may experience a mild flushed feeling. This is normal. ?On occasion, you may experience a mild rash up to 24 hours after the test. This is not dangerous. If this occurs, you can take Benadryl 25 mg and increase your fluid intake. ?If you experience trouble breathing, this can be serious. If it is severe call 911 IMMEDIATELY. If it is mild, please call our office. ? ?We will call to schedule your test 2-4 weeks out understanding that some insurance companies will need an authorization prior to the service being performed.  ? ?For non-scheduling related questions, please contact the cardiac imaging nurse navigator should you have any questions/concerns: ?Marchia Bond, Cardiac Imaging Nurse Navigator ?Gordy Clement, Cardiac Imaging Nurse Navigator ?[facility] ?Direct Office Dial: [phone]  ? ?For scheduling needs, including cancellations and rescheduling, please call [place], [phone]. ? ? ?ZIO XT- Long Term Monitor Instructions--zio will be placed after cardiac ct complete ? ?Your physician has requested you wear a ZIO patch monitor for 14 days.  ?This is a single patch monitor. [employer] supplies one patch monitor per enrollment. Additional ?stickers are not available. Please do not apply patch if you will be having a Nuclear Stress Test,  ?Echocardiogram, Cardiac CT, MRI, or Chest Xray during the period you would  be wearing the  ?monitor. The patch cannot be worn during these tests. You cannot remove and re-apply the  ?ZIO XT patch monitor.  ?Your ZIO patch monitor will be mailed 3 day USPS to your address on file. It may take 3-5 days  ?to receive your monitor after you have been enrolled.  ?Once you  have received your monitor, please review the enclosed instructions. Your monitor  ?has already been registered assigning a specific monitor serial # to you. ? ?Billing and Patient Assistance Program Information ? ?We have supplied [employer] with any of your insurance information on file for billing purposes. ?[employer] offers a sliding scale Patient Assistance Program for patients that do not have  ?insurance, or whose insurance does not completely cover the cost of the ZIO monitor.  ?You must apply for the Patient Assistance Program to qualify for this discounted rate.  ?To apply, please call [employer] at [phone], select option 4, select option 2, ask to apply for  ?Patient Assistance Program. Theodore Demark will ask your household income, and how many people  ?are in your household. They will quote your out-of-pocket cost based on that information.  ?[employer] will also be able to set up a 14-month, interest-free payment plan if needed. ? ?Applying the monitor ?  ?Shave hair from upper left chest.  ?Hold abrader disc by orange tab. Rub abrader in 40 strokes over the upper left chest as  ?indicated in your monitor instructions.  ?Clean area with 4 enclosed alcohol pads. Let dry.  ?Apply patch as indicated in monitor instructions. Patch will be placed under collarbone on left  ?side of chest with arrow pointing upward.  ?Rub patch adhesive wings for 2 minutes. Remove white label marked "1". Remove the white  ?label marked "2". Rub patch adhesive wings for 2 additional minutes.  ?While looking in a mirror, press and release button in center of patch. A small green light will  ?flash 3-4 times. This will be your only indicator that the monitor has been turned on.  ?Do not shower for the first 24 hours. You may shower after the first 24 hours.  ?Press the button if you feel a symptom. You will hear a small click. Record Date, Time and  ?Symptom in the Patient Logbook.  ?When you are ready to remove the patch, follow  instructions on the last 2 pages of Patient  ?Logbook. Stick patch monitor onto the last page of Patient Logbook.  ?Place Patient Logbook in the blue and white box. Use locking tab on box and tape box closed  ?securely. The blue and white box has prepaid postage on it. Please place it in the mailbox as  ?soon as possible. Your physician should have your test results approximately 7 days after the  ?monitor has been mailed back to [facility].  ?Call [facility] at [phone] if you have questions regarding  ?your ZIO XT patch monitor. Call them immediately if you see an orange light blinking on your  ?monitor.  ?If your monitor falls off in less than 4 days, contact our Monitor department at [phone].  ?If your monitor becomes loose or falls off after 4 days call [employer] at [phone] for  ?suggestions on securing your monitor ? ? ? ? ?Follow-Up: ? ?2 MONTHS IN THE OFFICE WITH DR. Marlou Porch ? ? ? ?

## 2021-08-14 NOTE — Progress Notes (Unsigned)
Enrolled for [employer] to mail a ZIO XT long term holter monitor to the patients address on file.  ? ?Patient to apply after CT. ?

## 2021-08-14 NOTE — Assessment & Plan Note (Signed)
I will check a Zio patch monitor to ensure that she does not have any paroxysmal atrial episodes that correlate with dyspnea. ?

## 2021-08-14 NOTE — Progress Notes (Signed)
?Cardiology Office Note:   ? ?Date:  08/14/2021  ? ?ID:  Colleen Cole, DOB [birth year], MRN [id number] ? ?PCP:  Aliene Beams, MD ?  ?[facility] Providers ?Cardiologist:  None    ? ?Referring MD: Aliene Beams, MD  ? ? ?History of Present Illness:   ? ?Colleen Cole is a 76 y.o. female here for the evaluation of worsening shortness of breath.  She has paroxysmal atrial fibrillation on Xarelto, coronary artery calcification/atherosclerosis with calcium score on 01/11/2020 of 711, hyperlipidemia, mild carotid plaque bilaterally. ? ?Over the last few weeks with increasing frequency, she has been noticing more shortness of breath with more minimal physical activity.  For instance, while at a riding lesson, it does not take much at all for her to have to stop and catch her breath.  This is not usual for her. Had COVID 02/2021 - low energy for weeks. Was not active at the time. Walking more. Not going to the gym. Some weights and streching.  3 times a week ride.  ? ?Very brief epidoses if any very rare. Apple watch no AFIB.  ? ?Back in July 2020 while at a horse show ([name]in [place] she had an episode of shortness of breath, presyncope and was taken to the emergency room was found to be in atrial fibrillation with rapid ventricular response.  At the time she was treated with IV metoprolol and auto converted.  Lab work was unremarkable at that time. ? ?In 01/11/2020 she had an echocardiogram which showed normal pump function. ? ?A coronary calcium score was performed on 01/11/2020 with a score of 711 and therefore she was started on atorvastatin.  Her LDL was reduced from 97 down to 55.  She was not having any anginal symptoms at the time of her calcium score. ? ?Her father had CABG in his 23s.  She enjoys golf, very active.  She is retired from [employer]. ? ?Occasional she will have nuisance bleeding from Xarelto. ? ? ? ?Past Medical History:  ?Diagnosis Date  ? Hypertension   ? ? ?Past  Surgical History:  ?Procedure Laterality Date  ? BREAST EXCISIONAL BIOPSY Right 1995  ? NEG  ? FRACTURE SURGERY  2007  ? fracture in left leg-at patella, tibia and fibula  ? TOTAL HIP ARTHROPLASTY Right 08/04/2018  ? Procedure: TOTAL HIP ARTHROPLASTY ANTERIOR APPROACH;  Surgeon: Marcene Corning, MD;  Location: [facility];  Service: Orthopedics;  Laterality: Right;  ? ? ?Current Medications: ?Current Meds  ?Medication Sig  ? acetaminophen (TYLENOL) 500 MG tablet Take 500 mg by mouth every 6 (six) hours as needed for mild pain or headache.  ? alendronate (FOSAMAX) 70 MG tablet Take 70 mg by mouth once a week.  ? amLODipine (NORVASC) 5 MG tablet Take 10 mg by mouth daily.  ? Calcium 600-200 MG-UNIT tablet Take 1 tablet by mouth 2 (two) times daily.  ? escitalopram (LEXAPRO) 5 MG tablet Take 5 mg by mouth daily.  ? fluticasone (FLONASE) 50 MCG/ACT nasal spray Place 1 spray into both nostrils daily as needed for allergies or rhinitis.  ? hydrALAZINE (APRESOLINE) 25 MG tablet Take 1 tablet (25 mg total) by mouth 2 (two) times daily.  ? hydrochlorothiazide (HYDRODIURIL) 25 MG tablet Take 25 mg by mouth daily.  ? levocetirizine (XYZAL) 5 MG tablet Take 5 mg by mouth daily.  ? losartan (COZAAR) 100 MG tablet Take 100 mg by mouth daily.  ? metoprolol tartrate (LOPRESSOR) 25 MG tablet TAKE 1  TABLET(25 MG) BY MOUTH TWICE DAILY  ? metoprolol tartrate (LOPRESSOR) 50 MG tablet Take 1 tablet (50 mg total) by mouth once for 1 dose. Take 90-120 minutes prior to scan.  ? OZEMPIC, 1 MG/DOSE, 4 MG/3ML SOPN Inject 1 mg into the skin once a week.  ? rosuvastatin (CRESTOR) 20 MG tablet Take 1 tablet (20 mg total) by mouth daily.  ? SENNA PO Take 1 tablet by mouth daily.  ? VITAMIN D PO Take 1 capsule by mouth daily.  ? XARELTO 20 MG TABS tablet Take 1 tablet (20 mg total) by mouth daily.  ?  ? ?Allergies:   Patient has no known allergies.  ? ?Social History  ? ?Socioeconomic History  ? Marital status: Single  ?  Spouse name: Not on file  ?  Number of children: Not on file  ? Years of education: Not on file  ? Highest education level: Not on file  ?Occupational History  ? Not on file  ?Tobacco Use  ? Smoking status: Former  ?  Types: Cigarettes  ?  Quit date: 07/13/1998  ?  Years since quitting: 23.1  ? Smokeless tobacco: Never  ?Vaping Use  ? Vaping Use: Never used  ?Substance and Sexual Activity  ? Alcohol use: Yes  ?  Comment: occassionally  ? Drug use: Never  ? Sexual activity: Not on file  ?Other Topics Concern  ? Not on file  ?Social History Narrative  ? Not on file  ? ?Social Determinants of Health  ? ?Financial Resource Strain: Not on file  ?Food Insecurity: Not on file  ?Transportation Needs: Not on file  ?Physical Activity: Not on file  ?Stress: Not on file  ?Social Connections: Not on file  ?  ? ?Family History: ?The patient's family history is negative for Breast cancer. ? ?ROS:   ?Please see the history of present illness.    ? All other systems reviewed and are negative. ? ?EKGs/Labs/Other Studies Reviewed:   ? ?The following studies were reviewed today: ?Coronary calcium score 2021 as above personally reviewed and interpreted ? ? ?Echocardiogram 01/11/2020: ? ? ? 1. Left ventricular ejection fraction, by estimation, is 65 to 70%. The  ?left ventricle has normal function. The left ventricle has no regional  ?wall motion abnormalities. Left ventricular diastolic parameters were  ?normal.  ? 2. Right ventricular systolic function is normal. The right ventricular  ?size is normal. There is normal pulmonary artery systolic pressure. The  ?estimated right ventricular systolic pressure is [id number] mmHg.  ? 3. The mitral valve is normal in structure. Trivial mitral valve  ?regurgitation. No evidence of mitral stenosis.  ? 4. The aortic valve is normal in structure. Aortic valve regurgitation is  ?not visualized. No aortic stenosis is present.  ? 5. The inferior vena cava is normal in size with greater than 50%  ?respiratory variability, suggesting  right atrial pressure of 3 mmHg.  ? ?EKG:  EKG is  ordered today.  The ekg ordered today demonstrates sinus rhythm 62 no other abnormalities QTc 401 ? ?Recent Labs: ?06/12/2021: ALT 14  ?Recent Lipid Panel ?   ?Component Value Date/Time  ? CHOL 151 06/12/2021 0814  ? TRIG 63 06/12/2021 0814  ? HDL 81 06/12/2021 0814  ? CHOLHDL 1.9 06/12/2021 0814  ? [place] 57 06/12/2021 0814  ? ?    ? ?   ? ?Physical Exam:   ? ?VS:  BP 124/64 (BP Location: Left Arm)   Pulse 62   Ht 5'  3" (1.6 m)   Wt 186 lb (84.4 kg)   SpO2 96%   BMI 32.95 kg/m?    ? ?Wt Readings from Last 3 Encounters:  ?08/14/21 186 lb (84.4 kg)  ?03/12/21 184 lb (83.5 kg)  ?06/13/20 183 lb (83 kg)  ?  ? ?GEN:  Well nourished, well developed in no acute distress ?HEENT: Normal ?NECK: No JVD; No carotid bruits ?LYMPHATICS: No lymphadenopathy ?CARDIAC: RRR, no murmurs, no rubs, gallops ?RESPIRATORY:  Clear to auscultation without rales, wheezing or rhonchi  ?ABDOMEN: Soft, non-tender, non-distended ?MUSCULOSKELETAL:  No edema; No deformity  ?SKIN: Warm and dry ?NEUROLOGIC:  Alert and oriented x 3 ?PSYCHIATRIC:  Normal affect  ? ?ASSESSMENT:   ? ?1. Coronary artery disease involving native coronary artery of native heart with angina pectoris ([place])   ?2. Angina pectoris ([place])   ?3. Dyspnea on exertion   ?4. PAF (paroxysmal atrial fibrillation) ([place])   ? ?PLAN:   ? ?In order of problems listed above: ? ?Angina pectoris ([place]) ?She has been experiencing increasing shortness of breath with minimal exertion.  She has noted this over the last few months.  She has known coronary artery disease/calcification in proximal LAD/mid LAD as well as RCA and focal circumflex distribution.  Calcium score was 711.  Her heart rate on ECG was 62 bpm.  She takes metoprolol 25 mg twice a day.  I would like to go ahead and start with a coronary CT scan with possible FFR analysis.  Hopefully we will be able to obtain the information necessary to exclude flow-limiting coronary artery  disease even with her coronary calcification present.  If CT scan is significant for luminal stenosis, we will proceed with diagnostic heart catheterization.  Risk and benefits of procedure have been discussed.  We will a

## 2021-08-15 ENCOUNTER — Other Ambulatory Visit: Payer: Self-pay | Admitting: Cardiology

## 2021-08-15 DIAGNOSIS — I209 Angina pectoris, unspecified: Secondary | ICD-10-CM

## 2021-08-15 DIAGNOSIS — R0609 Other forms of dyspnea: Secondary | ICD-10-CM

## 2021-08-15 IMAGING — MG MM DIGITAL SCREENING BILAT W/ TOMO AND CAD
8 series · 8 of 24 positions shown · non-contrast
Comparison: Previous exam(s).

CLINICAL DATA: Screening.

EXAM:
DIGITAL SCREENING BILATERAL MAMMOGRAM WITH TOMOSYNTHESIS AND CAD
TECHNIQUE: Bilateral screening digital craniocaudal and mediolateral oblique
mammograms were obtained. Bilateral screening digital breast
tomosynthesis was performed. The images were evaluated with
computer-aided detection.

[L CC synth-2D]
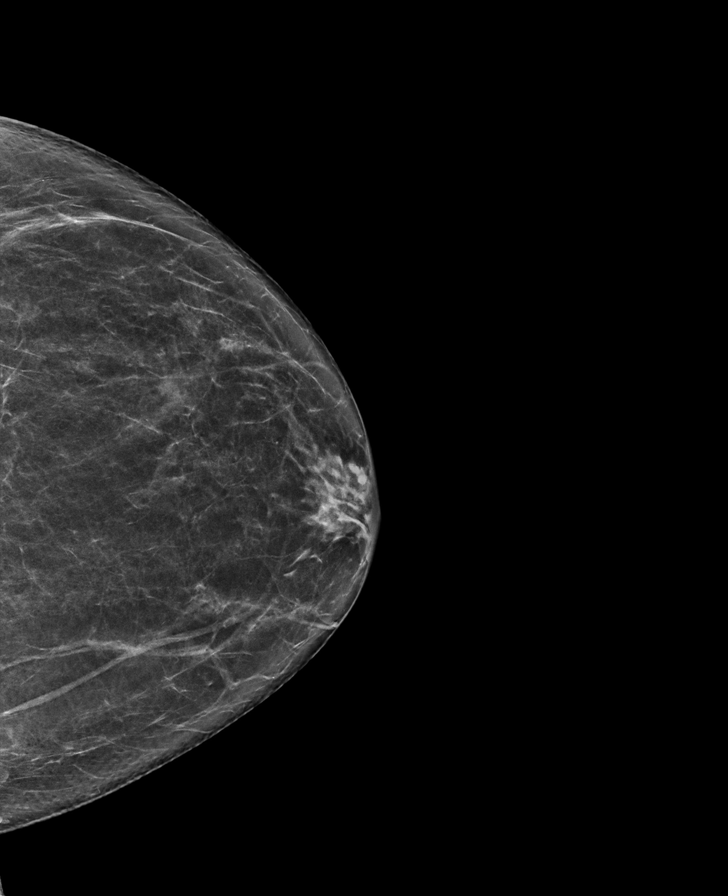

[R MLO synth-2D]
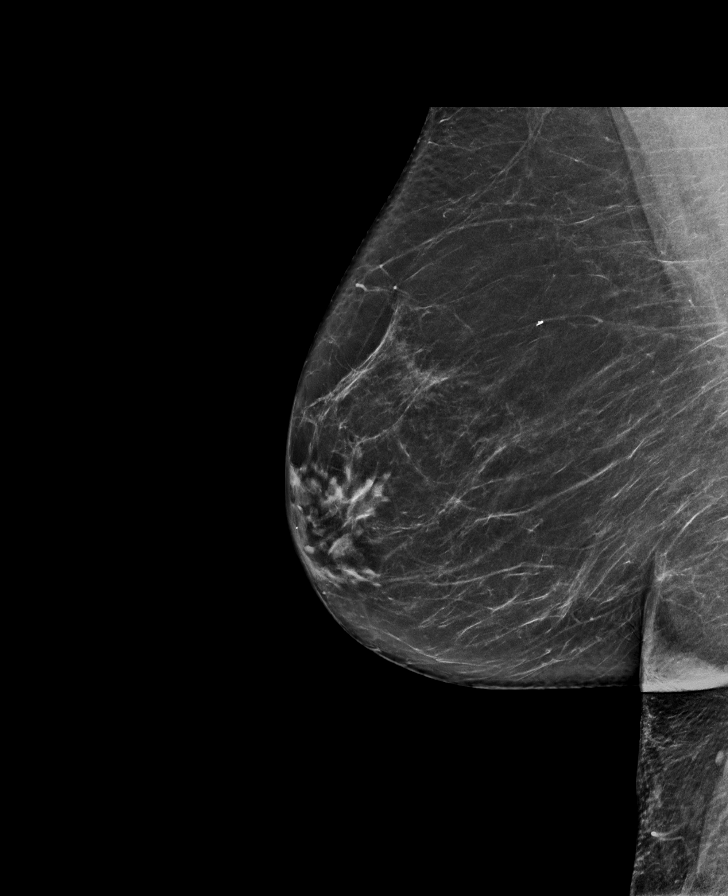

[R CC synth-2D]
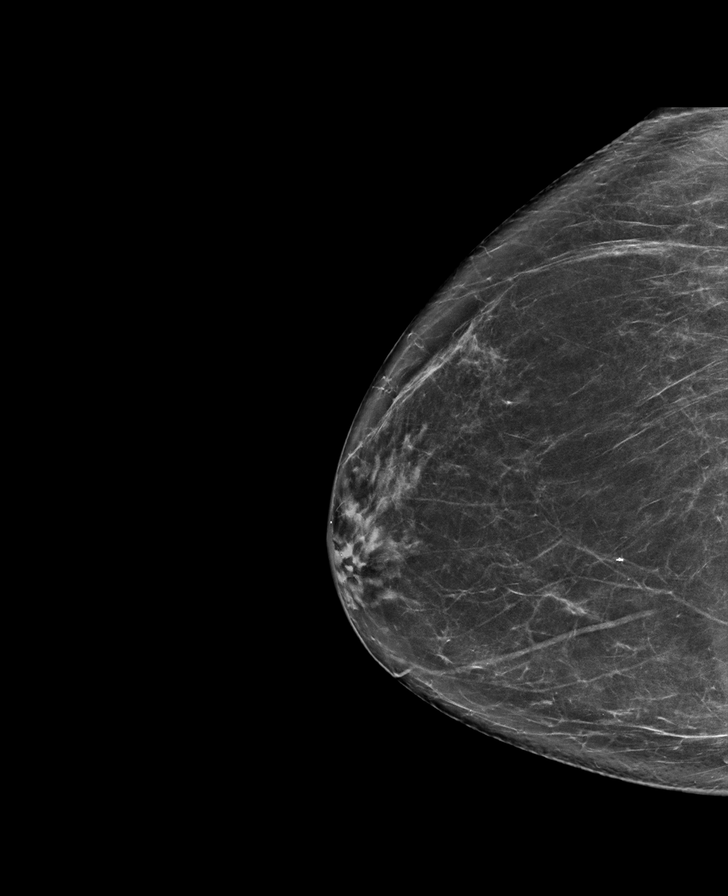

[L MLO synth-2D]
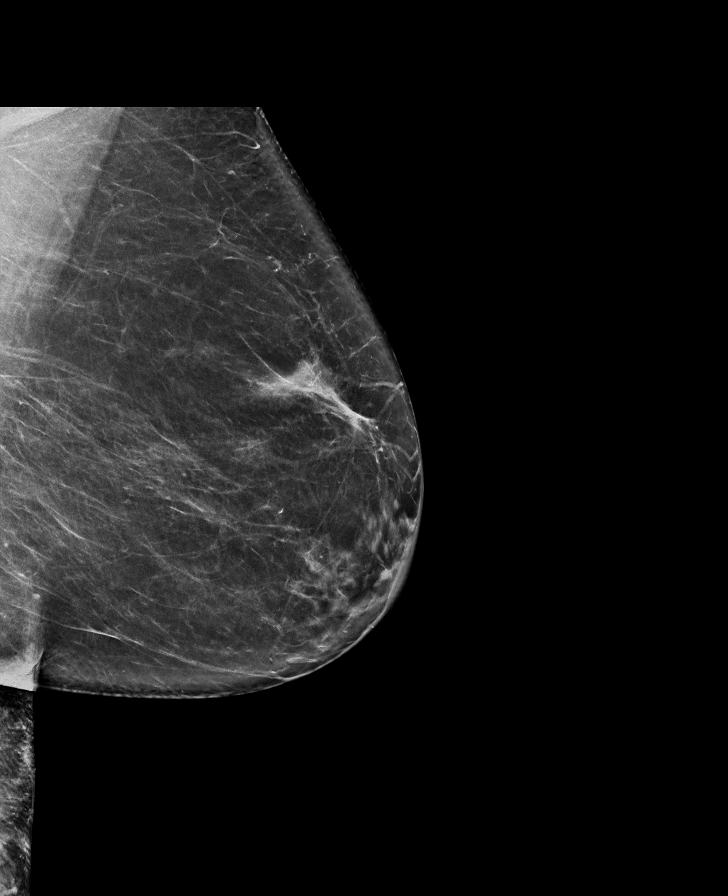

[L CC tomo · tomo slice 36/71.0]
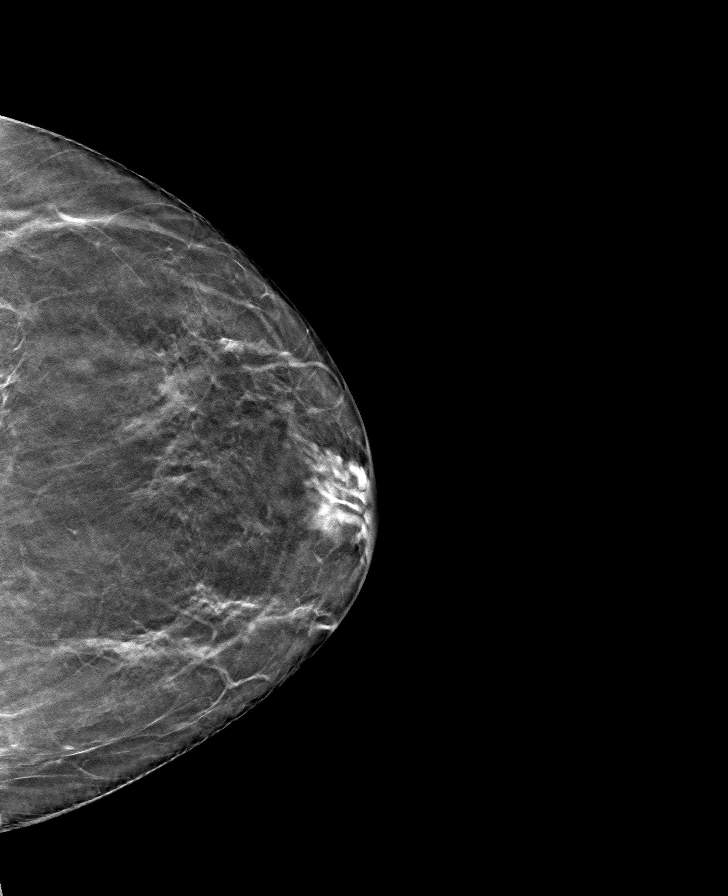

[R CC tomo · tomo slice 39/77.0]
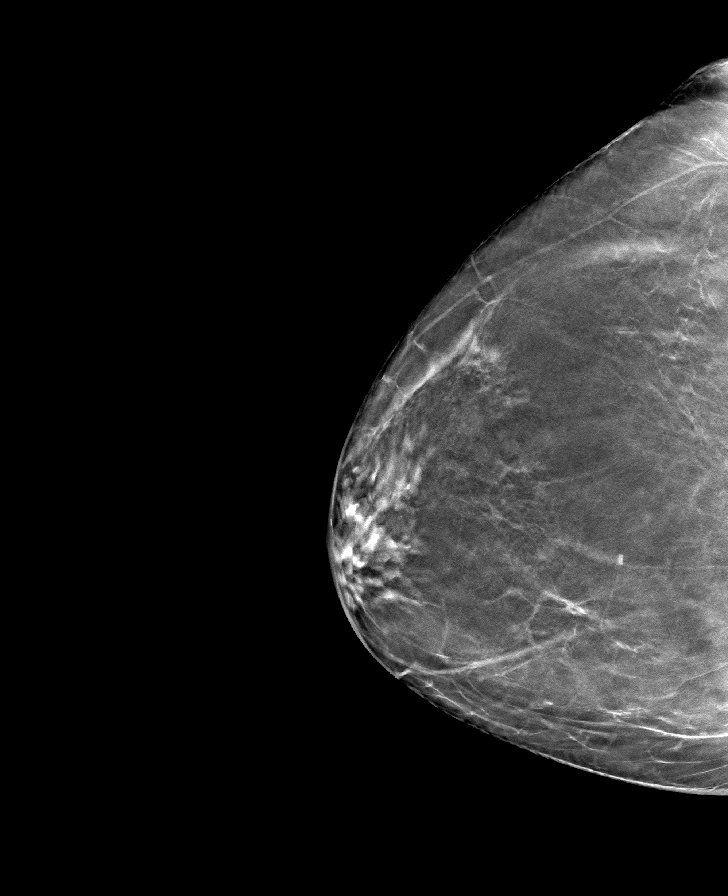

[L MLO tomo · tomo slice 41/82.0]
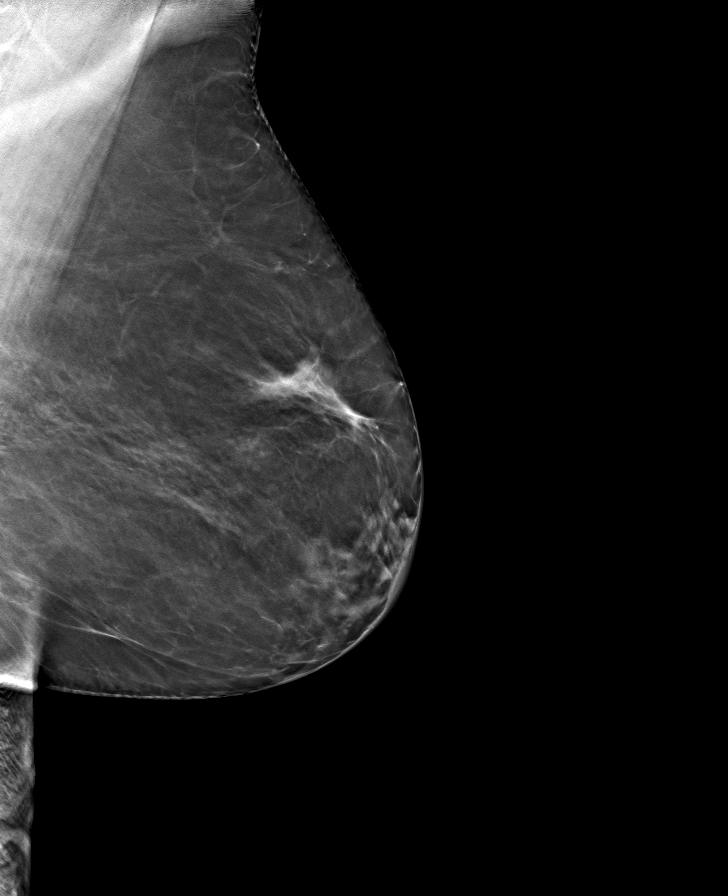

[R MLO tomo · tomo slice 41/81.0]
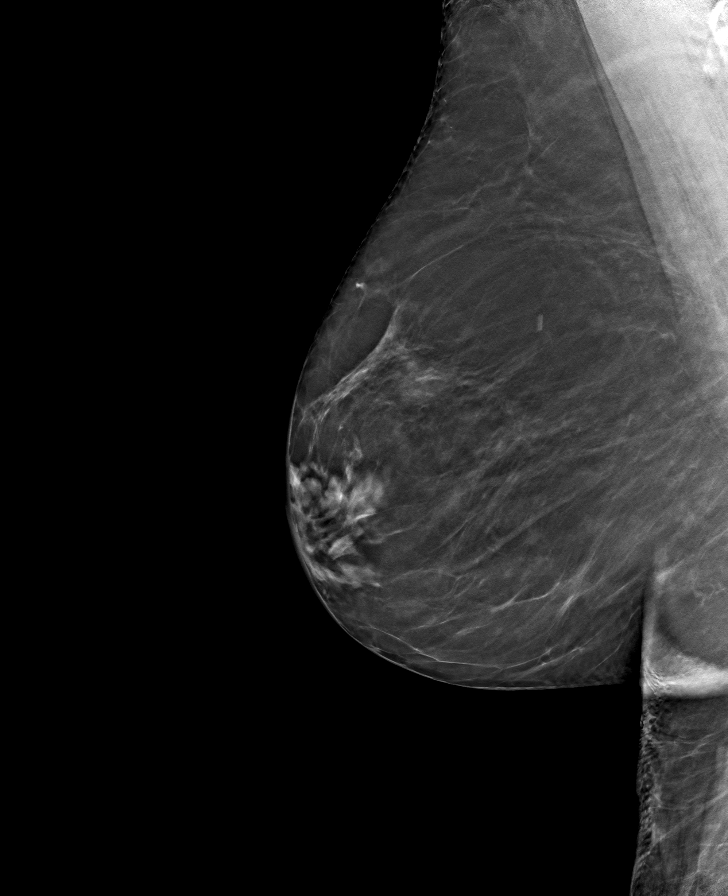

[8 of 24 positions shown; findings below may reference images not displayed]

ACR Breast Density Category b: There are scattered areas of
fibroglandular density.
FINDINGS: There are no findings suspicious for malignancy.
IMPRESSION: No mammographic evidence of malignancy. A result letter of this
screening mammogram will be mailed directly to the patient.

RECOMMENDATION:
Screening mammogram in one year. (Code:51-O-LD2)

BI-RADS CATEGORY  1: Negative.

## 2021-08-20 ENCOUNTER — Telehealth (HOSPITAL_COMMUNITY): Payer: Self-pay | Admitting: Emergency Medicine

## 2021-08-20 NOTE — Telephone Encounter (Signed)
Reaching out to patient to offer assistance regarding upcoming cardiac imaging study; pt verbalizes understanding of appt date/time, parking situation and where to check in, pre-test NPO status and medications ordered, and verified current allergies; name and call back number provided for further questions should they arise ?Rockwell Alexandria RN Navigator Cardiac Imaging ?[facility] ?[phone] office ?[phone] cell ? ?Denies iv issues ?50mg  metoprolol ?Arrival 400 ?

## 2021-08-21 ENCOUNTER — Ambulatory Visit (HOSPITAL_BASED_OUTPATIENT_CLINIC_OR_DEPARTMENT_OTHER): Payer: Medicare Other

## 2021-08-21 ENCOUNTER — Other Ambulatory Visit: Payer: Self-pay

## 2021-08-21 DIAGNOSIS — R931 Abnormal findings on diagnostic imaging of heart and coronary circulation: Secondary | ICD-10-CM | POA: Diagnosis not present

## 2021-08-21 DIAGNOSIS — I4891 Unspecified atrial fibrillation: Secondary | ICD-10-CM | POA: Diagnosis not present

## 2021-08-21 DIAGNOSIS — Z8249 Family history of ischemic heart disease and other diseases of the circulatory system: Secondary | ICD-10-CM | POA: Diagnosis not present

## 2021-08-21 DIAGNOSIS — Z8616 Personal history of COVID-19: Secondary | ICD-10-CM | POA: Diagnosis not present

## 2021-08-21 DIAGNOSIS — R0609 Other forms of dyspnea: Secondary | ICD-10-CM | POA: Diagnosis present

## 2021-08-21 DIAGNOSIS — I25119 Atherosclerotic heart disease of native coronary artery with unspecified angina pectoris: Secondary | ICD-10-CM | POA: Diagnosis not present

## 2021-08-21 DIAGNOSIS — Z87891 Personal history of nicotine dependence: Secondary | ICD-10-CM | POA: Diagnosis not present

## 2021-08-21 DIAGNOSIS — I10 Essential (primary) hypertension: Secondary | ICD-10-CM | POA: Diagnosis not present

## 2021-08-21 DIAGNOSIS — I209 Angina pectoris, unspecified: Secondary | ICD-10-CM | POA: Diagnosis present

## 2021-08-21 LAB — ECHOCARDIOGRAM COMPLETE
Area-P 1/2: 4.18 cm2
S' Lateral: 2.4 cm

## 2021-08-22 ENCOUNTER — Ambulatory Visit (HOSPITAL_COMMUNITY)
Admission: RE | Admit: 2021-08-22 | Discharge: 2021-08-22 | Disposition: A | Discharge status: Home / Self Care | Payer: Medicare Other | Source: Ambulatory Visit | Attending: Cardiology | Admitting: Cardiology

## 2021-08-22 ENCOUNTER — Other Ambulatory Visit (HOSPITAL_COMMUNITY): Payer: Medicare Other

## 2021-08-22 ENCOUNTER — Encounter (HOSPITAL_COMMUNITY): Payer: Self-pay

## 2021-08-22 ENCOUNTER — Ambulatory Visit (HOSPITAL_COMMUNITY)
Admission: RE | Admit: 2021-08-22 | Discharge: 2021-08-22 | Disposition: A | Discharge status: Home / Self Care | Payer: Medicare Other | Source: Ambulatory Visit | Attending: Internal Medicine | Admitting: Internal Medicine

## 2021-08-22 ENCOUNTER — Ambulatory Visit (HOSPITAL_BASED_OUTPATIENT_CLINIC_OR_DEPARTMENT_OTHER)
Admission: RE | Admit: 2021-08-22 | Discharge: 2021-08-22 | Disposition: A | Discharge status: Home / Self Care | Payer: Medicare Other | Source: Ambulatory Visit | Attending: Internal Medicine | Admitting: Internal Medicine

## 2021-08-22 DIAGNOSIS — I2510 Atherosclerotic heart disease of native coronary artery without angina pectoris: Secondary | ICD-10-CM

## 2021-08-22 DIAGNOSIS — R931 Abnormal findings on diagnostic imaging of heart and coronary circulation: Secondary | ICD-10-CM | POA: Diagnosis not present

## 2021-08-22 DIAGNOSIS — I4891 Unspecified atrial fibrillation: Secondary | ICD-10-CM | POA: Insufficient documentation

## 2021-08-22 DIAGNOSIS — Z87891 Personal history of nicotine dependence: Secondary | ICD-10-CM | POA: Insufficient documentation

## 2021-08-22 DIAGNOSIS — I25119 Atherosclerotic heart disease of native coronary artery with unspecified angina pectoris: Secondary | ICD-10-CM | POA: Diagnosis not present

## 2021-08-22 DIAGNOSIS — R0609 Other forms of dyspnea: Secondary | ICD-10-CM | POA: Insufficient documentation

## 2021-08-22 DIAGNOSIS — I209 Angina pectoris, unspecified: Secondary | ICD-10-CM

## 2021-08-22 DIAGNOSIS — I10 Essential (primary) hypertension: Secondary | ICD-10-CM | POA: Insufficient documentation

## 2021-08-22 DIAGNOSIS — Z8616 Personal history of COVID-19: Secondary | ICD-10-CM | POA: Insufficient documentation

## 2021-08-22 DIAGNOSIS — Z8249 Family history of ischemic heart disease and other diseases of the circulatory system: Secondary | ICD-10-CM | POA: Insufficient documentation

## 2021-08-22 MED ORDER — METOPROLOL TARTRATE 5 MG/5ML IV SOLN: 5.0000 mg | INTRAVENOUS | Status: DC | PRN

## 2021-08-22 MED ORDER — NITROGLYCERIN 0.4 MG SL SUBL
SUBLINGUAL_TABLET | SUBLINGUAL | Status: AC
  Filled 2021-08-22: qty 2

## 2021-08-22 MED ORDER — METOPROLOL TARTRATE 5 MG/5ML IV SOLN
INTRAVENOUS | Status: AC
  Filled 2021-08-22: qty 10

## 2021-08-22 MED ORDER — IOHEXOL 350 MG/ML SOLN
100.0000 mL | Freq: Once | INTRAVENOUS | Status: AC | PRN
  Administered 2021-08-22: 100 mL via INTRAVENOUS

## 2021-08-22 MED ORDER — DILTIAZEM HCL 25 MG/5ML IV SOLN: 10.0000 mg | Freq: Once | INTRAVENOUS | Status: AC

## 2021-08-22 MED ORDER — NITROGLYCERIN 0.4 MG SL SUBL
0.8000 mg | SUBLINGUAL_TABLET | Freq: Once | SUBLINGUAL | Status: AC
  Administered 2021-08-22: 0.8 mg via SUBLINGUAL

## 2021-08-22 MED ORDER — DILTIAZEM HCL 25 MG/5ML IV SOLN
INTRAVENOUS | Status: AC
  Administered 2021-08-22: 10 mg via INTRAVENOUS
  Filled 2021-08-22: qty 5

## 2021-08-23 ENCOUNTER — Other Ambulatory Visit: Payer: Self-pay | Admitting: Cardiology

## 2021-08-23 ENCOUNTER — Telehealth: Payer: Self-pay | Admitting: *Deleted

## 2021-08-23 DIAGNOSIS — R931 Abnormal findings on diagnostic imaging of heart and coronary circulation: Secondary | ICD-10-CM

## 2021-08-23 NOTE — Telephone Encounter (Signed)
Below note copied from 08/22/21 Coronary CTA: ? ? ? Jerline Pain, MD  ?08/23/2021 11:34 AM EDT   ?  ?I spoke with Ms. Deriggi about results.  I have gone ahead and discussed with Anderson Malta in Cath Lab and we have her set up for heart catheterization on Monday at 10:30 AM with Dr. Burt Knack.  Orders have been placed. ?I have asked her to hold her Xarelto ?I have asked her to start aspirin 81 mg ?I have called in a prescription for Plavix load 300 mg x 1 and then 75 mg there [facility] [place] [id number], [address] [facility] ?She will have a stat CBC done when she gets here. ?She is currently in [place].  I have informed her not to ride her horse tonight in horse show. ?  ?Candee Furbish, MD  ? ? ?Patient confirmed she will hold Xarelto 08/23/21 until post procedure, she took Plavix 300 mg earlier today, then will take Plavix 75 mg daily, she knows to start aspirin 81 mg daily and did start taking today. ? ? ?I have reviewed instructions with patient and will send copy by MyChart to patient. ?Cardiac Catheterization scheduled at [facility] for: Monday August 27, 2021 10:30 AM ?Arrival time and place: [facility] En[facility] A at: 8 AM-needs CBC ? ? ?No solid food after midnight prior to cath, clear liquids until 5 AM day of procedure. ? ?Medication instructions: ?-Hold: ? Xarelto-Per Dr Ulanda Edison 08/23/21 until post procedure ? HCTZ-AM of procedure ?-Except hold medications usual morning medications can be taken with sips of water including aspirin 81 mg and Plavix 75 mg. ? ?Confirmed patient has responsible adult to drive home post procedure and be with patient first 24 hours after arriving home. ? ?Patient reports no new symptoms concerning for COVID-19/no exposure to COVID-19 in the past 10 days. ? ? ?

## 2021-08-27 ENCOUNTER — Ambulatory Visit (HOSPITAL_COMMUNITY)
Admission: RE | Admit: 2021-08-27 | Discharge: 2021-08-27 | Disposition: A | Discharge status: Home / Self Care | Payer: Medicare Other | Attending: Cardiovascular Disease | Admitting: Cardiovascular Disease

## 2021-08-27 ENCOUNTER — Other Ambulatory Visit: Payer: Self-pay

## 2021-08-27 ENCOUNTER — Encounter (HOSPITAL_COMMUNITY)
Admission: RE | Disposition: A | Discharge status: Home / Self Care | Payer: Self-pay | Source: Home / Self Care | Attending: Cardiovascular Disease

## 2021-08-27 DIAGNOSIS — Z87891 Personal history of nicotine dependence: Secondary | ICD-10-CM | POA: Insufficient documentation

## 2021-08-27 DIAGNOSIS — E785 Hyperlipidemia, unspecified: Secondary | ICD-10-CM | POA: Diagnosis not present

## 2021-08-27 DIAGNOSIS — I480 Paroxysmal atrial fibrillation: Secondary | ICD-10-CM | POA: Diagnosis present

## 2021-08-27 DIAGNOSIS — I25119 Atherosclerotic heart disease of native coronary artery with unspecified angina pectoris: Secondary | ICD-10-CM | POA: Diagnosis present

## 2021-08-27 DIAGNOSIS — I2510 Atherosclerotic heart disease of native coronary artery without angina pectoris: Secondary | ICD-10-CM

## 2021-08-27 DIAGNOSIS — Z7985 Long-term (current) use of injectable non-insulin antidiabetic drugs: Secondary | ICD-10-CM | POA: Insufficient documentation

## 2021-08-27 DIAGNOSIS — I6523 Occlusion and stenosis of bilateral carotid arteries: Secondary | ICD-10-CM | POA: Diagnosis not present

## 2021-08-27 DIAGNOSIS — R0609 Other forms of dyspnea: Secondary | ICD-10-CM | POA: Insufficient documentation

## 2021-08-27 DIAGNOSIS — Z5181 Encounter for therapeutic drug level monitoring: Secondary | ICD-10-CM

## 2021-08-27 DIAGNOSIS — Z955 Presence of coronary angioplasty implant and graft: Secondary | ICD-10-CM | POA: Insufficient documentation

## 2021-08-27 DIAGNOSIS — R931 Abnormal findings on diagnostic imaging of heart and coronary circulation: Secondary | ICD-10-CM

## 2021-08-27 DIAGNOSIS — Z79899 Other long term (current) drug therapy: Secondary | ICD-10-CM | POA: Diagnosis not present

## 2021-08-27 DIAGNOSIS — Z7901 Long term (current) use of anticoagulants: Secondary | ICD-10-CM

## 2021-08-27 DIAGNOSIS — Z8616 Personal history of COVID-19: Secondary | ICD-10-CM | POA: Diagnosis not present

## 2021-08-27 DIAGNOSIS — I209 Angina pectoris, unspecified: Secondary | ICD-10-CM | POA: Diagnosis present

## 2021-08-27 HISTORY — PX: CORONARY STENT INTERVENTION: CATH118234

## 2021-08-27 HISTORY — PX: CORONARY PRESSURE/FFR STUDY: CATH118243

## 2021-08-27 HISTORY — PX: LEFT HEART CATH AND CORONARY ANGIOGRAPHY: CATH118249

## 2021-08-27 LAB — CBC
HCT: 37.1 % (ref 36.0–46.0)
Hemoglobin: 12.5 g/dL (ref 12.0–15.0)
MCH: 30 pg (ref 26.0–34.0)
MCHC: 33.7 g/dL (ref 30.0–36.0)
MCV: 89.2 fL (ref 80.0–100.0)
Platelets: 242 10*3/uL (ref 150–400)
RBC: 4.16 MIL/uL (ref 3.87–5.11)
RDW: 12.1 % (ref 11.5–15.5)
WBC: 5 10*3/uL (ref 4.0–10.5)
nRBC: 0 % (ref 0.0–0.2)

## 2021-08-27 SURGERY — LEFT HEART CATH AND CORONARY ANGIOGRAPHY
Anesthesia: LOCAL

## 2021-08-27 MED ORDER — NITROGLYCERIN 0.4 MG SL SUBL: 0.4000 mg | SUBLINGUAL_TABLET | SUBLINGUAL | 2 refills | Status: AC | PRN

## 2021-08-27 MED ORDER — FENTANYL CITRATE (PF) 100 MCG/2ML IJ SOLN
INTRAMUSCULAR | Status: AC
  Filled 2021-08-27: qty 2

## 2021-08-27 MED ORDER — ACETAMINOPHEN 325 MG PO TABS: 650.0000 mg | ORAL_TABLET | ORAL | Status: DC | PRN

## 2021-08-27 MED ORDER — SODIUM CHLORIDE 0.9% FLUSH: 3.0000 mL | Freq: Two times a day (BID) | INTRAVENOUS | Status: DC

## 2021-08-27 MED ORDER — SODIUM CHLORIDE 0.9 % WEIGHT BASED INFUSION
3.0000 mL/kg/h | INTRAVENOUS | Status: AC
  Administered 2021-08-27: 3 mL/kg/h via INTRAVENOUS

## 2021-08-27 MED ORDER — NITROGLYCERIN 1 MG/10 ML FOR IR/CATH LAB
INTRA_ARTERIAL | Status: DC | PRN
  Administered 2021-08-27: 150 ug via INTRACORONARY

## 2021-08-27 MED ORDER — ADENOSINE 6 MG/2ML IV SOLN
INTRAVENOUS | Status: AC
  Filled 2021-08-27: qty 2

## 2021-08-27 MED ORDER — LIDOCAINE HCL (PF) 1 % IJ SOLN
INTRAMUSCULAR | Status: AC
  Filled 2021-08-27: qty 30

## 2021-08-27 MED ORDER — LABETALOL HCL 5 MG/ML IV SOLN: 10.0000 mg | INTRAVENOUS | Status: DC | PRN

## 2021-08-27 MED ORDER — ONDANSETRON HCL 4 MG/2ML IJ SOLN: 4.0000 mg | Freq: Four times a day (QID) | INTRAMUSCULAR | Status: DC | PRN

## 2021-08-27 MED ORDER — ASPIRIN 81 MG PO CHEW: 81.0000 mg | CHEWABLE_TABLET | ORAL | Status: DC

## 2021-08-27 MED ORDER — IOHEXOL 350 MG/ML SOLN
INTRAVENOUS | Status: DC | PRN
  Administered 2021-08-27: 100 mL

## 2021-08-27 MED ORDER — SODIUM CHLORIDE 0.9% FLUSH: 3.0000 mL | INTRAVENOUS | Status: DC | PRN

## 2021-08-27 MED ORDER — VERAPAMIL HCL 2.5 MG/ML IV SOLN
INTRAVENOUS | Status: AC
  Filled 2021-08-27: qty 2

## 2021-08-27 MED ORDER — CLOPIDOGREL BISULFATE 75 MG PO TABS: 75.0000 mg | ORAL_TABLET | Freq: Every day | ORAL | 1 refills | Status: DC

## 2021-08-27 MED ORDER — VERAPAMIL HCL 2.5 MG/ML IV SOLN
INTRAVENOUS | Status: DC | PRN
  Administered 2021-08-27: 10 mL via INTRA_ARTERIAL

## 2021-08-27 MED ORDER — HEPARIN (PORCINE) IN NACL 1000-0.9 UT/500ML-% IV SOLN
INTRAVENOUS | Status: DC | PRN
  Administered 2021-08-27 (×2): 500 mL

## 2021-08-27 MED ORDER — MIDAZOLAM HCL 2 MG/2ML IJ SOLN
INTRAMUSCULAR | Status: DC | PRN
Start: 2021-08-27 — End: 2021-08-27
  Administered 2021-08-27: 1 mg via INTRAVENOUS
  Administered 2021-08-27: 2 mg via INTRAVENOUS

## 2021-08-27 MED ORDER — SODIUM CHLORIDE 0.9 % WEIGHT BASED INFUSION: 1.0000 mL/kg/h | INTRAVENOUS | Status: DC

## 2021-08-27 MED ORDER — MIDAZOLAM HCL 2 MG/2ML IJ SOLN
INTRAMUSCULAR | Status: AC
  Filled 2021-08-27: qty 2

## 2021-08-27 MED ORDER — HEPARIN (PORCINE) IN NACL 1000-0.9 UT/500ML-% IV SOLN
INTRAVENOUS | Status: AC
  Filled 2021-08-27: qty 1000

## 2021-08-27 MED ORDER — HYDRALAZINE HCL 20 MG/ML IJ SOLN: 10.0000 mg | INTRAMUSCULAR | Status: DC | PRN

## 2021-08-27 MED ORDER — SODIUM CHLORIDE 0.9 % IV SOLN: 250.0000 mL | INTRAVENOUS | Status: DC | PRN

## 2021-08-27 MED ORDER — HEPARIN SODIUM (PORCINE) 1000 UNIT/ML IJ SOLN
INTRAMUSCULAR | Status: DC | PRN
  Administered 2021-08-27 (×2): 4000 [IU] via INTRAVENOUS

## 2021-08-27 MED ORDER — FENTANYL CITRATE (PF) 100 MCG/2ML IJ SOLN
INTRAMUSCULAR | Status: DC | PRN
  Administered 2021-08-27 (×2): 25 ug via INTRAVENOUS

## 2021-08-27 MED ORDER — OXYCODONE HCL 5 MG PO TABS: 5.0000 mg | ORAL_TABLET | ORAL | Status: DC | PRN

## 2021-08-27 MED ORDER — NITROGLYCERIN 1 MG/10 ML FOR IR/CATH LAB
INTRA_ARTERIAL | Status: AC
  Filled 2021-08-27: qty 10

## 2021-08-27 MED ORDER — LIDOCAINE HCL (PF) 1 % IJ SOLN
INTRAMUSCULAR | Status: DC | PRN
  Administered 2021-08-27: 2 mL

## 2021-08-27 MED ORDER — HEPARIN SODIUM (PORCINE) 1000 UNIT/ML IJ SOLN
INTRAMUSCULAR | Status: AC
  Filled 2021-08-27: qty 10

## 2021-08-27 SURGICAL SUPPLY — 20 items
BALLN SAPPHIRE 2.5X15 (BALLOONS) ×2
BALLN ~~LOC~~ EUPHORA RX 2.75X12 (BALLOONS) ×2
BALLOON SAPPHIRE 2.5X15 (BALLOONS) IMPLANT
BALLOON ~~LOC~~ EUPHORA RX 2.75X12 (BALLOONS) IMPLANT
BAND ZEPHYR COMPRESS 30 LONG (HEMOSTASIS) ×1 IMPLANT
CATH DIAG 6FR JR4 (CATHETERS) ×1 IMPLANT
CATH INFINITI 6F FL3.5 (CATHETERS) ×1 IMPLANT
CATH LAUNCHER 5F EBU3.5 (CATHETERS) ×1 IMPLANT
GLIDESHEATH SLEND SS 6F .021 (SHEATH) ×1 IMPLANT
GUIDEWIRE INQWIRE 1.5J.035X260 (WIRE) IMPLANT
GUIDEWIRE PRESSURE X 175 (WIRE) ×1 IMPLANT
INQWIRE 1.5J .035X260CM (WIRE) ×2
KIT ENCORE 26 ADVANTAGE (KITS) ×1 IMPLANT
KIT ESSENTIALS PG (KITS) ×1 IMPLANT
KIT HEART LEFT (KITS) ×2 IMPLANT
PACK CARDIAC CATHETERIZATION (CUSTOM PROCEDURE TRAY) ×2 IMPLANT
STENT SYNERGY XD 2.50X20 (Permanent Stent) IMPLANT
SYNERGY XD 2.50X20 (Permanent Stent) ×2 IMPLANT
TRANSDUCER W/STOPCOCK (MISCELLANEOUS) ×2 IMPLANT
TUBING CIL FLEX 10 FLL-RA (TUBING) ×2 IMPLANT

## 2021-08-27 NOTE — Discharge Summary (Signed)
?Discharge Summary for Same Day PCI  ? ?Patient ID: Brenton Grills ?MRN: [id number]; DOB: Jul 21, 1945 ? ?Admit date: 08/27/2021 ?Discharge date: 08/27/2021 ? ?Primary Care Provider: Caren Macadam, MD  ?Primary Cardiologist: Candee Furbish, MD  ?Primary Electrophysiologist:  None  ? ?Discharge Diagnoses  ?  ?Principal Problem: ?  Abnormal findings diagnostic imaging of heart and coronary circulation ?Active Problems: ?  Paroxysmal atrial fibrillation ([place]) ?  Chronic anticoagulation ?  Angina pectoris ([place]) ?  Coronary artery disease involving native coronary artery of native heart with angina pectoris ([place]) ? ? ? ?Diagnostic Studies/Procedures  ?  ?Cardiac Catheterization 08/27/2021: ? ?Severe stenosis of the LAD just after the first septal perforator, treated successfully with pressure wire guided PCI using a 2.5 x 20 mm Synergy DES ?Widely patent left main, left circumflex, and RCA with mild nonobstructive plaquing ?  ?Recommendations: Continue clopidogrel x6 months, resume rivaroxaban tomorrow.  Would avoid aspirin in the setting of chronic oral anticoagulation plus clopidogrel in order to avoid excessive bleeding risk. ?  ? ?Diagnostic ?Dominance: Right ?Intervention ? ? ?_____________ ?  ?History of Present Illness   ?  ?Jazalynn Deonne Killelea is a 76 y.o. female with PMH of paroxysmal afib and coronary calcium score of 711 01/2020, HLD and mild carotid artery disease who was referred to Dr. Marlou Porch for evaluation of shortness of breath.  ? ?Over the last few weeks prior to office visit with increasing frequency, she has been noticing more shortness of breath with more minimal physical activity.  For instance, while at a riding lesson, it does not take much at all for her to have to stop and catch her breath.  This was not usual for her. Had COVID 02/2021 - low energy for weeks. Was not active at the time. Walking more. Not going to the gym. Some weights and streching.  3 times a week ride.  ?  ?Very brief epidoses if any  very rare. Apple watch no AFIB.  ?  ?Back in July 2020 while at a horse show ([place] she had an episode of shortness of breath, presyncope and was taken to the emergency room was found to be in atrial fibrillation with rapid ventricular response.  At the time she was treated with IV metoprolol and auto converted.  Lab work was unremarkable at that time. ?  ?In 01/11/2020 she had an echocardiogram which showed normal pump function. ?  ?A coronary calcium score was performed on 01/11/2020 with a score of 711 and therefore she was started on atorvastatin.  Her LDL was reduced from 97 down to 55.  She was not having any anginal symptoms at the time of her calcium score. ?  ?Her father had CABG in his 22s.  She enjoys golf, very active.  She was retired from [employer] . ?  ?Occasional she will have nuisance bleeding from Xarelto. She underwent outpatient coronary CT with coronary calcium score of 967 severe stenosis of mLAD. It was recommended that she undergo cardiac cath as an outpatient. Loaded with plavix and placed on 75mg  daily.  ?  ?Hospital Course  ?   ?The patient underwent cardiac cath as noted above with severe stenosis of the LAD treated successfully with pressure wire guided PCI with DESx1. Widely patent LM, Lcx, RCA with mild nonobstructive plaque. Plan for DAPT with ASA/plavix for at least 6 months. No ASA with the need for [place]. The patient was seen by cardiac rehab while in short stay. There were no  observed complications post cath. Radial cath site was re-evaluated prior to discharge and found to be stable without any complications. Instructions/precautions regarding cath site care were given prior to discharge. ? ?Lyndi Limas was seen by Dr. Burt Knack and determined stable for discharge home. Follow up with our office has been arranged. Medications are listed below. Pertinent changes include n/a. ? ?_____________ ? ?Cath/PCI Registry Performance & Quality Measures: ?Aspirin  prescribed? - No - on Xarelto ?ADP Receptor Inhibitor (Plavix/Clopidogrel, Brilinta/Ticagrelor or Effient/Prasugrel) prescribed (includes medically managed patients)? - Yes ?High Intensity Statin (Lipitor 40-80mg  or Crestor 20-40mg ) prescribed? - Yes ?For EF <40%, was ACEI/ARB prescribed? - Not Applicable (EF >/= [id number]) ?For EF <40%, Aldosterone Antagonist (Spironolactone or Eplerenone) prescribed? - Not Applicable (EF >/= [id number]) ?Cardiac Rehab Phase II ordered (Included Medically managed Patients)? - Yes ? ?_____________ ? ? ?Discharge Vitals ?Blood pressure 126/90, pulse 80, temperature 98.4 ?F (36.9 ?C), temperature source Oral, resp. rate 19, height 5\' 3"  (1.6 m), weight 81.6 kg, SpO2 97 %.  ?Filed Weights  ? 08/27/21 0800  ?Weight: 81.6 kg  ? ? ?Last Labs & Radiologic Studies  ?  ?CBC ?Recent Labs  ?  08/27/21 ?0823  ?WBC 5.0  ?HGB 12.5  ?HCT 37.1  ?MCV 89.2  ?PLT 242  ? ?Basic Metabolic Panel ?No results for input(s): NA, K, CL, CO2, GLUCOSE, BUN, CREATININE, CALCIUM, MG, PHOS in the last 72 hours. ?Liver Function Tests ?No results for input(s): AST, ALT, ALKPHOS, BILITOT, PROT, ALBUMIN in the last 72 hours. ?No results for input(s): LIPASE, AMYLASE in the last 72 hours. ?High Sensitivity Troponin:   ?No results for input(s): TROPONINIHS in the last 720 hours.  ?BNP ?Invalid input(s): POCBNP ?D-Dimer ?No results for input(s): DDIMER in the last 72 hours. ?Hemoglobin A1C ?No results for input(s): HGBA1C in the last 72 hours. ?Fasting Lipid Panel ?No results for input(s): CHOL, HDL, LDLCALC, TRIG, CHOLHDL, LDLDIRECT in the last 72 hours. ?Thyroid Function Tests ?No results for input(s): TSH, T4TOTAL, T3FREE, THYROIDAB in the last 72 hours. ? ?Invalid input(s): FREET3 ?_____________  ?CARDIAC CATHETERIZATION ? ?Result Date: 08/27/2021 ?Severe stenosis of the LAD just after the first septal perforator, treated successfully with pressure wire guided PCI using a 2.5 x 20 mm Synergy DES Widely patent left main, left  circumflex, and RCA with mild nonobstructive plaquing Recommendations: Continue clopidogrel x6 months, resume rivaroxaban tomorrow.  Would avoid aspirin in the setting of chronic oral anticoagulation plus clopidogrel in order to avoid excessive bleeding risk.  ? ?CT CORONARY MORPH W/CTA COR W/SCORE W/CA W/CM &/OR WO/CM ? ?Addendum Date: 08/22/2021   ?ADDENDUM REPORT: 08/22/2021 22:24 CLINICAL DATA:  76 year old White Female EXAM: Cardiac/Coronary  CTA TECHNIQUE: The patient was scanned on a [employer]. FINDINGS: Scan was triggered in the descending thoracic aorta. Axial non-contrast 3 mm slices were carried out through the heart. The data set was analyzed on a dedicated work station and scored using the [employer]. Gantry rotation speed was 250 msecs and collimation was .6 mm. 0.8 mg of sl NTG was given. The 3D data set was reconstructed in 5% intervals of the 67-82 % of the R-R cycle. Diastolic phases were analyzed on a dedicated work station using MPR, MIP and VRT modes. The patient received 100 cc of contrast. Aorta:  Normal size.  Aortic atherosclerosis.  No dissection. Main Pulmonary Artery: Normal size of the pulmonary artery. Aortic Valve:  Tri-leaflet.  No calcifications. Coronary Arteries:  Normal coronary origin.  Right dominance.  Coronary Calcium Score: Left main: 0 Left anterior descending artery: 270 Left circumflex artery: 17 Right coronary artery: 680 Total: 967 Percentile: 95th for age, sex, and race matched control. RCA is a large dominant artery that gives rise to PDA and PLA. Mild calcified plaque in the middle and distal vessel. Left main is a large artery that gives rise to LAD and LCX arteries. Mild distal calcified plaque. LAD is a large vessel that gives rise to one large D1 Branch. Severe calcified stenosis mid LAD followed by tandem mild calcified lesion. Minimal calcified proximal plaque. Mild calcified plaque in the proximal D1. LCX is a non-dominant artery that gives rise  to one large OM1 branch. Mild soft plaque in proximal OM1. Other findings: Normal pulmonary vein drainage into the left atrium. Normal left atrial appendage without a thrombus. Extra-cardiac findings: See

## 2021-08-27 NOTE — Interval H&P Note (Signed)
Cath Lab Visit (complete for each Cath Lab visit) ? ?Clinical Evaluation Leading to the Procedure:  ? ?ACS: No. ? ?Non-ACS:   ? ?Anginal Classification: CCS III ? ?Anti-ischemic medical therapy: Maximal Therapy (2 or more classes of medications) ? ?Non-Invasive Test Results: Intermediate-risk stress test findings: cardiac mortality 1-3%/year ? ?Prior CABG: No previous CABG ? ? ? ? ? ?History and Physical Interval Note: ? ?08/27/2021 ?9:55 AM ? ?Colleen Cole  has presented today for surgery, with the diagnosis of CAD.  The various methods of treatment have been discussed with the patient and family. After consideration of risks, benefits and other options for treatment, the patient has consented to  Procedure(s): ?LEFT HEART CATH AND CORONARY ANGIOGRAPHY (N/A) as a surgical intervention.  The patient's history has been reviewed, patient examined, no change in status, stable for surgery.  I have reviewed the patient's chart and labs.  Questions were answered to the patient's satisfaction.   ? ? ?Tonny Bollman ? ? ?

## 2021-08-27 NOTE — Progress Notes (Signed)
CARDIAC REHAB PHASE I  ? ?Stent education completed with pt. Pt educated on importance of Plavix. Pt given stent card along with heart healthy diet. Reviewed site care, restrictions, and exercise guidelines. Will refer to CRP II [place]. ? ?[id number] ?Reynold Bowen, RN BSN ?08/27/2021 ?12:00 PM ? ?

## 2021-08-28 ENCOUNTER — Encounter (HOSPITAL_COMMUNITY): Payer: Self-pay | Admitting: Cardiovascular Disease

## 2021-08-29 LAB — POCT ACTIVATED CLOTTING TIME: Activated Clotting Time: 293 seconds

## 2021-09-03 NOTE — Progress Notes (Signed)
?Cardiology Office Note:   ? ?Date:  09/04/2021  ? ?ID:  Colleen Cole, DOB 1945/07/25, MRN [id number] ? ?PCP:  Colleen Macadam, MD ?  ?[facility] Providers ?Cardiologist:  Colleen Furbish, MD ?Click to update primary MD,subspecialty MD or APP then REFRESH:1}   ? ?Referring MD: Colleen Macadam, MD  ? ?Chief Complaint: follow-up CAD s/p PCI/DES to LAD ? ?History of Present Illness:   ? ?Colleen Cole is a very pleasant 76 y.o. female with a hx of CAD s/p PCI/DES to LAD, elevated coronary calcium score, PAF, hypertension, hyperlipidemia, and right carotid bruit with no stenosis on ultrasound.  ? ?Referred to cardiology by PCP for evaluation of PAF, initially seen by  Dr. Gwenlyn Cole on 12/24/19. She was started on anticoagulation.  Calcium score 711, 93rd percentile for age and sex matched control, calcification noted in all 3 coronary distributions and distal left main, aortic atherosclerosis noted on CT 01/2020. She maintained consistent follow-up and in 10/2020, requested to switch to Dr. Marlou Cole.   ?Coronary CTA was ordered 08/22/2021 which revealed evidence of significant functional stenosis mid LAD, no significant functional stenosis LCx, RCA.  Patient was scheduled for coronary catheterization on 08/27/21 which revealed severe stenosis LAD, treated successfully with pressure wire guided PCI and DES.  Widely patent left main, LCx and RCA with mild nonobstructive plaque.  Appointment 08/14/2021 with Dr. Marlou Cole, she reported increasing shortness of breath with minimal more physical activity.  She reported rare episodes of A-fib if any at all. recommended clopidogrel x 6 months, resume Xarelto the following day, avoid ASA in setting of chronic AC.  She was discharged without complication. ? ?Today, she is here alone for follow-up.  Reports she is doing very well and is anxious to get back to her regular activities.  Prior to cath her symptoms of angina were dyspnea on exertion with minimal activity worsening over a short  period of time.  Today, she reports she is walking 15 minutes 2 times a day without symptoms of dyspnea or chest pain.  Feels ready to progress to a longer walk as well as walk on an incline.Would also like to play golf and ride her horse. She denies chest pain, shortness of breath, lower extremity edema, fatigue, palpitations, melena, hematuria, hemoptysis, diaphoresis, weakness, presyncope, syncope, orthopnea, and PND. She is excited to go to Cardiac rehab. We discussed specific activities such as horseback riding with Dr. Marlou Cole who advised her on gradually returning to these regular activities.  ? ?Past Medical History:  ?Diagnosis Date  ? Hypertension   ? ? ?Past Surgical History:  ?Procedure Laterality Date  ? BREAST EXCISIONAL BIOPSY Right 1995  ? NEG  ? CORONARY STENT INTERVENTION N/A 08/27/2021  ? Procedure: CORONARY STENT INTERVENTION;  Surgeon: Colleen Mocha, MD;  Location: [facility];  Service: Cardiovascular;  Laterality: N/A;  ? FRACTURE SURGERY  2007  ? fracture in left leg-at patella, tibia and fibula  ? INTRAVASCULAR PRESSURE WIRE/FFR STUDY N/A 08/27/2021  ? Procedure: INTRAVASCULAR PRESSURE WIRE/FFR STUDY;  Surgeon: Colleen Mocha, MD;  Location: [facility];  Service: Cardiovascular;  Laterality: N/A;  ? LEFT HEART CATH AND CORONARY ANGIOGRAPHY N/A 08/27/2021  ? Procedure: LEFT HEART CATH AND CORONARY ANGIOGRAPHY;  Surgeon: Colleen Mocha, MD;  Location: [facility];  Service: Cardiovascular;  Laterality: N/A;  ? TOTAL HIP ARTHROPLASTY Right 08/04/2018  ? Procedure: TOTAL HIP ARTHROPLASTY ANTERIOR APPROACH;  Surgeon: Colleen Nakayama, MD;  Location: [facility];  Service: Orthopedics;  Laterality:  Right;  ? ? ?Current Medications: ?Current Meds  ?Medication Sig  ? acetaminophen (TYLENOL) 500 MG tablet Take 1,000 mg by mouth every 6 (six) hours as needed for mild pain or headache.  ? alendronate (FOSAMAX) 70 MG tablet Take 70 mg by mouth every Sunday.  ? amLODipine (NORVASC) 5 MG  tablet Take 10 mg by mouth daily.  ? Calcium Carb-Cholecalciferol (CALCIUM 600 + D PO) Take 1 tablet by mouth 2 (two) times daily.  ? clopidogrel (PLAVIX) 75 MG tablet Take 1 tablet (75 mg total) by mouth daily. Take 300 mg on the first day then take 75 mg daily until 3/27  ? Docusate Calcium (STOOL SOFTENER PO) Take 2 capsules by mouth daily as needed (constipation).  ? escitalopram (LEXAPRO) 5 MG tablet Take 5 mg by mouth daily.  ? fluticasone (FLONASE) 50 MCG/ACT nasal spray Place 1 spray into both nostrils daily as needed for allergies or rhinitis.  ? hydrALAZINE (APRESOLINE) 25 MG tablet Take 1 tablet (25 mg total) by mouth 2 (two) times daily. (Patient taking differently: Take 25-50 mg by mouth See admin instructions. Take 50 mg in the morning, 25 mg in the afternoon, and 50 mg at night)  ? hydrochlorothiazide (HYDRODIURIL) 25 MG tablet Take 25 mg by mouth daily.  ? levocetirizine (XYZAL) 5 MG tablet Take 5 mg by mouth daily as needed for allergies.  ? losartan (COZAAR) 100 MG tablet Take 100 mg by mouth every evening.  ? metoprolol tartrate (LOPRESSOR) 25 MG tablet TAKE 1 TABLET(25 MG) BY MOUTH TWICE DAILY  ? nitroGLYCERIN (NITROSTAT) 0.4 MG SL tablet Place 1 tablet (0.4 mg total) under the tongue every 5 (five) minutes as needed.  ? OZEMPIC, 1 MG/DOSE, 4 MG/3ML SOPN Inject 1 mg into the skin every Thursday.  ? rosuvastatin (CRESTOR) 20 MG tablet Take 1 tablet (20 mg total) by mouth daily. (Patient taking differently: Take 20 mg by mouth every evening.)  ? VITAMIN D PO Take 1 capsule by mouth daily.  ? XARELTO 20 MG TABS tablet Take 1 tablet (20 mg total) by mouth daily.  ?  ? ?Allergies:   Patient has no known allergies.  ? ?Social History  ? ?Socioeconomic History  ? Marital status: Single  ?  Spouse name: Not on file  ? Number of children: Not on file  ? Years of education: Not on file  ? Highest education level: Not on file  ?Occupational History  ? Not on file  ?Tobacco Use  ? Smoking status: Former  ?   Types: Cigarettes  ?  Quit date: 07/13/1998  ?  Years since quitting: 23.1  ? Smokeless tobacco: Never  ?Vaping Use  ? Vaping Use: Never used  ?Substance and Sexual Activity  ? Alcohol use: Yes  ?  Comment: occassionally  ? Drug use: Never  ? Sexual activity: Not on file  ?Other Topics Concern  ? Not on file  ?Social History Narrative  ? Not on file  ? ?Social Determinants of Health  ? ?Financial Resource Strain: Not on file  ?Food Insecurity: Not on file  ?Transportation Needs: Not on file  ?Physical Activity: Not on file  ?Stress: Not on file  ?Social Connections: Not on file  ?  ? ?Family History: ?The patient's family history is negative for Breast cancer. ? ?ROS:   ?Please see the history of present illness. All other systems reviewed and are negative. ? ?Labs/Other Studies Reviewed:   ? ?The following studies were reviewed today: ? ?LHC  08/27/21 ? ?Severe stenosis of the LAD just after the first septal perforator, treated successfully with pressure wire guided PCI using a 2.5 x 20 mm Synergy DES ?Widely patent left main, left circumflex, and RCA with mild nonobstructive plaquing ?  ?Recommendations: Continue clopidogrel x6 months, resume rivaroxaban tomorrow.  Would avoid aspirin in the setting of chronic oral anticoagulation plus clopidogrel in order to avoid excessive bleeding risk. ? ?Cor CT 08/22/21 ? ?1. Coronary calcium score of 967. This was 95th percentile for age, ?sex, and race matched control. ?  ?2. Normal coronary origin with right dominance. ?  ?3. CAD-RADS 4 Severe stenosis. (70-99% or > 50% left main). Cardiac ?catheterization or CT FFR is recommended. Consider symptom-guided ?anti-ischemic pharmacotherapy as well as risk factor modification ?per guideline directed care. ? ?IMPRESSION: ?1. CT FFR analysis shows evidence of significant functional stenosis ?-mid LAD ? ?Echo 08/21/21 ? ?Left Ventricle: Left ventricular ejection fraction, by estimation, is 60  ?to 65%. The left ventricle has normal  function. The left ventricle has no  ?regional wall motion abnormalities. The average left ventricular global  ?longitudinal strain is 27.3 %. The  ? global longitudinal strain is normal. The left ventricular

## 2021-09-04 ENCOUNTER — Ambulatory Visit (INDEPENDENT_AMBULATORY_CARE_PROVIDER_SITE_OTHER): Payer: Medicare Other | Admitting: Nurse Practitioner

## 2021-09-04 ENCOUNTER — Encounter: Payer: Self-pay | Admitting: *Deleted

## 2021-09-04 ENCOUNTER — Encounter: Payer: Self-pay | Admitting: Nurse Practitioner

## 2021-09-04 ENCOUNTER — Telehealth (HOSPITAL_COMMUNITY): Payer: Self-pay

## 2021-09-04 VITALS — BP 120/70 | HR 88 | Ht 63.0 in | Wt 184.0 lb

## 2021-09-04 DIAGNOSIS — I10 Essential (primary) hypertension: Secondary | ICD-10-CM

## 2021-09-04 DIAGNOSIS — I480 Paroxysmal atrial fibrillation: Secondary | ICD-10-CM

## 2021-09-04 DIAGNOSIS — Z7901 Long term (current) use of anticoagulants: Secondary | ICD-10-CM

## 2021-09-04 DIAGNOSIS — R0609 Other forms of dyspnea: Secondary | ICD-10-CM

## 2021-09-04 DIAGNOSIS — I2510 Atherosclerotic heart disease of native coronary artery without angina pectoris: Secondary | ICD-10-CM | POA: Diagnosis not present

## 2021-09-04 DIAGNOSIS — E785 Hyperlipidemia, unspecified: Secondary | ICD-10-CM | POA: Diagnosis not present

## 2021-09-04 DIAGNOSIS — Z955 Presence of coronary angioplasty implant and graft: Secondary | ICD-10-CM

## 2021-09-04 NOTE — Patient Instructions (Signed)
Medication Instructions:  ? ?Your physician recommends that you continue on your current medications as directed. Please refer to the Current Medication list given to you today. ? ? ?*If you need a refill on your cardiac medications before your next appointment, please call your pharmacy* ? ?Follow-Up: ?At [facility], you and your health needs are our priority.  As part of our continuing mission to provide you with exceptional heart care, we have created designated Provider Care Teams.  These Care Teams include your primary Cardiologist (physician) and Advanced Practice Providers (APPs -  Physician Assistants and Nurse Practitioners) who all work together to provide you with the care you need, when you need it. ? ?We recommend signing up for the patient portal called "MyChart".  Sign up information is provided on this After Visit Summary.  MyChart is used to connect with patients for Virtual Visits (Telemedicine).  Patients are able to view lab/test results, encounter notes, upcoming appointments, etc.  Non-urgent messages can be sent to your provider as well.   ?To learn more about what you can do with MyChart, go to [email or web address].   ? ?Your next appointment:   ?1 month(s) ? ?The format for your next appointment:   ?In Person ? ?Provider:   ?Donato Schultz, MD   ? ? ?

## 2021-09-04 NOTE — Telephone Encounter (Signed)
Pt insurance is active and benefits verified through Medicare a/b Co-pay 0, DED $226/$226 met, out of pocket 0/0 met, co-insurance 20%. no pre-authorization required. Passport, 09/04/2021@3 :00pm, REF# [id number] ?  ?2ndary insurance is active and benefits verified through [employer]. Co-pay 0, DED 0/0 met, out of pocket 0/0 met, co-insurance 0. No pre-authorization required.  ?  ?Will contact patient to see if she is interested in the Cardiac Rehab Program. If interested, patient will need to complete follow up appt. Once completed, patient will be contacted for scheduling upon review by the RN Navigator. ?

## 2021-09-04 NOTE — Progress Notes (Signed)
Patient ID: Colleen Cole, female   DOB: 01-17-1946, 76 y.o.   MRN: [id number] ?ZIO XT serial # [id number] returned to [facility] unused.  Order cancelled.  Monitor no longer necessary since patient had Cath. ?

## 2021-09-04 NOTE — Telephone Encounter (Signed)
Attempted to call patient in regards to Cardiac Rehab - LM on VM 

## 2021-09-06 ENCOUNTER — Encounter: Payer: Self-pay | Admitting: *Deleted

## 2021-09-06 ENCOUNTER — Ambulatory Visit (INDEPENDENT_AMBULATORY_CARE_PROVIDER_SITE_OTHER): Payer: Medicare Other | Admitting: Cardiology

## 2021-09-06 ENCOUNTER — Encounter: Payer: Self-pay | Admitting: Cardiology

## 2021-09-06 VITALS — BP 120/68 | HR 101 | Ht 63.0 in | Wt 180.6 lb

## 2021-09-06 DIAGNOSIS — Z01812 Encounter for preprocedural laboratory examination: Secondary | ICD-10-CM | POA: Diagnosis not present

## 2021-09-06 DIAGNOSIS — I25119 Atherosclerotic heart disease of native coronary artery with unspecified angina pectoris: Secondary | ICD-10-CM | POA: Diagnosis not present

## 2021-09-06 DIAGNOSIS — I480 Paroxysmal atrial fibrillation: Secondary | ICD-10-CM

## 2021-09-06 DIAGNOSIS — I209 Angina pectoris, unspecified: Secondary | ICD-10-CM | POA: Diagnosis not present

## 2021-09-06 DIAGNOSIS — D6869 Other thrombophilia: Secondary | ICD-10-CM | POA: Diagnosis not present

## 2021-09-06 MED ORDER — AMIODARONE HCL 200 MG PO TABS: ORAL_TABLET | ORAL | 3 refills | Status: DC

## 2021-09-06 NOTE — Patient Instructions (Addendum)
Medication Instructions:  ?Please start Amiodarone 200 mg - take one tablet twice a day for 2 weeks then decrease to one tablet daily. ?Continue all other medications as listed. ? ?*If you need a refill on your cardiac medications before your next appointment, please call your pharmacy* ? ?Lab Work: ?none ?If you have labs (blood work) drawn today and your tests are completely normal, you will receive your results only by: ?MyChart Message (if you have MyChart) OR ?A paper copy in the mail ?If you have any lab test that is abnormal or we need to change your treatment, we will call you to review the results. ? ?Testing/Procedures: ?Your physician has requested that you have a TEE/Cardioversion. During a TEE, sound waves are used to create images of your heart. It provides your doctor with information about the size and shape of your heart and how well your heart?s chambers and valves are working. In this test, a transducer is attached to the end of a flexible tube that is guided down you throat and into your esophagus (the tube leading from your mouth to your stomach) to get a more detailed image of your heart. Once the TEE has determined that a blood clot is not present, the cardioversion begins. Electrical Cardioversion uses a jolt of electricity to your heart either through paddles or wired patches attached to your chest. This is a controlled, usually prescheduled, procedure. This procedure is done at the hospital and you are not awake during the procedure. You usually go home the day of the procedure. Please see the instruction sheet given to you today for more information. ? ? ? ?[facility] ?[facility] [address] [facility] ?[address], [address] ?[place] [address] ?Dept: [phone] ?Loc: [phone] ? ?Xana Bradt  09/06/2021 ? ?You are scheduled for a TEE/cardioversion  on Wednesday, April 12 with Dr. Laurance Flatten. ? ?1. Please arrive at  the [facility] (Main Entrance A) at [facility]: [address] [place], [place] [address] at 8:00 AM . Free valet parking service is available.  ? ?2. Diet: Do not eat or drink anything after midnight prior to your procedure except sips of water to take medications. ? ?3. Labs:  None  ? ?4. Medication instructions in preparation for your procedure:  Medications OK - May hold HCTZ this AM. ? ?6. Bring a current list of your medications and current insurance cards. ?7. You MUST have a responsible person to drive you home. ?8. Someone MUST be with you the first 24 hours after you arrive home or your discharge will be delayed. ?9. Please wear clothes that are easy to get on and off and wear slip-on shoes. ? ?Thank you for allowing [place] to care for you! ?  -- [facility] ? ?Follow-Up: ?At [facility], you and your health needs are our priority.  As part of our continuing mission to provide you with exceptional heart care, we have created designated Provider Care Teams.  These Care Teams include your primary Cardiologist (physician) and Advanced Practice Providers (APPs -  Physician Assistants and Nurse Practitioners) who all work together to provide you with the care you need, when you need it. ? ?We recommend signing up for the patient portal called "MyChart".  Sign up information is provided on this After Visit Summary.  MyChart is used to connect with patients for Virtual Visits (Telemedicine).  Patients are able to view lab/test results, encounter  notes, upcoming appointments, etc.  Non-urgent messages can be sent to your provider as well.   ?To learn more about what you can do with MyChart, go to [email or web address].   ? ?Your next appointment:   ?Follow up as previously scheduled with Dr Anne Fu. ? ? ?Thank you for choosing [facility]!! ? ? ? ?

## 2021-09-06 NOTE — Assessment & Plan Note (Signed)
LAD stent placed.  Overall doing well.  No angina.  Continuing with Plavix and Xarelto. ?

## 2021-09-06 NOTE — Assessment & Plan Note (Signed)
She is currently having another episode of atrial fibrillation.  These episodes are quite rare but she is symptomatic with it.  She took an additional metoprolol 25 mg. ? ?I was able to discuss with my EP colleague Dr. Loman Brooklyn, who will see her in consultation for ablation.  She did just have a coronary stent placed so this will be a few months out. ? ?In the meantime, we will go ahead and start amiodarone 200 mg twice a day for 2 weeks and then amiodarone 200 mg a day thereafter. ? ?She has already taken an additional metoprolol today.  Hydrating well. ? ?We will also set her up with a early TEE cardioversion space if necessary.  Hopefully over the next several hours she will auto convert.  The reason for TEE is that she held her Xarelto for cardiac catheterization. ?

## 2021-09-06 NOTE — Patient Instructions (Signed)
Medication Instructions:  ?Your physician recommends that you continue on your current medications as directed. Please refer to the Current Medication list given to you today. ? ?*If you need a refill on your cardiac medications before your next appointment, please call your pharmacy* ? ? ?Lab Work: ?Pre procedure labs 12/17/21:  BMP & CBC ? ?If you have labs (blood work) drawn today and your tests are completely normal, you will receive your results only by: ?MyChart Message (if you have MyChart) OR ?A paper copy in the mail ?If you have any lab test that is abnormal or we need to change your treatment, we will call you to review the results. ? ? ?Testing/Procedures: ?Your physician has requested that you have cardiac CT within 7 days PRIOR to your ablation. Cardiac computed tomography (CT) is a painless test that uses an x-ray machine to take clear, detailed pictures of your heart.  Please follow instruction below located under "other instructions". ?You will get a call from our office to schedule the date for this test. ? ?Your physician has recommended that you have an ablation. Catheter ablation is a medical procedure used to treat some cardiac arrhythmias (irregular heartbeats). During catheter ablation, a long, thin, flexible tube is put into a blood vessel in your groin (upper thigh), or neck. This tube is called an ablation catheter. It is then guided to your heart through the blood vessel. Radio frequency waves destroy small areas of heart tissue where abnormal heartbeats may cause an arrhythmia to start. Please follow instruction letter given to you today. ? ? ?Follow-Up: ?At [facility], you and your health needs are our priority.  As part of our continuing mission to provide you with exceptional heart care, we have created designated Provider Care Teams.  These Care Teams include your primary Cardiologist (physician) and Advanced Practice Providers (APPs -  Physician Assistants and Nurse  Practitioners) who all work together to provide you with the care you need, when you need it. ? ?Your next appointment:   ?1 month(s) after your ablation ? ?The format for your next appointment:   ?In Person ? ?Provider:   ?AFib clinic ? ? ?Thank you for choosing [facility]!! ? ? ?Dory Horn, RN ?[phone] ? ? ? ?Other Instructions ? ?Cardiac Ablation ?Cardiac ablation is a procedure to destroy (ablate) some heart tissue that is sending bad signals. These bad signals cause problems in heart rhythm. ?The heart has many areas that make these signals. If there are problems in these areas, they can make the heart beat in a way that is not normal. Destroying some tissues can help make the heart rhythm normal. ?Tell your doctor about: ?Any allergies you have. ?All medicines you are taking. These include vitamins, herbs, eye drops, creams, and over-the-counter medicines. ?Any problems you or family members have had with medicines that make you fall asleep (anesthetics). ?Any blood disorders you have. ?Any surgeries you have had. ?Any medical conditions you have, such as kidney failure. ?Whether you are pregnant or may be pregnant. ?What are the risks? ?This is a safe procedure. But problems may occur, including: ?Infection. ?Bruising and bleeding. ?Bleeding into the chest. ?Stroke or blood clots. ?Damage to nearby areas of your body. ?Allergies to medicines or dyes. ?The need for a pacemaker if the normal system is damaged. ?Failure of the procedure to treat the problem. ?What happens before the procedure? ?Medicines ?Ask your doctor about: ?Changing or stopping your normal medicines. This is important. ?Taking aspirin and  ibuprofen. Do not take these medicines unless your doctor tells you to take them. ?Taking other medicines, vitamins, herbs, and supplements. ?General instructions ?Follow instructions from your doctor about what you cannot eat or drink. ?Plan to have someone take you home from the hospital or  clinic. ?If you will be going home right after the procedure, plan to have someone with you for 24 hours. ?Ask your doctor what steps will be taken to prevent infection. ?What happens during the procedure? ? ?An IV tube will be put into one of your veins. ?You will be given a medicine to help you relax. ?The skin on your neck or groin will be numbed. ?A cut (incision) will be made in your neck or groin. A needle will be put through your cut and into a large vein. ?A tube (catheter) will be put into the needle. The tube will be moved to your heart. ?Dye may be put through the tube. This helps your doctor see your heart. ?Small devices (electrodes) on the tube will send out signals. ?A type of energy will be used to destroy some heart tissue. ?The tube will be taken out. ?Pressure will be held on your cut. This helps stop bleeding. ?A bandage will be put over your cut. ?The exact procedure may vary among doctors and hospitals. ?What happens after the procedure? ?You will be watched until you leave the hospital or clinic. This includes checking your heart rate, breathing rate, oxygen, and blood pressure. ?Your cut will be watched for bleeding. You will need to lie still for a few hours. ?Do not drive for 24 hours or as long as your doctor tells you. ?Summary ?Cardiac ablation is a procedure to destroy some heart tissue. This is done to treat heart rhythm problems. ?Tell your doctor about any medical conditions you may have. Tell him or her about all medicines you are taking to treat them. ?This is a safe procedure. But problems may occur. These include infection, bruising, bleeding, and damage to nearby areas of your body. ?Follow what your doctor tells you about food and drink. You may also be told to change or stop some of your medicines. ?After the procedure, do not drive for 24 hours or as long as your doctor tells you. ?This information is not intended to replace advice given to you by your health care provider.  Make sure you discuss any questions you have with your health care provider. ?Document Revised: 04/22/2019 Document Reviewed: 04/22/2019 ?Elsevier Patient Education ? 2022 English. ?                             ? ? ? ?

## 2021-09-06 NOTE — Progress Notes (Signed)
? ?Electrophysiology Office Note ? ? ?Date:  09/06/2021  ? ?ID:  Colleen Cole, DOB 1945/11/02, MRN [id number] ? ?PCP:  Caren Macadam, MD  ?Cardiologist:  Marlou Porch ?Primary Electrophysiologist:  Anhthu Perdew Meredith Leeds, MD   ? ?Chief Complaint: AF ?  ?History of Present Illness: ?Colleen Cole is a 76 y.o. female who is being seen today for the evaluation of AF at the request of Caren Macadam, MD. Presenting today for electrophysiology evaluation. ? ?He has a history significant for atrial fibrillation, hypertension, coronary artery disease.  She is now status post LAD stent.  She has intermittent episodes of atrial fibrillation initially treated with metoprolol.  She went into atrial fibrillation today and came in the clinic.  She has been having more frequent episodes of atrial fibrillation causing her to feel quite fatigued, short of breath.  She would prefer a rhythm control strategy. ? ?Today, she denies symptoms of palpitations, chest pain, orthopnea, PND, lower extremity edema, claudication, dizziness, presyncope, syncope, bleeding, or neurologic sequela. The patient is tolerating medications without difficulties.  ? ? ?Past Medical History:  ?Diagnosis Date  ? Hypertension   ? ?Past Surgical History:  ?Procedure Laterality Date  ? BREAST EXCISIONAL BIOPSY Right 1995  ? NEG  ? CORONARY STENT INTERVENTION N/A 08/27/2021  ? Procedure: CORONARY STENT INTERVENTION;  Surgeon: Sherren Mocha, MD;  Location: [place] [facility];  Service: Cardiovascular;  Laterality: N/A;  ? FRACTURE SURGERY  2007  ? fracture in left leg-at patella, tibia and fibula  ? INTRAVASCULAR PRESSURE WIRE/FFR STUDY N/A 08/27/2021  ? Procedure: INTRAVASCULAR PRESSURE WIRE/FFR STUDY;  Surgeon: Sherren Mocha, MD;  Location: [place] [facility];  Service: Cardiovascular;  Laterality: N/A;  ? LEFT HEART CATH AND CORONARY ANGIOGRAPHY N/A 08/27/2021  ? Procedure: LEFT HEART CATH AND CORONARY ANGIOGRAPHY;  Surgeon: Sherren Mocha, MD;  Location: [place] [facility];  Service: Cardiovascular;  Laterality: N/A;  ? TOTAL HIP ARTHROPLASTY Right 08/04/2018  ? Procedure: TOTAL HIP ARTHROPLASTY ANTERIOR APPROACH;  Surgeon: Melrose Nakayama, MD;  Location: [facility];  Service: Orthopedics;  Laterality: Right;  ? ? ? ?Current Outpatient Medications  ?Medication Sig Dispense Refill  ? acetaminophen (TYLENOL) 500 MG tablet Take 1,000 mg by mouth every 6 (six) hours as needed for mild pain or headache.    ? alendronate (FOSAMAX) 70 MG tablet Take 70 mg by mouth every Sunday.    ? amLODipine (NORVASC) 5 MG tablet Take 10 mg by mouth daily.    ? Calcium Carb-Cholecalciferol (CALCIUM 600 + D PO) Take 1 tablet by mouth 2 (two) times daily.    ? clopidogrel (PLAVIX) 75 MG tablet Take 1 tablet (75 mg total) by mouth daily. Take 300 mg on the first day then take 75 mg daily until 3/27 90 tablet 1  ? Docusate Calcium (STOOL SOFTENER PO) Take 2 capsules by mouth daily as needed (constipation).    ? escitalopram (LEXAPRO) 5 MG tablet Take 5 mg by mouth daily.    ? fluticasone (FLONASE) 50 MCG/ACT nasal spray Place 1 spray into both nostrils daily as needed for allergies or rhinitis.    ? hydrALAZINE (APRESOLINE) 25 MG tablet Take 1 tablet (25 mg total) by mouth 2 (two) times daily. (Patient taking differently: Take 25-50 mg by mouth See admin instructions. Take 50 mg in the morning, 25 mg in the afternoon, and 50 mg at night) 180 tablet 3  ? hydrochlorothiazide (HYDRODIURIL) 25 MG tablet Take 25 mg by mouth daily.    ?  levocetirizine (XYZAL) 5 MG tablet Take 5 mg by mouth daily as needed for allergies.    ? losartan (COZAAR) 100 MG tablet Take 100 mg by mouth every evening.    ? metoprolol tartrate (LOPRESSOR) 25 MG tablet TAKE 1 TABLET(25 MG) BY MOUTH TWICE DAILY 180 tablet 3  ? nitroGLYCERIN (NITROSTAT) 0.4 MG SL tablet Place 1 tablet (0.4 mg total) under the tongue every 5 (five) minutes as needed. 25 tablet 2  ? OZEMPIC, 1 MG/DOSE, 4 MG/3ML SOPN Inject 1 mg into the skin every  Thursday.    ? rosuvastatin (CRESTOR) 20 MG tablet Take 1 tablet (20 mg total) by mouth daily. (Patient taking differently: Take 20 mg by mouth every evening.) 90 tablet 3  ? VITAMIN D PO Take 1 capsule by mouth daily.    ? XARELTO 20 MG TABS tablet Take 1 tablet (20 mg total) by mouth daily. 90 tablet 3  ? amiodarone (PACERONE) 200 MG tablet Take (1) tablet twice a day for (2) weeks then decrease to (1) tablet daily 105 tablet 3  ? ?No current facility-administered medications for this visit.  ? ? ?Allergies:   Patient has no known allergies.  ? ?Social History:  The patient  reports that she quit smoking about 23 years ago. Her smoking use included cigarettes. She has never used smokeless tobacco. She reports current alcohol use. She reports that she does not use drugs.  ? ?Family History:  The patient's family history is not on file.  ? ? ?ROS:  Please see the history of present illness.   Otherwise, review of systems is positive for none.   All other systems are reviewed and negative.  ? ? ?PHYSICAL EXAM: ?VS:  BP 120/68   Pulse (!) 101   Ht 5\' 3"  (1.6 m)   Wt 180 lb 9.6 oz (81.9 kg)   SpO2 93%   BMI 31.99 kg/m?  , BMI Body mass index is 31.99 kg/m?. ?GEN: Well nourished, well developed, in no acute distress  ?HEENT: normal  ?Neck: no JVD, carotid bruits, or masses ?Cardiac: RRR; no murmurs, rubs, or gallops,no edema  ?Respiratory:  clear to auscultation bilaterally, normal work of breathing ?GI: soft, nontender, nondistended, + BS ?MS: no deformity or atrophy  ?Skin: warm and dry ?Neuro:  Strength and sensation are intact ?Psych: euthymic mood, full affect ? ?EKG:  EKG is ordered today. ?Personal review of the ekg ordered shows atrial fibrillation ? ?Recent Labs: ?06/12/2021: ALT 14 ?08/14/2021: BUN 12; Creatinine, Ser 0.66; Potassium 4.2; Sodium 136 ?08/27/2021: Hemoglobin 12.5; Platelets 242  ? ? ?Lipid Panel  ?   ?Component Value Date/Time  ? CHOL 151 06/12/2021 0814  ? TRIG 63 06/12/2021 0814  ? HDL 81  06/12/2021 0814  ? CHOLHDL 1.9 06/12/2021 0814  ? [place] 57 06/12/2021 0814  ? ? ? ?Wt Readings from Last 3 Encounters:  ?09/06/21 180 lb 9.6 oz (81.9 kg)  ?09/06/21 180 lb 9.6 oz (81.9 kg)  ?09/04/21 184 lb (83.5 kg)  ?  ? ? ?Other studies Reviewed: ?Additional studies/ records that were reviewed today include: TTE 08/21/21  ?Review of the above records today demonstrates:  ? 1. Left ventricular ejection fraction, by estimation, is 60 to 65%. The  ?left ventricle has normal function. The left ventricle has no regional  ?wall motion abnormalities. Left ventricular diastolic parameters were  ?normal. The average left ventricular  ?global longitudinal strain is 27.3 %. The global longitudinal strain is  ?normal.  ? 2. Right  ventricular systolic function is normal. The right ventricular  ?size is normal. There is normal pulmonary artery systolic pressure. The  ?estimated right ventricular systolic pressure is 0000000 mmHg.  ? 3. Left atrial size was moderately dilated.  ? 4. The mitral valve is normal in structure. Trivial mitral valve  ?regurgitation. No evidence of mitral stenosis.  ? 5. The aortic valve is tricuspid. Aortic valve regurgitation is not  ?visualized. No aortic stenosis is present.  ? 6. The inferior vena cava is normal in size with greater than 50%  ?respiratory variability, suggesting right atrial pressure of 3 mmHg.  ? ?[place] 08/27/21 ?Severe stenosis of the LAD just after the first septal perforator, treated successfully with pressure wire guided PCI using a 2.5 x 20 mm Synergy DES ?Widely patent left main, left circumflex, and RCA with mild nonobstructive plaquing ? ?ASSESSMENT AND PLAN: ? ?1.  Paroxysmal atrial fibrillation: Unfortunately went into atrial fibrillation today.  She feels quite poorly.  She is planning to start amiodarone per her primary cardiologist.  Currently on Xarelto 20 mg daily.  CHA2DS2-VASc of 4.  She would prefer an alternative rhythm control strategy to long-term amiodarone.   Due to that, we Salimata Christenson plan for ablation. ? ?Risk, benefits, and alternatives to EP study and radiofrequency ablation for afib were also discussed in detail today. These risks include but are not limited to stroke, bl

## 2021-09-06 NOTE — Progress Notes (Signed)
?Cardiology Office Note:   ? ?Date:  09/06/2021  ? ?ID:  Colleen Cole, DOB [birth year], MRN [id number] ? ?PCP:  Caren Macadam, MD ?  ?[facility] Providers ?Cardiologist:  Candee Furbish, MD    ? ?Referring MD: Caren Macadam, MD  ? ? ?History of Present Illness:   ? ?Colleen Cole is a 76 y.o. female here for the follow-up of palpitations. ? ?Previously here for the evaluation of worsening shortness of breath.  She has paroxysmal atrial fibrillation on Xarelto, coronary artery calcification/atherosclerosis with calcium score on 01/11/2020 of 711, hyperlipidemia, mild carotid plaque bilaterally. ? ?Back in July 2020 while at a horse show ([place] she had an episode of shortness of breath, presyncope and was taken to the emergency room was found to be in atrial fibrillation with rapid ventricular response.  At the time she was treated with IV metoprolol and auto converted.  Lab work was unremarkable at that time. ? ?In 01/11/2020 she had an echocardiogram which showed normal pump function. ? ?A coronary calcium score was performed on 01/11/2020 with a score of 711 and therefore she was started on atorvastatin.  Her LDL was reduced from 97 down to 55.  She was not having any anginal symptoms at the time of her calcium score. ? ?Her father had CABG in his 59s.  She enjoys golf, very active.  She is retired from [employer] . ? ?Occasional she will have nuisance bleeding from Xarelto. ? ?At her last appointment, she reported increasing frequency of shortness of breath with more minimal physical activity.  For instance, while at a riding lesson, it does not take much at all for her to have to stop and catch her breath.  This was not usual for her. Had COVID 02/2021. ? ?Today: ?Overall she is feeling terrible. When she stands up, she immediately feels like she needs to sit again due to shortness of breath and mild dizziness. ? ?Last night she had walked for 30 minutes. She felt "not exactly  right" since then. She notes she was not in Atrial fibrillation until around 1 PM today, when she immediately called our office. ? ?Her last episode of Afib that she knows of was 2 years ago in July. ? ?She denies any palpitations, chest pain, or peripheral edema. No headaches, syncope, orthopnea, or PND. ? ? ? ? ?Past Medical History:  ?Diagnosis Date  ? Hypertension   ? ? ?Past Surgical History:  ?Procedure Laterality Date  ? BREAST EXCISIONAL BIOPSY Right 1995  ? NEG  ? CORONARY STENT INTERVENTION N/A 08/27/2021  ? Procedure: CORONARY STENT INTERVENTION;  Surgeon: Sherren Mocha, MD;  Location: [facility];  Service: Cardiovascular;  Laterality: N/A;  ? FRACTURE SURGERY  2007  ? fracture in left leg-at patella, tibia and fibula  ? INTRAVASCULAR PRESSURE WIRE/FFR STUDY N/A 08/27/2021  ? Procedure: INTRAVASCULAR PRESSURE WIRE/FFR STUDY;  Surgeon: Sherren Mocha, MD;  Location: [facility];  Service: Cardiovascular;  Laterality: N/A;  ? LEFT HEART CATH AND CORONARY ANGIOGRAPHY N/A 08/27/2021  ? Procedure: LEFT HEART CATH AND CORONARY ANGIOGRAPHY;  Surgeon: Sherren Mocha, MD;  Location: [facility];  Service: Cardiovascular;  Laterality: N/A;  ? TOTAL HIP ARTHROPLASTY Right 08/04/2018  ? Procedure: TOTAL HIP ARTHROPLASTY ANTERIOR APPROACH;  Surgeon: Melrose Nakayama, MD;  Location: [facility];  Service: Orthopedics;  Laterality: Right;  ? ? ?Current Medications: ?Current Meds  ?Medication Sig  ? acetaminophen (TYLENOL) 500 MG tablet Take 1,000 mg  by mouth every 6 (six) hours as needed for mild pain or headache.  ? alendronate (FOSAMAX) 70 MG tablet Take 70 mg by mouth every Sunday.  ? amiodarone (PACERONE) 200 MG tablet Take (1) tablet twice a day for (2) weeks then decrease to (1) tablet daily  ? amLODipine (NORVASC) 5 MG tablet Take 10 mg by mouth daily.  ? Calcium Carb-Cholecalciferol (CALCIUM 600 + D PO) Take 1 tablet by mouth 2 (two) times daily.  ? clopidogrel (PLAVIX) 75 MG tablet Take 1 tablet  (75 mg total) by mouth daily. Take 300 mg on the first day then take 75 mg daily until 3/27  ? Docusate Calcium (STOOL SOFTENER PO) Take 2 capsules by mouth daily as needed (constipation).  ? escitalopram (LEXAPRO) 5 MG tablet Take 5 mg by mouth daily.  ? fluticasone (FLONASE) 50 MCG/ACT nasal spray Place 1 spray into both nostrils daily as needed for allergies or rhinitis.  ? hydrALAZINE (APRESOLINE) 25 MG tablet Take 1 tablet (25 mg total) by mouth 2 (two) times daily. (Patient taking differently: Take 25-50 mg by mouth See admin instructions. Take 50 mg in the morning, 25 mg in the afternoon, and 50 mg at night)  ? hydrochlorothiazide (HYDRODIURIL) 25 MG tablet Take 25 mg by mouth daily.  ? levocetirizine (XYZAL) 5 MG tablet Take 5 mg by mouth daily as needed for allergies.  ? losartan (COZAAR) 100 MG tablet Take 100 mg by mouth every evening.  ? metoprolol tartrate (LOPRESSOR) 25 MG tablet TAKE 1 TABLET(25 MG) BY MOUTH TWICE DAILY  ? nitroGLYCERIN (NITROSTAT) 0.4 MG SL tablet Place 1 tablet (0.4 mg total) under the tongue every 5 (five) minutes as needed.  ? OZEMPIC, 1 MG/DOSE, 4 MG/3ML SOPN Inject 1 mg into the skin every Thursday.  ? rosuvastatin (CRESTOR) 20 MG tablet Take 1 tablet (20 mg total) by mouth daily. (Patient taking differently: Take 20 mg by mouth every evening.)  ? VITAMIN D PO Take 1 capsule by mouth daily.  ? XARELTO 20 MG TABS tablet Take 1 tablet (20 mg total) by mouth daily.  ?  ? ?Allergies:   Patient has no known allergies.  ? ?Social History  ? ?Socioeconomic History  ? Marital status: Single  ?  Spouse name: Not on file  ? Number of children: Not on file  ? Years of education: Not on file  ? Highest education level: Not on file  ?Occupational History  ? Not on file  ?Tobacco Use  ? Smoking status: Former  ?  Types: Cigarettes  ?  Quit date: 07/13/1998  ?  Years since quitting: 23.1  ? Smokeless tobacco: Never  ?Vaping Use  ? Vaping Use: Never used  ?Substance and Sexual Activity  ?  Alcohol use: Yes  ?  Comment: occassionally  ? Drug use: Never  ? Sexual activity: Not on file  ?Other Topics Concern  ? Not on file  ?Social History Narrative  ? Not on file  ? ?Social Determinants of Health  ? ?Financial Resource Strain: Not on file  ?Food Insecurity: Not on file  ?Transportation Needs: Not on file  ?Physical Activity: Not on file  ?Stress: Not on file  ?Social Connections: Not on file  ?  ? ?Family History: ?The patient's family history is negative for Breast cancer. ? ?ROS:   ?Please see the history of present illness.    ? All other systems reviewed and are negative. ? ?EKGs/Labs/Other Studies Reviewed:   ? ?The following studies  were reviewed today: ?Coronary calcium score 2021 as above personally reviewed and interpreted ? ? ?Echocardiogram 01/11/2020: ? ? ? 1. Left ventricular ejection fraction, by estimation, is 65 to 70%. The  ?left ventricle has normal function. The left ventricle has no regional  ?wall motion abnormalities. Left ventricular diastolic parameters were  ?normal.  ? 2. Right ventricular systolic function is normal. The right ventricular  ?size is normal. There is normal pulmonary artery systolic pressure. The  ?estimated right ventricular systolic pressure is [id number] mmHg.  ? 3. The mitral valve is normal in structure. Trivial mitral valve  ?regurgitation. No evidence of mitral stenosis.  ? 4. The aortic valve is normal in structure. Aortic valve regurgitation is  ?not visualized. No aortic stenosis is present.  ? 5. The inferior vena cava is normal in size with greater than 50%  ?respiratory variability, suggesting right atrial pressure of 3 mmHg.  ? ?EKG:  EKG is personally reviewed. ?09/06/2021: Atrial fibrillation. Rate 101 bpm. ?08/13/2021: sinus rhythm 62 no other abnormalities QTc 401 ? ?Recent Labs: ?06/12/2021: ALT 14 ?08/14/2021: BUN 12; Creatinine, Ser 0.66; Potassium 4.2; Sodium 136 ?08/27/2021: Hemoglobin 12.5; Platelets 242  ? ?Recent Lipid Panel ?   ?Component Value  Date/Time  ? CHOL 151 06/12/2021 0814  ? TRIG 63 06/12/2021 0814  ? HDL 81 06/12/2021 0814  ? CHOLHDL 1.9 06/12/2021 0814  ? [place] 57 06/12/2021 0814  ? ?    ? ? ? ?Cardiac Rehabilitation Eligibility Assessmen

## 2021-09-10 ENCOUNTER — Telehealth: Payer: Self-pay | Admitting: Cardiology

## 2021-09-10 NOTE — Telephone Encounter (Signed)
? ?  She is in normal rhythm (since Friday 09/07/21 morning according to her Apple watch).  ? ?On Amiodarone 200mg  BID ?Metoprolol 25 BID ? ?Dr. plans for ablation in August ? ?She feels well, HR 65. Going to [place] to watch horse show.  ? ?I called [facility]) and cancelled TEE/CV for Wed.  ? ?Carollee Herter, MD ? ?

## 2021-09-12 ENCOUNTER — Encounter (HOSPITAL_COMMUNITY): Admission: RE | Payer: Self-pay | Source: Home / Self Care

## 2021-09-12 ENCOUNTER — Ambulatory Visit (HOSPITAL_COMMUNITY): Admission: RE | Admit: 2021-09-12 | Payer: Medicare Other | Source: Home / Self Care | Admitting: Cardiology

## 2021-09-12 SURGERY — ECHOCARDIOGRAM, TRANSESOPHAGEAL
Anesthesia: General

## 2021-09-18 ENCOUNTER — Encounter (HOSPITAL_COMMUNITY)
Admission: RE | Admit: 2021-09-18 | Discharge: 2021-09-18 | Disposition: A | Discharge status: Home / Self Care | Payer: Medicare Other | Source: Ambulatory Visit | Attending: Cardiology | Admitting: Cardiology

## 2021-09-18 ENCOUNTER — Other Ambulatory Visit: Payer: Self-pay | Admitting: Cardiology

## 2021-09-18 ENCOUNTER — Encounter (HOSPITAL_COMMUNITY): Payer: Self-pay

## 2021-09-18 VITALS — BP 108/68 | HR 64 | Ht 63.0 in | Wt 185.8 lb

## 2021-09-18 DIAGNOSIS — Z48812 Encounter for surgical aftercare following surgery on the circulatory system: Secondary | ICD-10-CM | POA: Diagnosis not present

## 2021-09-18 DIAGNOSIS — Z7985 Long-term (current) use of injectable non-insulin antidiabetic drugs: Secondary | ICD-10-CM | POA: Insufficient documentation

## 2021-09-18 DIAGNOSIS — Z955 Presence of coronary angioplasty implant and graft: Secondary | ICD-10-CM | POA: Diagnosis present

## 2021-09-18 DIAGNOSIS — E119 Type 2 diabetes mellitus without complications: Secondary | ICD-10-CM | POA: Insufficient documentation

## 2021-09-18 LAB — GLUCOSE, CAPILLARY: Glucose-Capillary: 108 mg/dL — ABNORMAL HIGH (ref 70–99)

## 2021-09-18 MED ORDER — CLOPIDOGREL BISULFATE 75 MG PO TABS: 75.0000 mg | ORAL_TABLET | Freq: Every day | ORAL | 1 refills | Status: DC

## 2021-09-18 NOTE — Telephone Encounter (Signed)
Pt is requesting a refill on clopidogrel. This medication states that pt is supposed to take until 3/27. Does pt still supposed to be taking this medication? Please address ?

## 2021-09-18 NOTE — Telephone Encounter (Signed)
Per 08/27/21 discharge note- ? ?Recommendations: Continue clopidogrel x6 months, resume rivaroxaban tomorrow.  Would avoid aspirin in the setting of chronic oral anticoagulation plus clopidogrel in order to avoid excessive bleeding risk ? ?Last office note from 09/06/21- ?Coronary artery disease involving native coronary artery of native heart with angina pectoris ([place]) ?LAD stent placed.  Overall doing well.  No angina.  Continuing with Plavix and Xarelto ? ?Refill sent to pharmacy ?

## 2021-09-18 NOTE — Progress Notes (Signed)
Cardiac Individual Treatment Plan ? ?Patient Details  ?Name: Colleen Cole ?MRN: [id number] ?Date of Birth: [birth year] ?Referring Provider:   ?Flowsheet Row CARDIAC REHAB PHASE II ORIENTATION from 09/18/2021 in [place] [facility]  ?Referring Provider Dr. Excell Seltzer  ? ?  ? ? ?Initial Encounter Date:  ?Flowsheet Row CARDIAC REHAB PHASE II ORIENTATION from 09/18/2021 in [place] [facility]  ?Date 09/18/21  ? ?  ? ? ?Visit Diagnosis: S/P coronary artery stent placement ? ?Patient's Home Medications on Admission: ? ?Current Outpatient Medications:  ?  acetaminophen (TYLENOL) 500 MG tablet, Take 1,000 mg by mouth every 6 (six) hours as needed for mild pain or headache., Disp: , Rfl:  ?  alendronate (FOSAMAX) 70 MG tablet, Take 70 mg by mouth every Sunday., Disp: , Rfl:  ?  amiodarone (PACERONE) 200 MG tablet, Take (1) tablet twice a day for (2) weeks then decrease to (1) tablet daily, Disp: 105 tablet, Rfl: 3 ?  amLODipine (NORVASC) 5 MG tablet, Take 10 mg by mouth daily., Disp: , Rfl:  ?  Calcium Carb-Cholecalciferol (CALCIUM 600 + D PO), Take 1 tablet by mouth 2 (two) times daily., Disp: , Rfl:  ?  cholecalciferol (VITAMIN D3) 25 MCG (1000 UNIT) tablet, Take 1,000 Units by mouth daily., Disp: , Rfl:  ?  clopidogrel (PLAVIX) 75 MG tablet, Take 1 tablet (75 mg total) by mouth daily. Take 300 mg on the first day then take 75 mg daily until 3/27, Disp: 90 tablet, Rfl: 1 ?  Docusate Calcium (STOOL SOFTENER PO), Take 2 capsules by mouth at bedtime., Disp: , Rfl:  ?  escitalopram (LEXAPRO) 5 MG tablet, Take 5 mg by mouth daily., Disp: , Rfl:  ?  fluticasone (FLONASE) 50 MCG/ACT nasal spray, Place 1 spray into both nostrils daily as needed for allergies or rhinitis., Disp: , Rfl:  ?  hydrALAZINE (APRESOLINE) 25 MG tablet, Take 1 tablet (25 mg total) by mouth 2 (two) times daily. (Patient taking differently: Take 25-50 mg by mouth See admin instructions. Take 50 mg in the morning, 25 mg in the  afternoon, and 50 mg at night), Disp: 180 tablet, Rfl: 3 ?  hydrochlorothiazide (HYDRODIURIL) 25 MG tablet, Take 25 mg by mouth daily., Disp: , Rfl:  ?  levocetirizine (XYZAL) 5 MG tablet, Take 5 mg by mouth daily as needed for allergies., Disp: , Rfl:  ?  losartan (COZAAR) 100 MG tablet, Take 100 mg by mouth every evening., Disp: , Rfl:  ?  metoprolol tartrate (LOPRESSOR) 25 MG tablet, TAKE 1 TABLET(25 MG) BY MOUTH TWICE DAILY, Disp: 180 tablet, Rfl: 3 ?  nitroGLYCERIN (NITROSTAT) 0.4 MG SL tablet, Place 1 tablet (0.4 mg total) under the tongue every 5 (five) minutes as needed., Disp: 25 tablet, Rfl: 2 ?  OZEMPIC, 1 MG/DOSE, 4 MG/3ML SOPN, Inject 1 mg into the skin every Thursday., Disp: , Rfl:  ?  rosuvastatin (CRESTOR) 20 MG tablet, Take 1 tablet (20 mg total) by mouth daily. (Patient taking differently: Take 20 mg by mouth every evening.), Disp: 90 tablet, Rfl: 3 ?  senna (SENOKOT) 8.6 MG TABS tablet, Take 2 tablets by mouth at bedtime., Disp: , Rfl:  ?  XARELTO 20 MG TABS tablet, Take 1 tablet (20 mg total) by mouth daily., Disp: 90 tablet, Rfl: 3 ? ?Past Medical History: ?Past Medical History:  ?Diagnosis Date  ? Hypertension   ? ? ?Tobacco Use: ?Social History  ? ?Tobacco Use  ?Smoking Status Former  ? Types: Cigarettes  ?  Quit date: 07/13/1998  ? Years since quitting: 23.2  ?Smokeless Tobacco Never  ? ? ?Labs: ?Review Flowsheet   ? ?  ?  Latest Ref Rng & Units 01/17/2020 05/17/2020 06/12/2021  ?Labs for ITP Cardiac and Pulmonary Rehab  ?Cholestrol 100 - 199 mg/dL 814   481   856    ?LDL (calc) 0 - 99 mg/dL 97   55   57    ?HDL-C >39 mg/dL 66   67   81    ?Trlycerides 0 - 149 mg/dL 83   69   63    ?  ? ? Multiple values from one day are sorted in reverse-chronological order  ?  ?  ? ? ?Capillary Blood Glucose: ?Lab Results  ?Component Value Date  ? GLUCAP 108 (H) 09/18/2021  ? ? ? ?Exercise Target Goals: ?Exercise Program Goal: ?Individual exercise prescription set using results from initial 6 min walk test and  THRR while considering  patient?s activity barriers and safety.  ? ?Exercise Prescription Goal: ?Starting with aerobic activity 30 plus minutes a day, 3 days per week for initial exercise prescription. Provide home exercise prescription and guidelines that participant acknowledges understanding prior to discharge. ? ?Activity Barriers & Risk Stratification: ? Activity Barriers & Cardiac Risk Stratification - 09/18/21 0837   ? ?  ? Activity Barriers & Cardiac Risk Stratification  ? Activity Barriers Right Hip Replacement;Arthritis;Joint Problems   ? Cardiac Risk Stratification High   ? ?  ?  ? ?  ? ? ?6 Minute Walk: ? 6 Minute Walk   ? ? Row Name 09/18/21 0934  ?  ?  ?  ? 6 Minute Walk  ? Phase Initial    ? Distance 1500 feet    ? Walk Time 6 minutes    ? # of Rest Breaks 0    ? MPH 2.84    ? METS 2.5    ? RPE 12    ? VO2 Peak 8.87    ? Symptoms No    ? Resting HR 64 bpm    ? Resting BP 108/68    ? Resting Oxygen Saturation  95 %    ? Exercise Oxygen Saturation  during 6 min walk 96 %    ? Max Ex. HR 77 bpm    ? Max Ex. BP 136/60    ? 2 Minute Post BP 118/60    ? ?  ?  ? ?  ? ? ?Oxygen Initial Assessment: ? ? ?Oxygen Re-Evaluation: ? ? ?Oxygen Discharge (Final Oxygen Re-Evaluation): ? ? ?Initial Exercise Prescription: ? Initial Exercise Prescription - 09/18/21 0900   ? ?  ? Date of Initial Exercise RX and Referring Provider  ? Date 09/18/21   ? Referring Provider Dr. Excell Seltzer   ? Expected Discharge Date 12/14/21   ?  ? Treadmill  ? MPH 2.5   ? Grade 0   ? Minutes 17   ?  ? NuStep  ? Level 1   ? SPM 60   ? Minutes 22   ?  ? Prescription Details  ? Frequency (times per week) 3   ? Duration Progress to 30 minutes of continuous aerobic without signs/symptoms of physical distress   ?  ? Intensity  ? THRR 40-80% of Max Heartrate 59-118   ? Ratings of Perceived Exertion 11-13   ?  ? Resistance Training  ? Training Prescription Yes   ? Weight 3   ? Reps 10-15   ? ?  ?  ? ?  ? ? ?  Perform Capillary Blood Glucose checks as  needed. ? ?Exercise Prescription Changes: ? ? ?Exercise Comments: ? ? ?Exercise Goals and Review: ? ? Exercise Goals   ? ? Row Name 09/18/21 0936  ?  ?  ?  ?  ?  ? Exercise Goals  ? Increase Physical Activity Yes      ? Intervention Provide advice, education, support and counseling about physical activity/exercise needs.;Develop an individualized exercise prescription for aerobic and resistive training based on initial evaluation findings, risk stratification, comorbidities and participant's personal goals.      ? Expected Outcomes Short Term: Attend rehab on a regular basis to increase amount of physical activity.;Long Term: Add in home exercise to make exercise part of routine and to increase amount of physical activity.;Long Term: Exercising regularly at least 3-5 days a week.      ? Increase Strength and Stamina Yes      ? Intervention Provide advice, education, support and counseling about physical activity/exercise needs.;Develop an individualized exercise prescription for aerobic and resistive training based on initial evaluation findings, risk stratification, comorbidities and participant's personal goals.      ? Expected Outcomes Short Term: Perform resistance training exercises routinely during rehab and add in resistance training at home;Short Term: Increase workloads from initial exercise prescription for resistance, speed, and METs.;Long Term: Improve cardiorespiratory fitness, muscular endurance and strength as measured by increased METs and functional capacity (6MWT)      ? Able to understand and use rate of perceived exertion (RPE) scale Yes      ? Intervention Provide education and explanation on how to use RPE scale      ? Expected Outcomes Short Term: Able to use RPE daily in rehab to express subjective intensity level;Long Term:  Able to use RPE to guide intensity level when exercising independently      ? Knowledge and understanding of Target Heart Rate Range (THRR) Yes      ? Intervention  Provide education and explanation of THRR including how the numbers were predicted and where they are located for reference      ? Expected Outcomes Short Term: Able to state/look up THRR;Long Term: Able to use THRR to g

## 2021-09-18 NOTE — Telephone Encounter (Signed)
*  STAT* If patient is at the pharmacy, call can be transferred to refill team.   1. Which medications need to be refilled? (please list name of each medication and dose if known)   clopidogrel (PLAVIX) 75 MG tablet    2. Which pharmacy/location (including street and city if local pharmacy) is medication to be sent to?  [id number] - [place], [place] - [address] AT [address]    3. Do they need a 30 day or 90 day supply? 90  

## 2021-09-19 ENCOUNTER — Other Ambulatory Visit (HOSPITAL_COMMUNITY): Payer: Self-pay

## 2021-09-24 ENCOUNTER — Encounter (HOSPITAL_COMMUNITY)
Admission: RE | Admit: 2021-09-24 | Discharge: 2021-09-24 | Disposition: A | Discharge status: Home / Self Care | Payer: Medicare Other | Source: Ambulatory Visit | Attending: Cardiology | Admitting: Cardiology

## 2021-09-24 VITALS — Wt 186.3 lb

## 2021-09-24 DIAGNOSIS — Z955 Presence of coronary angioplasty implant and graft: Secondary | ICD-10-CM

## 2021-09-24 DIAGNOSIS — Z48812 Encounter for surgical aftercare following surgery on the circulatory system: Secondary | ICD-10-CM | POA: Diagnosis not present

## 2021-09-24 LAB — GLUCOSE, CAPILLARY: Glucose-Capillary: 103 mg/dL — ABNORMAL HIGH (ref 70–99)

## 2021-09-24 NOTE — Progress Notes (Signed)
Daily Session Note ? ?Patient Details  ?Name: Colleen Cole ?MRN: [id number] ?Date of Birth: 01-May-1946 ?Referring Provider:   ?Flowsheet Row CARDIAC REHAB PHASE II ORIENTATION from 09/18/2021 in [place]  ?Referring Provider Dr. Burt Knack  ? ?  ? ? ?Encounter Date: 09/24/2021 ? ?Check In: ? Session Check In - 09/24/21 0930   ? ?  ? Check-In  ? Supervising physician immediately available to respond to emergencies [facility] MD immediately available   ? Physician(s) Dr.Chandrasekher   ? Location [facility]   ? Staff Present Redge Gainer, BS, Exercise Physiologist;Dalton Fletcher, MS, ACSM-CEP, Exercise Physiologist   ? Virtual Visit No   ? Medication changes reported     No   ? Fall or balance concerns reported    No   ? Tobacco Cessation No Change   ? Warm-up and Cool-down Performed as group-led instruction   ? Resistance Training Performed Yes   ? VAD Patient? No   ? PAD/SET Patient? No   ?  ? Pain Assessment  ? Currently in Pain? No/denies   ? Multiple Pain Sites No   ? ?  ?  ? ?  ? ? ?Capillary Blood Glucose: ?Results for orders placed or performed during the hospital encounter of 09/24/21 (from the past 24 hour(s))  ?Glucose, capillary     Status: Abnormal  ? Collection Time: 09/24/21  9:20 AM  ?Result Value Ref Range  ? Glucose-Capillary 103 (H) 70 - 99 mg/dL  ? ? ? ? ?Social History  ? ?Tobacco Use  ?Smoking Status Former  ? Types: Cigarettes  ? Quit date: 07/13/1998  ? Years since quitting: 23.2  ?Smokeless Tobacco Never  ? ? ?Goals Met:  ?Independence with exercise equipment ?Exercise tolerated well ?No report of concerns or symptoms today ?Strength training completed today ? ?Goals Unmet:  ?Not Applicable ? ?Comments: check out 1030 ? ? ?Dr. Carlyle Dolly is Medical Director for [place] ?

## 2021-09-26 ENCOUNTER — Encounter (HOSPITAL_COMMUNITY)
Admission: RE | Admit: 2021-09-26 | Discharge: 2021-09-26 | Disposition: A | Discharge status: Home / Self Care | Payer: Medicare Other | Source: Ambulatory Visit | Attending: Cardiology | Admitting: Cardiology

## 2021-09-26 DIAGNOSIS — Z955 Presence of coronary angioplasty implant and graft: Secondary | ICD-10-CM

## 2021-09-26 DIAGNOSIS — Z48812 Encounter for surgical aftercare following surgery on the circulatory system: Secondary | ICD-10-CM | POA: Diagnosis not present

## 2021-09-26 NOTE — Progress Notes (Signed)
Daily Session Note ? ?Patient Details  ?Name: Mykhia Danish ?MRN: [id number] ?Date of Birth: 07-Jan-1946 ?Referring Provider:   ?Flowsheet Row CARDIAC REHAB PHASE II ORIENTATION from 09/18/2021 in [place]  ?Referring Provider Dr. Burt Knack  ? ?  ? ? ?Encounter Date: 09/26/2021 ? ?Check In: ? Session Check In - 09/26/21 0930   ? ?  ? Check-In  ? Supervising physician immediately available to respond to emergencies [facility] MD immediately available   ? Physician(s) Dr. Domenic Polite   ? Location [facility]   ? Staff Present Redge Gainer, BS, Exercise Physiologist;Ennis Delpozo, MS, ACSM-CEP, Exercise Physiologist   ? Virtual Visit No   ? Medication changes reported     No   ? Fall or balance concerns reported    No   ? Tobacco Cessation No Change   ? Warm-up and Cool-down Performed as group-led instruction   ? Resistance Training Performed Yes   ? VAD Patient? No   ? PAD/SET Patient? No   ?  ? Pain Assessment  ? Currently in Pain? No/denies   ? Multiple Pain Sites No   ? ?  ?  ? ?  ? ? ?Capillary Blood Glucose: ?No results found for this or any previous visit (from the past 24 hour(s)). ? ? ? ?Social History  ? ?Tobacco Use  ?Smoking Status Former  ? Types: Cigarettes  ? Quit date: 07/13/1998  ? Years since quitting: 23.2  ?Smokeless Tobacco Never  ? ? ?Goals Met:  ?Independence with exercise equipment ?Exercise tolerated well ?No report of concerns or symptoms today ?Strength training completed today ? ?Goals Unmet:  ?Not Applicable ? ?Comments: checkout time is 1030 ? ? ?Dr. Carlyle Dolly is Medical Director for [place] ?

## 2021-09-26 NOTE — Progress Notes (Signed)
Cardiac Individual Treatment Plan ? ?Patient Details  ?Name: Colleen Cole ?MRN: [id number] ?Date of Birth: [birth year] ?Referring Provider:   ?Flowsheet Row CARDIAC REHAB PHASE II ORIENTATION from 09/18/2021 in [place] CARDIAC RE[facility]  ?Referring Provider Dr. Excell Seltzer  ? ?  ? ? ?Initial Encounter Date:  ?Flowsheet Row CARDIAC REHAB PHASE II ORIENTATION from 09/18/2021 in [place] C[facility]IAC [facility]  ?Date 09/18/21  ? ?  ? ? ?Visit Diagnosis: S/P coronary artery stent placement ? ?Patient's Home Medications on Admission: ? ?Current Outpatient Medications:  ?  acetaminophen (TYLENOL) 500 MG tablet, Take 1,000 mg by mouth every 6 (six) hours as needed for mild pain or headache., Disp: , Rfl:  ?  alendronate (FOSAMAX) 70 MG tablet, Take 70 mg by mouth every Sunday., Disp: , Rfl:  ?  amiodarone (PACERONE) 200 MG tablet, Take (1) tablet twice a day for (2) weeks then decrease to (1) tablet daily, Disp: 105 tablet, Rfl: 3 ?  amLODipine (NORVASC) 5 MG tablet, Take 10 mg by mouth daily., Disp: , Rfl:  ?  Calcium Carb-Cholecalciferol (CALCIUM 600 + D PO), Take 1 tablet by mouth 2 (two) times daily., Disp: , Rfl:  ?  cholecalciferol (VITAMIN D3) 25 MCG (1000 UNIT) tablet, Take 1,000 Units by mouth daily., Disp: , Rfl:  ?  Docusate Calcium (STOOL SOFTENER PO), Take 2 capsules by mouth at bedtime., Disp: , Rfl:  ?  escitalopram (LEXAPRO) 5 MG tablet, Take 5 mg by mouth daily., Disp: , Rfl:  ?  fluticasone (FLONASE) 50 MCG/ACT nasal spray, Place 1 spray into both nostrils daily as needed for allergies or rhinitis., Disp: , Rfl:  ?  hydrALAZINE (APRESOLINE) 25 MG tablet, Take 1 tablet (25 mg total) by mouth 2 (two) times daily. (Patient taking differently: Take 25-50 mg by mouth See admin instructions. Take 50 mg in the morning, 25 mg in the afternoon, and 50 mg at night), Disp: 180 tablet, Rfl: 3 ?  hydrochlorothiazide (HYDRODIURIL) 25 MG tablet, Take 25 mg by mouth daily., Disp: , Rfl:  ?  levocetirizine (XYZAL) 5  MG tablet, Take 5 mg by mouth daily as needed for allergies., Disp: , Rfl:  ?  losartan (COZAAR) 100 MG tablet, Take 100 mg by mouth every evening., Disp: , Rfl:  ?  metoprolol tartrate (LOPRESSOR) 25 MG tablet, TAKE 1 TABLET(25 MG) BY MOUTH TWICE DAILY, Disp: 180 tablet, Rfl: 3 ?  nitroGLYCERIN (NITROSTAT) 0.4 MG SL tablet, Place 1 tablet (0.4 mg total) under the tongue every 5 (five) minutes as needed., Disp: 25 tablet, Rfl: 2 ?  OZEMPIC, 1 MG/DOSE, 4 MG/3ML SOPN, Inject 1 mg into the skin every Thursday., Disp: , Rfl:  ?  rosuvastatin (CRESTOR) 20 MG tablet, Take 1 tablet (20 mg total) by mouth daily. (Patient taking differently: Take 20 mg by mouth every evening.), Disp: 90 tablet, Rfl: 3 ?  senna (SENOKOT) 8.6 MG TABS tablet, Take 2 tablets by mouth at bedtime., Disp: , Rfl:  ?  XARELTO 20 MG TABS tablet, Take 1 tablet (20 mg total) by mouth daily., Disp: 90 tablet, Rfl: 3 ?  clopidogrel (PLAVIX) 75 MG tablet, Take 1 tablet (75 mg total) by mouth daily., Disp: 90 tablet, Rfl: 1 ? ?Past Medical History: ?Past Medical History:  ?Diagnosis Date  ? Hypertension   ? ? ?Tobacco Use: ?Social History  ? ?Tobacco Use  ?Smoking Status Former  ? Types: Cigarettes  ? Quit date: 07/13/1998  ? Years since quitting: 23.2  ?Smokeless Tobacco Never  ? ? ?  Labs: ?Review Flowsheet   ? ?  ?  Latest Ref Rng & Units 01/17/2020 05/17/2020 06/12/2021  ?Labs for ITP Cardiac and Pulmonary Rehab  ?Cholestrol 100 - 199 mg/dL 469   629   528    ?LDL (calc) 0 - 99 mg/dL 97   55   57    ?HDL-C >39 mg/dL 66   67   81    ?Trlycerides 0 - 149 mg/dL 83   69   63    ?  ? ? Multiple values from one day are sorted in reverse-chronological order  ?  ?  ? ? ?Capillary Blood Glucose: ?Lab Results  ?Component Value Date  ? GLUCAP 103 (H) 09/24/2021  ? GLUCAP 108 (H) 09/18/2021  ? ? ? ?Exercise Target Goals: ?Exercise Program Goal: ?Individual exercise prescription set using results from initial 6 min walk test and THRR while considering  patient?s activity  barriers and safety.  ? ?Exercise Prescription Goal: ?Starting with aerobic activity 30 plus minutes a day, 3 days per week for initial exercise prescription. Provide home exercise prescription and guidelines that participant acknowledges understanding prior to discharge. ? ?Activity Barriers & Risk Stratification: ? Activity Barriers & Cardiac Risk Stratification - 09/18/21 0837   ? ?  ? Activity Barriers & Cardiac Risk Stratification  ? Activity Barriers Right Hip Replacement;Arthritis;Joint Problems   ? Cardiac Risk Stratification High   ? ?  ?  ? ?  ? ? ?6 Minute Walk: ? 6 Minute Walk   ? ? Row Name 09/18/21 0934  ?  ?  ?  ? 6 Minute Walk  ? Phase Initial    ? Distance 1500 feet    ? Walk Time 6 minutes    ? # of Rest Breaks 0    ? MPH 2.84    ? METS 2.5    ? RPE 12    ? VO2 Peak 8.87    ? Symptoms No    ? Resting HR 64 bpm    ? Resting BP 108/68    ? Resting Oxygen Saturation  95 %    ? Exercise Oxygen Saturation  during 6 min walk 96 %    ? Max Ex. HR 77 bpm    ? Max Ex. BP 136/60    ? 2 Minute Post BP 118/60    ? ?  ?  ? ?  ? ? ?Oxygen Initial Assessment: ? ? ?Oxygen Re-Evaluation: ? ? ?Oxygen Discharge (Final Oxygen Re-Evaluation): ? ? ?Initial Exercise Prescription: ? Initial Exercise Prescription - 09/18/21 0900   ? ?  ? Date of Initial Exercise RX and Referring Provider  ? Date 09/18/21   ? Referring Provider Dr. Excell Seltzer   ? Expected Discharge Date 12/14/21   ?  ? Treadmill  ? MPH 2.5   ? Grade 0   ? Minutes 17   ?  ? NuStep  ? Level 1   ? SPM 60   ? Minutes 22   ?  ? Prescription Details  ? Frequency (times per week) 3   ? Duration Progress to 30 minutes of continuous aerobic without signs/symptoms of physical distress   ?  ? Intensity  ? THRR 40-80% of Max Heartrate 59-118   ? Ratings of Perceived Exertion 11-13   ?  ? Resistance Training  ? Training Prescription Yes   ? Weight 3   ? Reps 10-15   ? ?  ?  ? ?  ? ? ?Perform Capillary  Blood Glucose checks as needed. ? ?Exercise Prescription Changes: ? ?  Exercise Prescription Changes   ? ? Row Name 09/24/21 1000  ?  ?  ?  ?  ?  ? Response to Exercise  ? Blood Pressure (Admit) 128/60      ? Blood Pressure (Exercise) 128/58      ? Blood Pressure (Exit) 116/60      ? Heart Rate (Admit) 67 bpm      ? Heart Rate (Exercise) 79 bpm      ? Heart Rate (Exit) 71 bpm      ? Rating of Perceived Exertion (Exercise) 11      ? Duration Continue with 30 min of aerobic exercise without signs/symptoms of physical distress.      ? Intensity THRR unchanged      ?  ? Progression  ? Progression Continue to progress workloads to maintain intensity without signs/symptoms of physical distress.      ?  ? Resistance Training  ? Training Prescription Yes      ? Weight 3      ? Reps 10-15      ? Time 10 Minutes      ?  ? Treadmill  ? MPH 2.5      ? Grade 0      ? Minutes 17      ? METs 2.91      ?  ? NuStep  ? Level 1      ? SPM 114      ? Minutes 22      ? METs 3.29      ? ?  ?  ? ?  ? ? ?Exercise Comments: ? ? ?Exercise Goals and Review: ? ? Exercise Goals   ? ? Row Name 09/18/21 0936 09/24/21 1046  ?  ?  ?  ?  ? Exercise Goals  ? Increase Physical Activity Yes Yes     ? Intervention Provide advice, education, support and counseling about physical activity/exercise needs.;Develop an individualized exercise prescription for aerobic and resistive training based on initial evaluation findings, risk stratification, comorbidities and participant's personal goals. Provide advice, education, support and counseling about physical activity/exercise needs.;Develop an individualized exercise prescription for aerobic and resistive training based on initial evaluation findings, risk stratification, comorbidities and participant's personal goals.     ? Expected Outcomes Short Term: Attend rehab on a regular basis to increase amount of physical activity.;Long Term: Add in home exercise to make exercise part of routine and to increase amount of physical activity.;Long Term: Exercising regularly at least 3-5  days a week. Short Term: Attend rehab on a regular basis to increase amount of physical activity.;Long Term: Add in home exercise to make exercise part of routine and to increase amount of physical acti

## 2021-09-28 ENCOUNTER — Encounter (HOSPITAL_COMMUNITY)
Admission: RE | Admit: 2021-09-28 | Discharge: 2021-09-28 | Disposition: A | Discharge status: Home / Self Care | Payer: Medicare Other | Source: Ambulatory Visit | Attending: Cardiology | Admitting: Cardiology

## 2021-09-28 ENCOUNTER — Ambulatory Visit: Payer: Medicare Other | Admitting: Cardiology

## 2021-09-28 ENCOUNTER — Other Ambulatory Visit: Payer: Self-pay | Admitting: Family Medicine

## 2021-09-28 DIAGNOSIS — M810 Age-related osteoporosis without current pathological fracture: Secondary | ICD-10-CM

## 2021-09-28 DIAGNOSIS — Z48812 Encounter for surgical aftercare following surgery on the circulatory system: Secondary | ICD-10-CM | POA: Diagnosis not present

## 2021-09-28 DIAGNOSIS — Z955 Presence of coronary angioplasty implant and graft: Secondary | ICD-10-CM

## 2021-09-28 DIAGNOSIS — Z1231 Encounter for screening mammogram for malignant neoplasm of breast: Secondary | ICD-10-CM

## 2021-09-28 NOTE — Progress Notes (Signed)
Daily Session Note ? ?Patient Details  ?Name: Colleen Cole ?MRN: [id number] ?Date of Birth: 07-03-1945 ?Referring Provider:   ?Flowsheet Row CARDIAC REHAB PHASE II ORIENTATION from 09/18/2021 in [place]  ?Referring Provider Dr. Burt Knack  ? ?  ? ? ?Encounter Date: 09/28/2021 ? ?Check In: ? Session Check In - 09/28/21 0924   ? ?  ? Check-In  ? Supervising physician immediately available to respond to emergencies [facility] MD immediately available   ? Physician(s) Dr. Domenic Polite   ? Location [facility]   ? Staff Present Redge Gainer, BS, Exercise Physiologist;Hina Gupta Wynetta Emery, RN, Bjorn Loser, MS, ACSM-CEP, Exercise Physiologist   ? Virtual Visit No   ? Medication changes reported     No   ? Fall or balance concerns reported    No   ? Tobacco Cessation No Change   ? Warm-up and Cool-down Performed as group-led instruction   ? Resistance Training Performed Yes   ? VAD Patient? No   ? PAD/SET Patient? No   ?  ? Pain Assessment  ? Currently in Pain? No/denies   ? Multiple Pain Sites No   ? ?  ?  ? ?  ? ? ?Capillary Blood Glucose: ?No results found for this or any previous visit (from the past 24 hour(s)). ? ? ? ?Social History  ? ?Tobacco Use  ?Smoking Status Former  ? Types: Cigarettes  ? Quit date: 07/13/1998  ? Years since quitting: 23.2  ?Smokeless Tobacco Never  ? ? ?Goals Met:  ?Independence with exercise equipment ?Exercise tolerated well ?No report of concerns or symptoms today ?Strength training completed today ? ?Goals Unmet:  ?Not Applicable ? ?Comments: Check out 1030. ? ? ?Dr. Carlyle Dolly is Medical Director for [place] ?

## 2021-10-01 ENCOUNTER — Encounter (HOSPITAL_COMMUNITY)
Admission: RE | Admit: 2021-10-01 | Discharge: 2021-10-01 | Disposition: A | Discharge status: Home / Self Care | Payer: Medicare Other | Source: Ambulatory Visit | Attending: Cardiology | Admitting: Cardiology

## 2021-10-01 DIAGNOSIS — Z955 Presence of coronary angioplasty implant and graft: Secondary | ICD-10-CM | POA: Insufficient documentation

## 2021-10-01 NOTE — Progress Notes (Signed)
Daily Session Note ? ?Patient Details  ?Name: Colleen Cole ?MRN: [id number] ?Date of Birth: [birth year] ?Referring Provider:   ?Flowsheet Row CARDIAC REHAB PHASE II ORIENTATION from 09/18/2021 in [place]  ?Referring Provider Dr. Burt Knack  ? ?  ? ? ?Encounter Date: 10/01/2021 ? ?Check In: ? Session Check In - 10/01/21 0930   ? ?  ? Check-In  ? Supervising physician immediately available to respond to emergencies [facility] MD immediately available   ? Physician(s) Dr. Harl Bowie   ? Location [facility]   ? Staff Present Redge Gainer, BS, Exercise Physiologist;Debra Wynetta Emery, RN, Bjorn Loser, MS, ACSM-CEP, Exercise Physiologist   ? Virtual Visit No   ? Medication changes reported     No   ? Fall or balance concerns reported    No   ? Tobacco Cessation No Change   ? Warm-up and Cool-down Performed as group-led instruction   ? Resistance Training Performed Yes   ? VAD Patient? No   ? PAD/SET Patient? No   ?  ? Pain Assessment  ? Currently in Pain? No/denies   ? Multiple Pain Sites No   ? ?  ?  ? ?  ? ? ?Capillary Blood Glucose: ?No results found for this or any previous visit (from the past 24 hour(s)). ? ? ? ?Social History  ? ?Tobacco Use  ?Smoking Status Former  ? Types: Cigarettes  ? Quit date: 07/13/1998  ? Years since quitting: 23.2  ?Smokeless Tobacco Never  ? ? ?Goals Met:  ?Independence with exercise equipment ?Exercise tolerated well ?No report of concerns or symptoms today ?Strength training completed today ? ?Goals Unmet:  ?Not Applicable ? ?Comments: check out 1030 ? ? ?Dr. Carlyle Dolly is Medical Director for [place] ?

## 2021-10-01 NOTE — Progress Notes (Signed)
I have reviewed a Home Exercise Prescription with Colleen Cole . Colleen Cole is  currently exercising at home.  The patient was advised to walk 2 days a week for 30-45 minutes.  Irini and I discussed how to progress their exercise prescription.  The patient stated that their goals were build back her endurance and lose weight.  The patient stated that they understand the exercise prescription.  We reviewed exercise guidelines, target heart rate during exercise, RPE Scale, weather conditions, NTG use, endpoints for exercise, warmup and cool down.  Patient is encouraged to come to me with any questions. I will continue to follow up with the patient to assist them with progression and safety.    ?

## 2021-10-03 ENCOUNTER — Encounter (HOSPITAL_COMMUNITY)
Admission: RE | Admit: 2021-10-03 | Discharge: 2021-10-03 | Disposition: A | Discharge status: Home / Self Care | Payer: Medicare Other | Source: Ambulatory Visit | Attending: Cardiology | Admitting: Cardiology

## 2021-10-03 ENCOUNTER — Encounter (HOSPITAL_COMMUNITY): Payer: Medicare Other

## 2021-10-03 DIAGNOSIS — Z955 Presence of coronary angioplasty implant and graft: Secondary | ICD-10-CM

## 2021-10-03 NOTE — Progress Notes (Signed)
Daily Session Note ? ?Patient Details  ?Name: Colleen Cole ?MRN: [id number] ?Date of Birth: [birth year] ?Referring Provider:   ?Flowsheet Row CARDIAC REHAB PHASE II ORIENTATION from 09/18/2021 in [place]  ?Referring Provider Dr. Burt Knack  ? ?  ? ? ?Encounter Date: 10/03/2021 ? ?Check In: ? Session Check In - 10/03/21 0930   ? ?  ? Check-In  ? Supervising physician immediately available to respond to emergencies [facility] MD immediately available   ? Physician(s) Dr. Gardiner Rhyme   ? Location [facility]   ? Staff Present Geanie Cooley, RN;Dalton Kris Mouton, MS, ACSM-CEP, Exercise Physiologist;Heather Zigmund Daniel, Exercise Physiologist   ? Virtual Visit No   ? Medication changes reported     No   ? Fall or balance concerns reported    No   ? Tobacco Cessation No Change   ? Warm-up and Cool-down Performed as group-led instruction   ? Resistance Training Performed Yes   ? VAD Patient? No   ? PAD/SET Patient? No   ?  ? Pain Assessment  ? Currently in Pain? No/denies   ? Multiple Pain Sites No   ? ?  ?  ? ?  ? ? ?Capillary Blood Glucose: ?No results found for this or any previous visit (from the past 24 hour(s)). ? ? ? ?Social History  ? ?Tobacco Use  ?Smoking Status Former  ? Types: Cigarettes  ? Quit date: 07/13/1998  ? Years since quitting: 23.2  ?Smokeless Tobacco Never  ? ? ?Goals Met:  ?Independence with exercise equipment ?Exercise tolerated well ?No report of concerns or symptoms today ?Strength training completed today ? ?Goals Unmet:  ?Not Applicable ? ?Comments: check out @10 :30 ? ? ?Dr. Carlyle Dolly is Medical Director for [place] ?

## 2021-10-05 ENCOUNTER — Encounter (HOSPITAL_COMMUNITY): Payer: Medicare Other

## 2021-10-08 ENCOUNTER — Encounter (HOSPITAL_COMMUNITY): Payer: Medicare Other

## 2021-10-10 ENCOUNTER — Encounter (HOSPITAL_COMMUNITY): Payer: Medicare Other

## 2021-10-12 ENCOUNTER — Encounter (HOSPITAL_COMMUNITY): Payer: Medicare Other

## 2021-10-15 ENCOUNTER — Encounter (HOSPITAL_COMMUNITY)
Admission: RE | Admit: 2021-10-15 | Discharge: 2021-10-15 | Disposition: A | Discharge status: Home / Self Care | Payer: Medicare Other | Source: Ambulatory Visit | Attending: Cardiology | Admitting: Cardiology

## 2021-10-15 DIAGNOSIS — Z955 Presence of coronary angioplasty implant and graft: Secondary | ICD-10-CM | POA: Diagnosis not present

## 2021-10-15 NOTE — Progress Notes (Signed)
Daily Session Note ? ?Patient Details  ?Name: Colleen Cole ?MRN: [id number] ?Date of Birth: December 14, 1945 ?Referring Provider:   ?Flowsheet Row CARDIAC REHAB PHASE II ORIENTATION from 09/18/2021 in [place]  ?Referring Provider Dr. Burt Knack  ? ?  ? ? ?Encounter Date: 10/15/2021 ? ?Check In: ? Session Check In - 10/15/21 0923   ? ?  ? Check-In  ? Supervising physician immediately available to respond to emergencies [facility] MD immediately available   ? Physician(s) Dr Harl Bowie   ? Location [facility]   ? Staff Present Redge Gainer, BS, Exercise Physiologist;Dalton Fletcher, MS, ACSM-CEP, Exercise Physiologist   ? Virtual Visit No   ? Medication changes reported     No   ? Fall or balance concerns reported    No   ? Tobacco Cessation No Change   ? Warm-up and Cool-down Performed as group-led instruction   ? Resistance Training Performed Yes   ? VAD Patient? No   ? PAD/SET Patient? No   ?  ? Pain Assessment  ? Currently in Pain? No/denies   ? Multiple Pain Sites No   ? ?  ?  ? ?  ? ? ?Capillary Blood Glucose: ?No results found for this or any previous visit (from the past 24 hour(s)). ? ? ? ?Social History  ? ?Tobacco Use  ?Smoking Status Former  ? Types: Cigarettes  ? Quit date: 07/13/1998  ? Years since quitting: 23.2  ?Smokeless Tobacco Never  ? ? ?Goals Met:  ?Independence with exercise equipment ?Exercise tolerated well ?No report of concerns or symptoms today ?Strength training completed today ? ?Goals Unmet:  ?Not Applicable ? ?Comments: Check out at 1030. ? ? ?Dr. Carlyle Dolly is Medical Director for [place] ?

## 2021-10-17 ENCOUNTER — Encounter: Payer: Self-pay | Admitting: Cardiology

## 2021-10-17 ENCOUNTER — Encounter (HOSPITAL_COMMUNITY)
Admission: RE | Admit: 2021-10-17 | Discharge: 2021-10-17 | Disposition: A | Discharge status: Home / Self Care | Payer: Medicare Other | Source: Ambulatory Visit | Attending: Cardiology | Admitting: Cardiology

## 2021-10-17 ENCOUNTER — Ambulatory Visit (INDEPENDENT_AMBULATORY_CARE_PROVIDER_SITE_OTHER): Payer: Medicare Other | Admitting: Cardiology

## 2021-10-17 ENCOUNTER — Ambulatory Visit: Payer: Medicare Other | Admitting: Cardiology

## 2021-10-17 DIAGNOSIS — Z955 Presence of coronary angioplasty implant and graft: Secondary | ICD-10-CM | POA: Diagnosis not present

## 2021-10-17 DIAGNOSIS — I209 Angina pectoris, unspecified: Secondary | ICD-10-CM | POA: Diagnosis not present

## 2021-10-17 DIAGNOSIS — I480 Paroxysmal atrial fibrillation: Secondary | ICD-10-CM

## 2021-10-17 DIAGNOSIS — Z7901 Long term (current) use of anticoagulants: Secondary | ICD-10-CM

## 2021-10-17 DIAGNOSIS — I25119 Atherosclerotic heart disease of native coronary artery with unspecified angina pectoris: Secondary | ICD-10-CM

## 2021-10-17 NOTE — Progress Notes (Signed)
?Cardiology Office Note:   ? ?Date:  10/17/2021  ? ?ID:  Colleen Cole, DOB 1945-09-14, MRN [id number] ? ?PCP:  Caren Macadam, MD ?  ?[facility] Providers ?Cardiologist:  Candee Furbish, MD    ? ?Referring MD: Caren Macadam, MD  ? ? ?History of Present Illness:   ? ?Colleen Cole is a 76 y.o. female here for the follow-up of atrial fibrillation and coronary artery disease.  ? ?At her appointment 09/06/2021, she complained of immediate shortness of breath and dizziness after standing up. Prompting that visit she had called the office noting she was in atrial fibrillation around 1 PM. Her last episode had been 2 years prior. She was started on amiodarone 200 mg twice daily for 2 weeks, then 200 mg daily thereafter. That same day she was set up for a consult with Dr. Curt Bears. Afterwards she wished to plan for ablation. ? ?She called the office 09/10/2021 and reported that she had reverted to sinus rhythm at 65 bpm, on amiodarone 200 mg BID and metoprolol 25 BID. Currently she is still scheduled for ablation on 12/26/2021. ? ? ?Previously here for the evaluation of worsening shortness of breath.  She has paroxysmal atrial fibrillation on Xarelto, coronary artery calcification/atherosclerosis with calcium score on 01/11/2020 of 711, hyperlipidemia, mild carotid plaque bilaterally. ? ?Back in July 2020 while at a horse show ([place] she had an episode of shortness of breath, presyncope and was taken to the emergency room was found to be in atrial fibrillation with rapid ventricular response.  At the time she was treated with IV metoprolol and auto converted.  Lab work was unremarkable at that time. ? ?In 01/11/2020 she had an echocardiogram which showed normal pump function. ? ?A coronary calcium score was performed on 01/11/2020 with a score of 711 and therefore she was started on atorvastatin.  Her LDL was reduced from 97 down to 55.  She was not having any anginal symptoms at the time of her  calcium score. ? ?Her father had CABG in his 57s.  She enjoys golf, very active.  She is retired from [employer] . ? ?Occasional she will have nuisance bleeding from Xarelto. ? ?Had COVID 02/2021. ? ?Today: ?Overall she feels good, with no chest pain or significant shortness of breath. She continues to work on building her strength. Earlier this morning she was at cardiac rehab and doing well. ? ?She presents a blood pressure log, notable for lower blood pressures in the morning. Generally controlled blood pressure at home. Sometimes by mid afternoon her BP may be higher such as in the [id number] systolic. ? ?For activity she continues to enjoy golf and horse riding. ? ?She denies any palpitations, chest pain, or peripheral edema. No lightheadedness, headaches, syncope, orthopnea, or PND. ? ?Of note, she sometimes feels like "there is something moving" in her ears. ? ?We reviewed procedural details at length of her upcoming ablation in late July 2023. ? ? ? ? ?Past Medical History:  ?Diagnosis Date  ? Hypertension   ? ? ?Past Surgical History:  ?Procedure Laterality Date  ? BREAST EXCISIONAL BIOPSY Right 1995  ? NEG  ? CORONARY STENT INTERVENTION N/A 08/27/2021  ? Procedure: CORONARY STENT INTERVENTION;  Surgeon: Sherren Mocha, MD;  Location: [place] [facility];  Service: Cardiovascular;  Laterality: N/A;  ? FRACTURE SURGERY  2007  ? fracture in left leg-at patella, tibia and fibula  ? INTRAVASCULAR PRESSURE WIRE/FFR STUDY N/A 08/27/2021  ? Procedure: INTRAVASCULAR PRESSURE  WIRE/FFR STUDY;  Surgeon: Sherren Mocha, MD;  Location: [place] [facility];  Service: Cardiovascular;  Laterality: N/A;  ? LEFT HEART CATH AND CORONARY ANGIOGRAPHY N/A 08/27/2021  ? Procedure: LEFT HEART CATH AND CORONARY ANGIOGRAPHY;  Surgeon: Sherren Mocha, MD;  Location: [place] [facility];  Service: Cardiovascular;  Laterality: N/A;  ? TOTAL HIP ARTHROPLASTY Right 08/04/2018  ? Procedure: TOTAL HIP ARTHROPLASTY ANTERIOR APPROACH;   Surgeon: Melrose Nakayama, MD;  Location: [facility];  Service: Orthopedics;  Laterality: Right;  ? ? ?Current Medications: ?Current Meds  ?Medication Sig  ? acetaminophen (TYLENOL) 500 MG tablet Take 1,000 mg by mouth every 6 (six) hours as needed for mild pain or headache.  ? alendronate (FOSAMAX) 70 MG tablet Take 70 mg by mouth every Sunday.  ? amiodarone (PACERONE) 200 MG tablet Take (1) tablet twice a day for (2) weeks then decrease to (1) tablet daily  ? amLODipine (NORVASC) 5 MG tablet Take 10 mg by mouth daily.  ? Calcium Carb-Cholecalciferol (CALCIUM 600 + D PO) Take 1 tablet by mouth 2 (two) times daily.  ? cholecalciferol (VITAMIN D3) 25 MCG (1000 UNIT) tablet Take 1,000 Units by mouth daily.  ? clopidogrel (PLAVIX) 75 MG tablet Take 1 tablet (75 mg total) by mouth daily.  ? Docusate Calcium (STOOL SOFTENER PO) Take 2 capsules by mouth at bedtime.  ? escitalopram (LEXAPRO) 5 MG tablet Take 5 mg by mouth daily.  ? fluticasone (FLONASE) 50 MCG/ACT nasal spray Place 1 spray into both nostrils daily as needed for allergies or rhinitis.  ? hydrALAZINE (APRESOLINE) 25 MG tablet Take 1 tablet (25 mg total) by mouth 2 (two) times daily. (Patient taking differently: Take 25-50 mg by mouth See admin instructions. Take 50 mg in the morning, 25 mg in the afternoon, and 50 mg at night)  ? hydrochlorothiazide (HYDRODIURIL) 25 MG tablet Take 25 mg by mouth daily.  ? levocetirizine (XYZAL) 5 MG tablet Take 5 mg by mouth daily as needed for allergies.  ? losartan (COZAAR) 100 MG tablet Take 100 mg by mouth every evening.  ? metoprolol tartrate (LOPRESSOR) 25 MG tablet TAKE 1 TABLET(25 MG) BY MOUTH TWICE DAILY  ? nitroGLYCERIN (NITROSTAT) 0.4 MG SL tablet Place 1 tablet (0.4 mg total) under the tongue every 5 (five) minutes as needed.  ? OZEMPIC, 1 MG/DOSE, 4 MG/3ML SOPN Inject 1 mg into the skin every Thursday.  ? rosuvastatin (CRESTOR) 20 MG tablet Take 1 tablet (20 mg total) by mouth daily.  ? senna (SENOKOT) 8.6 MG TABS  tablet Take 2 tablets by mouth at bedtime.  ? XARELTO 20 MG TABS tablet Take 1 tablet (20 mg total) by mouth daily.  ?  ? ?Allergies:   Patient has no known allergies.  ? ?Social History  ? ?Socioeconomic History  ? Marital status: Single  ?  Spouse name: Not on file  ? Number of children: Not on file  ? Years of education: Not on file  ? Highest education level: Not on file  ?Occupational History  ? Not on file  ?Tobacco Use  ? Smoking status: Former  ?  Types: Cigarettes  ?  Quit date: 07/13/1998  ?  Years since quitting: 23.2  ? Smokeless tobacco: Never  ?Vaping Use  ? Vaping Use: Never used  ?Substance and Sexual Activity  ? Alcohol use: Yes  ?  Comment: occassionally  ? Drug use: Never  ? Sexual activity: Not on file  ?Other Topics Concern  ? Not on file  ?  Social History Narrative  ? Not on file  ? ?Social Determinants of Health  ? ?Financial Resource Strain: Not on file  ?Food Insecurity: Not on file  ?Transportation Needs: Not on file  ?Physical Activity: Not on file  ?Stress: Not on file  ?Social Connections: Not on file  ?  ? ?Family History: ?The patient's family history is negative for Breast cancer. ? ?ROS:   ?Please see the history of present illness.    ?All other systems reviewed and are negative. ? ?EKGs/Labs/Other Studies Reviewed:   ? ?The following studies were reviewed today: ? ?Left Heart Cath 08/27/2021: ?Severe stenosis of the LAD just after the first septal perforator, treated successfully with pressure wire guided PCI using a 2.5 x 20 mm Synergy DES ?Widely patent left main, left circumflex, and RCA with mild nonobstructive plaquing ?  ?Recommendations: Continue clopidogrel x6 months, resume rivaroxaban tomorrow.  Would avoid aspirin in the setting of chronic oral anticoagulation plus clopidogrel in order to avoid excessive bleeding risk. ? ?Diagnostic: ?Dominance: Right ? ? ?Intervention: ? ? ? ?Coronary CTA  08/22/2021: ?FINDINGS: ?Scan was triggered in the descending thoracic aorta.  Axial ?non-contrast 3 mm slices were carried out through the heart. The ?data set was analyzed on a dedicated work station and scored using ?the Agatson method. Gantry rotation speed was 250 msecs and ?collimation was .6 mm

## 2021-10-17 NOTE — Assessment & Plan Note (Signed)
Currently on amiodarone 200 mg once a day.  Maintaining sinus rhythm.  Awaiting ablation at the end of July, Dr. Elberta Fortis.  Excellent. ?

## 2021-10-17 NOTE — Progress Notes (Signed)
Daily Session Note ? ?Patient Details  ?Name: Colleen Cole ?MRN: [id number] ?Date of Birth: 1945-06-08 ?Referring Provider:   ?Flowsheet Row CARDIAC REHAB PHASE II ORIENTATION from 09/18/2021 in [place]  ?Referring Provider Dr. Burt Knack  ? ?  ? ? ?Encounter Date: 10/17/2021 ? ?Check In: ? Session Check In - 10/17/21 0929   ? ?  ? Check-In  ? Supervising physician immediately available to respond to emergencies [facility] MD immediately available   ? Physician(s) Dr Harl Bowie   ? Location [facility]   ? Staff Present Geanie Cooley, RN;Dalton Kris Mouton, MS, ACSM-CEP, Exercise Physiologist;Heather Zigmund Daniel, Exercise Physiologist   ? Virtual Visit No   ? Medication changes reported     No   ? Fall or balance concerns reported    No   ? Tobacco Cessation No Change   ? Warm-up and Cool-down Performed as group-led instruction   ? Resistance Training Performed Yes   ? VAD Patient? No   ? PAD/SET Patient? No   ?  ? Pain Assessment  ? Currently in Pain? No/denies   ? Multiple Pain Sites No   ? ?  ?  ? ?  ? ? ?Capillary Blood Glucose: ?No results found for this or any previous visit (from the past 24 hour(s)). ? ? ? ?Social History  ? ?Tobacco Use  ?Smoking Status Former  ? Types: Cigarettes  ? Quit date: 07/13/1998  ? Years since quitting: 23.2  ?Smokeless Tobacco Never  ? ? ?Goals Met:  ?Independence with exercise equipment ?Exercise tolerated well ?No report of concerns or symptoms today ?Strength training completed today ? ?Goals Unmet:  ?Not Applicable ? ?Comments: checkout @ 10:30am ? ? ?Dr. Carlyle Dolly is Medical Director for [place] ?

## 2021-10-17 NOTE — Assessment & Plan Note (Signed)
Xarelto 20. ?

## 2021-10-17 NOTE — Assessment & Plan Note (Addendum)
LAD stent, Dr. Burt Knack.  Personally reviewed films showed her.  Continue with goal-directed medical therapy.  We will continue Plavix for a total of 6 months.  In September late we can discontinue.  She will continue with Xarelto given her atrial fibrillation.  She is not having any bleeding issues.  Discussed.  Hemoglobin 12.5 creatinine 0.73.  Cardiac rehab.  Feeling much better.  Continue to progress. ?

## 2021-10-17 NOTE — Patient Instructions (Signed)

## 2021-10-19 ENCOUNTER — Encounter (HOSPITAL_COMMUNITY)
Admission: RE | Admit: 2021-10-19 | Discharge: 2021-10-19 | Disposition: A | Discharge status: Home / Self Care | Payer: Medicare Other | Source: Ambulatory Visit | Attending: Cardiology | Admitting: Cardiology

## 2021-10-19 DIAGNOSIS — Z955 Presence of coronary angioplasty implant and graft: Secondary | ICD-10-CM

## 2021-10-19 NOTE — Progress Notes (Signed)
Daily Session Note  Patient Details  Name: Colleen Cole MRN: [id number] Date of Birth: 05-24-1946 Referring Provider:   Flowsheet Row CARDIAC REHAB PHASE II ORIENTATION from 09/18/2021 in [place]  Referring Provider Dr. Burt Knack       Encounter Date: 10/19/2021  Check In:  Session Check In - 10/19/21 0930       Check-In   Supervising physician immediately available to respond to emergencies [facility] MD immediately available    Physician(s) Dr Vanita Panda    Location [facility]    Staff Present Geanie Cooley, RN;Heather Otho Ket, BS, Exercise Physiologist;Cloee Dunwoody Hassell Done, RN, BSN    Virtual Visit No    Medication changes reported     No    Fall or balance concerns reported    No    Tobacco Cessation No Change    Warm-up and Cool-down Not performed (comment)    Resistance Training Performed Yes    VAD Patient? No    PAD/SET Patient? No      Pain Assessment   Currently in Pain? No/denies    Multiple Pain Sites No             Capillary Blood Glucose: No results found for this or any previous visit (from the past 24 hour(s)).    Social History   Tobacco Use  Smoking Status Former   Types: Cigarettes   Quit date: 07/13/1998   Years since quitting: 23.2  Smokeless Tobacco Never    Goals Met:  Independence with exercise equipment Exercise tolerated well No report of concerns or symptoms today Strength training completed today  Goals Unmet:  Not Applicable  Comments: check out at 1030.   Dr. Carlyle Dolly is Medical Director for [facility]

## 2021-10-22 ENCOUNTER — Encounter (HOSPITAL_COMMUNITY)
Admission: RE | Admit: 2021-10-22 | Discharge: 2021-10-22 | Disposition: A | Discharge status: Home / Self Care | Payer: Medicare Other | Source: Ambulatory Visit | Attending: Cardiology | Admitting: Cardiology

## 2021-10-22 VITALS — Wt 185.0 lb

## 2021-10-22 DIAGNOSIS — Z955 Presence of coronary angioplasty implant and graft: Secondary | ICD-10-CM

## 2021-10-22 NOTE — Progress Notes (Signed)
Daily Session Note  Patient Details  Name: Colleen Cole MRN: [id number] Date of Birth: 12/28/1945 Referring Provider:   Flowsheet Row CARDIAC REHAB PHASE II ORIENTATION from 09/18/2021 in [place] [facility]  Referring Provider Dr. Cooper       Encounter Date: 10/22/2021  Check In:  Session Check In - 10/22/21 0930       Check-In   Supervising physician immediately available to respond to emergencies [facility] MD immediately available    Physician(s) Dr. Shumann    Location [facility]    Staff Present Phyllis Billingsley, RN;Heather Jachimiak, BS, Exercise Physiologist;Daphyne Martin, RN, BSN    Virtual Visit No    Medication changes reported     No    Fall or balance concerns reported    No    Tobacco Cessation No Change    Warm-up and Cool-down Performed as group-led instruction    Resistance Training Performed Yes    VAD Patient? No    PAD/SET Patient? No      Pain Assessment   Currently in Pain? No/denies    Multiple Pain Sites No             Capillary Blood Glucose: No results found for this or any previous visit (from the past 24 hour(s)).    Social History   Tobacco Use  Smoking Status Former   Types: Cigarettes   Quit date: 07/13/1998   Years since quitting: 23.2  Smokeless Tobacco Never    Goals Met:  Independence with exercise equipment Exercise tolerated well No report of concerns or symptoms today Strength training completed today  Goals Unmet:  Not Applicable  Comments: checkout @ 10:30am   Dr. Jonathan Branch is Medical Director for [place] [facility] 

## 2021-10-24 ENCOUNTER — Encounter (HOSPITAL_COMMUNITY)
Admission: RE | Admit: 2021-10-24 | Discharge: 2021-10-24 | Disposition: A | Discharge status: Home / Self Care | Payer: Medicare Other | Source: Ambulatory Visit | Attending: Cardiology | Admitting: Cardiology

## 2021-10-24 DIAGNOSIS — Z955 Presence of coronary angioplasty implant and graft: Secondary | ICD-10-CM | POA: Diagnosis not present

## 2021-10-24 NOTE — Progress Notes (Signed)
Daily Session Note  Patient Details  Name: Colleen Cole MRN: [id number] Date of Birth: 04-27-1946 Referring Provider:   Flowsheet Row CARDIAC REHAB PHASE II ORIENTATION from 09/18/2021 in [place]  Referring Provider Dr. Burt Knack       Encounter Date: 10/24/2021  Check In:  Session Check In - 10/24/21 0930       Check-In   Supervising physician immediately available to respond to emergencies [facility] MD immediately available    Physician(s) Dr Domenic Polite    Location [facility]    Staff Present Redge Gainer, BS, Exercise Physiologist;Jadah Bobak Hassell Done, RN, Bjorn Loser, MS, ACSM-CEP, Exercise Physiologist    Virtual Visit No    Medication changes reported     No    Fall or balance concerns reported    No    Tobacco Cessation No Change    Warm-up and Cool-down Performed as group-led instruction    Resistance Training Performed Yes    VAD Patient? No    PAD/SET Patient? No      Pain Assessment   Currently in Pain? No/denies    Multiple Pain Sites No             Capillary Blood Glucose: No results found for this or any previous visit (from the past 24 hour(s)).    Social History   Tobacco Use  Smoking Status Former   Types: Cigarettes   Quit date: 07/13/1998   Years since quitting: 23.2  Smokeless Tobacco Never    Goals Met:  Independence with exercise equipment Exercise tolerated well No report of concerns or symptoms today Strength training completed today  Goals Unmet:  Not Applicable  Comments: Checkout at 1030.   Dr. Carlyle Dolly is Medical Director for [facility] Card[facility]

## 2021-10-24 NOTE — Progress Notes (Signed)
Cardiac Individual Treatment Plan  Patient Details  Name: Colleen Cole MRN: [id number] Date of Birth: 1945/11/09 Referring Provider:   Flowsheet Row CARDIAC REHAB PHASE II ORIENTATION from 09/18/2021 in [place]  Referring Provider Dr. Burt Knack       Initial Encounter Date:  Flowsheet Row CARDIAC REHAB PHASE II ORIENTATION from 09/18/2021 in [place]  Date 09/18/21       Visit Diagnosis: S/P coronary artery stent placement  Patient's Home Medications on Admission:  Current Outpatient Medications:    acetaminophen (TYLENOL) 500 MG tablet, Take 1,000 mg by mouth every 6 (six) hours as needed for mild pain or headache., Disp: , Rfl:    alendronate (FOSAMAX) 70 MG tablet, Take 70 mg by mouth every Sunday., Disp: , Rfl:    amiodarone (PACERONE) 200 MG tablet, Take (1) tablet twice a day for (2) weeks then decrease to (1) tablet daily, Disp: 105 tablet, Rfl: 3   amLODipine (NORVASC) 5 MG tablet, Take 10 mg by mouth daily., Disp: , Rfl:    Calcium Carb-Cholecalciferol (CALCIUM 600 + D PO), Take 1 tablet by mouth 2 (two) times daily., Disp: , Rfl:    cholecalciferol (VITAMIN D3) 25 MCG (1000 UNIT) tablet, Take 1,000 Units by mouth daily., Disp: , Rfl:    clopidogrel (PLAVIX) 75 MG tablet, Take 1 tablet (75 mg total) by mouth daily., Disp: 90 tablet, Rfl: 1   Docusate Calcium (STOOL SOFTENER PO), Take 2 capsules by mouth at bedtime., Disp: , Rfl:    escitalopram (LEXAPRO) 5 MG tablet, Take 5 mg by mouth daily., Disp: , Rfl:    fluticasone (FLONASE) 50 MCG/ACT nasal spray, Place 1 spray into both nostrils daily as needed for allergies or rhinitis., Disp: , Rfl:    hydrALAZINE (APRESOLINE) 25 MG tablet, Take 1 tablet (25 mg total) by mouth 2 (two) times daily. (Patient taking differently: Take 25-50 mg by mouth See admin instructions. Take 50 mg in the morning, 25 mg in the afternoon, and 50 mg at night), Disp: 180 tablet, Rfl: 3    hydrochlorothiazide (HYDRODIURIL) 25 MG tablet, Take 25 mg by mouth daily., Disp: , Rfl:    levocetirizine (XYZAL) 5 MG tablet, Take 5 mg by mouth daily as needed for allergies., Disp: , Rfl:    losartan (COZAAR) 100 MG tablet, Take 100 mg by mouth every evening., Disp: , Rfl:    metoprolol tartrate (LOPRESSOR) 25 MG tablet, TAKE 1 TABLET(25 MG) BY MOUTH TWICE DAILY, Disp: 180 tablet, Rfl: 3   nitroGLYCERIN (NITROSTAT) 0.4 MG SL tablet, Place 1 tablet (0.4 mg total) under the tongue every 5 (five) minutes as needed., Disp: 25 tablet, Rfl: 2   OZEMPIC, 1 MG/DOSE, 4 MG/3ML SOPN, Inject 1 mg into the skin every Thursday., Disp: , Rfl:    rosuvastatin (CRESTOR) 20 MG tablet, Take 1 tablet (20 mg total) by mouth daily., Disp: 90 tablet, Rfl: 3   senna (SENOKOT) 8.6 MG TABS tablet, Take 2 tablets by mouth at bedtime., Disp: , Rfl:    XARELTO 20 MG TABS tablet, Take 1 tablet (20 mg total) by mouth daily., Disp: 90 tablet, Rfl: 3  Past Medical History: Past Medical History:  Diagnosis Date   Hypertension     Tobacco Use: Social History   Tobacco Use  Smoking Status Former   Types: Cigarettes   Quit date: 07/13/1998   Years since quitting: 23.2  Smokeless Tobacco Never    Labs: Review Flowsheet  Latest Ref Rng & Units 01/17/2020 05/17/2020 06/12/2021  Labs for ITP Cardiac and Pulmonary Rehab  Cholestrol 100 - 199 mg/dL 178   136   151    LDL (calc) 0 - 99 mg/dL 97   55   57    HDL-C >39 mg/dL 66   67   81    Trlycerides 0 - 149 mg/dL 83   69   63            Capillary Blood Glucose: Lab Results  Component Value Date   GLUCAP 103 (H) 09/24/2021   GLUCAP 108 (H) 09/18/2021     Exercise Target Goals: Exercise Program Goal: Individual exercise prescription set using results from initial 6 min walk test and THRR while considering  patient's activity barriers and safety.   Exercise Prescription Goal: Starting with aerobic activity 30 plus minutes a day, 3 days per week for  initial exercise prescription. Provide home exercise prescription and guidelines that participant acknowledges understanding prior to discharge.  Activity Barriers & Risk Stratification:  Activity Barriers & Cardiac Risk Stratification - 09/18/21 0837       Activity Barriers & Cardiac Risk Stratification   Activity Barriers Right Hip Replacement;Arthritis;Joint Problems    Cardiac Risk Stratification High             6 Minute Walk:  6 Minute Walk     Row Name 09/18/21 0934         6 Minute Walk   Phase Initial     Distance 1500 feet     Walk Time 6 minutes     # of Rest Breaks 0     MPH 2.84     METS 2.5     RPE 12     VO2 Peak 8.87     Symptoms No     Resting HR 64 bpm     Resting BP 108/68     Resting Oxygen Saturation  95 %     Exercise Oxygen Saturation  during 6 min walk 96 %     Max Ex. HR 77 bpm     Max Ex. BP 136/60     2 Minute Post BP 118/60              Oxygen Initial Assessment:   Oxygen Re-Evaluation:   Oxygen Discharge (Final Oxygen Re-Evaluation):   Initial Exercise Prescription:  Initial Exercise Prescription - 09/18/21 0900       Date of Initial Exercise RX and Referring Provider   Date 09/18/21    Referring Provider Dr. Burt Knack    Expected Discharge Date 12/14/21      Treadmill   MPH 2.5    Grade 0    Minutes 17      NuStep   Level 1    SPM 60    Minutes 22      Prescription Details   Frequency (times per week) 3    Duration Progress to 30 minutes of continuous aerobic without signs/symptoms of physical distress      Intensity   THRR 40-80% of Max Heartrate 59-118    Ratings of Perceived Exertion 11-13      Resistance Training   Training Prescription Yes    Weight 3    Reps 10-15             Perform Capillary Blood Glucose checks as needed.  Exercise Prescription Changes:   Exercise Prescription Changes     Row Name 09/24/21 1000 10/01/21  1000 10/03/21 1025 10/22/21 1300       Response to Exercise    Blood Pressure (Admit) 128/60 -- 102/50 108/64    Blood Pressure (Exercise) 128/58 -- 122/80 122/52    Blood Pressure (Exit) 116/60 -- 108/60 92/50    Heart Rate (Admit) 67 bpm -- 65 bpm 63 bpm    Heart Rate (Exercise) 79 bpm -- 83 bpm 100 bpm    Heart Rate (Exit) 71 bpm -- 74 bpm 72 bpm    Rating of Perceived Exertion (Exercise) 11 -- 13 13    Duration Continue with 30 min of aerobic exercise without signs/symptoms of physical distress. -- Continue with 30 min of aerobic exercise without signs/symptoms of physical distress. Continue with 30 min of aerobic exercise without signs/symptoms of physical distress.    Intensity THRR unchanged -- THRR unchanged THRR unchanged      Progression   Progression Continue to progress workloads to maintain intensity without signs/symptoms of physical distress. -- Continue to progress workloads to maintain intensity without signs/symptoms of physical distress. Continue to progress workloads to maintain intensity without signs/symptoms of physical distress.      Resistance Training   Training Prescription Yes -- Yes Yes    Weight 3 -- 4 4    Reps 10-15 -- 10-15 10-15    Time 10 Minutes -- 10 Minutes 10 Minutes      Treadmill   MPH 2.5 -- 2.7 3    Grade 0 -- 0.5 2    Minutes 17 -- 17 17    METs 2.91 -- 3.25 4.12      NuStep   Level 1 -- 1 1    SPM 114 -- 122 107    Minutes 22 -- 22 22    METs 3.29 -- 3.37 3.65      Home Exercise Plan   Plans to continue exercise at -- Home (comment) -- --    Frequency -- Add 2 additional days to program exercise sessions. -- --    Initial Home Exercises Provided -- 10/01/21 -- --             Exercise Comments:   Exercise Goals and Review:   Exercise Goals     Row Name 09/18/21 [id number] 09/24/21 1046 10/22/21 1357         Exercise Goals   Increase Physical Activity Yes Yes Yes     Intervention Provide advice, education, support and counseling about physical activity/exercise needs.;Develop an  individualized exercise prescription for aerobic and resistive training based on initial evaluation findings, risk stratification, comorbidities and participant's personal goals. Provide advice, education, support and counseling about physical activity/exercise needs.;Develop an individualized exercise prescription for aerobic and resistive training based on initial evaluation findings, risk stratification, comorbidities and participant's personal goals. Provide advice, education, support and counseling about physical activity/exercise needs.;Develop an individualized exercise prescription for aerobic and resistive training based on initial evaluation findings, risk stratification, comorbidities and participant's personal goals.     Expected Outcomes Short Term: Attend rehab on a regular basis to increase amount of physical activity.;Long Term: Add in home exercise to make exercise part of routine and to increase amount of physical activity.;Long Term: Exercising regularly at least 3-5 days a week. Short Term: Attend rehab on a regular basis to increase amount of physical activity.;Long Term: Add in home exercise to make exercise part of routine and to increase amount of physical activity.;Long Term: Exercising regularly at least 3-5 days a week. Short Term: Attend rehab  on a regular basis to increase amount of physical activity.;Long Term: Add in home exercise to make exercise part of routine and to increase amount of physical activity.;Long Term: Exercising regularly at least 3-5 days a week.     Increase Strength and Stamina Yes Yes Yes     Intervention Provide advice, education, support and counseling about physical activity/exercise needs.;Develop an individualized exercise prescription for aerobic and resistive training based on initial evaluation findings, risk stratification, comorbidities and participant's personal goals. Provide advice, education, support and counseling about physical activity/exercise  needs.;Develop an individualized exercise prescription for aerobic and resistive training based on initial evaluation findings, risk stratification, comorbidities and participant's personal goals. Provide advice, education, support and counseling about physical activity/exercise needs.;Develop an individualized exercise prescription for aerobic and resistive training based on initial evaluation findings, risk stratification, comorbidities and participant's personal goals.     Expected Outcomes Short Term: Perform resistance training exercises routinely during rehab and add in resistance training at home;Short Term: Increase workloads from initial exercise prescription for resistance, speed, and METs.;Long Term: Improve cardiorespiratory fitness, muscular endurance and strength as measured by increased METs and functional capacity (6MWT) Short Term: Perform resistance training exercises routinely during rehab and add in resistance training at home;Short Term: Increase workloads from initial exercise prescription for resistance, speed, and METs.;Long Term: Improve cardiorespiratory fitness, muscular endurance and strength as measured by increased METs and functional capacity (6MWT) Short Term: Perform resistance training exercises routinely during rehab and add in resistance training at home;Short Term: Increase workloads from initial exercise prescription for resistance, speed, and METs.;Long Term: Improve cardiorespiratory fitness, muscular endurance and strength as measured by increased METs and functional capacity (6MWT)     Able to understand and use rate of perceived exertion (RPE) scale Yes Yes Yes     Intervention Provide education and explanation on how to use RPE scale Provide education and explanation on how to use RPE scale Provide education and explanation on how to use RPE scale     Expected Outcomes Short Term: Able to use RPE daily in rehab to express subjective intensity level;Long Term:  Able to  use RPE to guide intensity level when exercising independently Short Term: Able to use RPE daily in rehab to express subjective intensity level;Long Term:  Able to use RPE to guide intensity level when exercising independently Short Term: Able to use RPE daily in rehab to express subjective intensity level;Long Term:  Able to use RPE to guide intensity level when exercising independently     Knowledge and understanding of Target Heart Rate Range (THRR) Yes Yes Yes     Intervention Provide education and explanation of THRR including how the numbers were predicted and where they are located for reference Provide education and explanation of THRR including how the numbers were predicted and where they are located for reference Provide education and explanation of THRR including how the numbers were predicted and where they are located for reference     Expected Outcomes Short Term: Able to state/look up THRR;Long Term: Able to use THRR to govern intensity when exercising independently;Short Term: Able to use daily as guideline for intensity in rehab Short Term: Able to state/look up THRR;Long Term: Able to use THRR to govern intensity when exercising independently;Short Term: Able to use daily as guideline for intensity in rehab Short Term: Able to state/look up THRR;Long Term: Able to use THRR to govern intensity when exercising independently;Short Term: Able to use daily as guideline for intensity in rehab  Able to check pulse independently Yes Yes Yes     Intervention Provide education and demonstration on how to check pulse in carotid and radial arteries.;Review the importance of being able to check your own pulse for safety during independent exercise Provide education and demonstration on how to check pulse in carotid and radial arteries.;Review the importance of being able to check your own pulse for safety during independent exercise Provide education and demonstration on how to check pulse in carotid  and radial arteries.;Review the importance of being able to check your own pulse for safety during independent exercise     Expected Outcomes Short Term: Able to explain why pulse checking is important during independent exercise;Long Term: Able to check pulse independently and accurately Short Term: Able to explain why pulse checking is important during independent exercise;Long Term: Able to check pulse independently and accurately Short Term: Able to explain why pulse checking is important during independent exercise;Long Term: Able to check pulse independently and accurately     Understanding of Exercise Prescription Yes Yes Yes     Intervention Provide education, explanation, and written materials on patient's individual exercise prescription Provide education, explanation, and written materials on patient's individual exercise prescription Provide education, explanation, and written materials on patient's individual exercise prescription     Expected Outcomes Short Term: Able to explain program exercise prescription;Long Term: Able to explain home exercise prescription to exercise independently Short Term: Able to explain program exercise prescription;Long Term: Able to explain home exercise prescription to exercise independently Short Term: Able to explain program exercise prescription;Long Term: Able to explain home exercise prescription to exercise independently              Exercise Goals Re-Evaluation :  Exercise Goals Re-Evaluation     [place] Name 09/24/21 1046 10/22/21 1359           Exercise Goal Re-Evaluation   Exercise Goals Review Increase Physical Activity;Increase Strength and Stamina;Able to understand and use rate of perceived exertion (RPE) scale;Knowledge and understanding of Target Heart Rate Range (THRR);Able to check pulse independently;Understanding of Exercise Prescription Increase Strength and Stamina;Increase Physical Activity;Able to understand and use rate of  perceived exertion (RPE) scale;Knowledge and understanding of Target Heart Rate Range (THRR);Able to check pulse independently;Understanding of Exercise Prescription      Comments Pt has completed her first session of cardiac rehab. She is very motivated to get back to riding horses and playing golf. She is currently exercising at 3.29 METs on the stepper. Will continue to montior and progress as able. Pt has completed 10 sessions of cardiac rehab. She continues to be motivated during class and is increasing her workload. She is exercising outside of rehab on her own. She is currently exercising at 4.12 METs on the treadmill. Will continue to monitor and progress as able.      Expected Outcomes Through exercise at rehab and at home, the patient will meet their stated goals. Through exercise at rehab and at home, the patient will meet their stated goals.                Discharge Exercise Prescription (Final Exercise Prescription Changes):  Exercise Prescription Changes - 10/22/21 1300       Response to Exercise   Blood Pressure (Admit) 108/64    Blood Pressure (Exercise) 122/52    Blood Pressure (Exit) 92/50    Heart Rate (Admit) 63 bpm    Heart Rate (Exercise) 100 bpm    Heart Rate (Exit) 72  bpm    Rating of Perceived Exertion (Exercise) 13    Duration Continue with 30 min of aerobic exercise without signs/symptoms of physical distress.    Intensity THRR unchanged      Progression   Progression Continue to progress workloads to maintain intensity without signs/symptoms of physical distress.      Resistance Training   Training Prescription Yes    Weight 4    Reps 10-15    Time 10 Minutes      Treadmill   MPH 3    Grade 2    Minutes 17    METs 4.12      NuStep   Level 1    SPM 107    Minutes 22    METs 3.65             Nutrition:  Target Goals: Understanding of nutrition guidelines, daily intake of sodium 1500mg , cholesterol 200mg , calories 30% from fat and 7% or  less from saturated fats, daily to have 5 or more servings of fruits and vegetables.  Biometrics:  Pre Biometrics - 09/18/21 0937       Pre Biometrics   Height 5\' 3"  (1.6 m)    Weight 84.3 kg    Waist Circumference 40.5 inches    Hip Circumference 45 inches    Waist to Hip Ratio 0.9 %    BMI (Calculated) 32.93    Triceps Skinfold 28 mm    % Body Fat 44.3 %    Grip Strength 17.7 kg    Flexibility 6 in    Single Leg Stand 26 seconds              Nutrition Therapy Plan and Nutrition Goals:  Nutrition Therapy & Goals - 09/19/21 1337       Personal Nutrition Goals   Comments Pt scored on her diet assessment.  Paient request deitary consult to help controlilng diet intake. We offer two educational handouts during CR sessions.             Nutrition Assessments:  Nutrition Assessments - 09/18/21 0843       MEDFICTS Scores   Pre Score 35            MEDIFICTS Score Key: ?70 Need to make dietary changes  40-70 Heart Healthy Diet ? 40 Therapeutic Level Cholesterol Diet   Picture Your Plate Scores: [id number] Unhealthy dietary pattern with much room for improvement. 41-50 Dietary pattern unlikely to meet recommendations for good health and room for improvement. 51-60 More healthful dietary pattern, with some room for improvement.  >60 Healthy dietary pattern, although there may be some specific behaviors that could be improved.    Nutrition Goals Re-Evaluation:   Nutrition Goals Discharge (Final Nutrition Goals Re-Evaluation):   Psychosocial: Target Goals: Acknowledge presence or absence of significant depression and/or stress, maximize coping skills, provide positive support system. Participant is able to verbalize types and ability to use techniques and skills needed for reducing stress and depression.  Initial Review & Psychosocial Screening:  Initial Psych Review & Screening - 09/18/21 0840       Initial Review   Current issues with Current Anxiety/Panic       Family Dynamics   Good Support System? Yes    Comments She has 3 nieces and several friends who are her support system.      Barriers   Psychosocial barriers to participate in program There are no identifiable barriers or psychosocial needs.      Screening  Interventions   Interventions Encouraged to exercise    Expected Outcomes Long Term goal: The participant improves quality of Life and PHQ9 Scores as seen by post scores and/or verbalization of changes;Short Term goal: Identification and review with participant of any Quality of Life or Depression concerns found by scoring the questionnaire.             Quality of Life Scores:  Quality of Life - 09/18/21 0938       Quality of Life   Select Quality of Life      Quality of Life Scores   Health/Function Pre 25.71 %    Socioeconomic Pre 30 %    Psych/Spiritual Pre 25.64 %    Family Pre 12 %    GLOBAL Pre 26.13 %            Scores of 19 and below usually indicate a poorer quality of life in these areas.  A difference of  2-3 points is a clinically meaningful difference.  A difference of 2-3 points in the total score of the Quality of Life Index has been associated with significant improvement in overall quality of life, self-image, physical symptoms, and general health in studies assessing change in quality of life.  PHQ-9: Review Flowsheet        09/18/2021  Depression screen PHQ 2/9  Decreased Interest 0  Down, Depressed, Hopeless 0  PHQ - 2 Score 0  Altered sleeping 0  Tired, decreased energy 1  Change in appetite 2  Feeling bad or failure about yourself  0  Trouble concentrating 0  Moving slowly or fidgety/restless 0  Suicidal thoughts 0  PHQ-9 Score 3  Difficult doing work/chores Not difficult at all         Interpretation of Total Score  Total Score Depression Severity:  1-4 = Minimal depression, 5-9 = Mild depression, 10-14 = Moderate depression, 15-19 = Moderately severe depression, 20-27 =  Severe depression   Psychosocial Evaluation and Intervention:  Psychosocial Evaluation - 09/18/21 0951       Psychosocial Evaluation & Interventions   Interventions Stress management education;Relaxation education;Encouraged to exercise with the program and follow exercise prescription    Comments Pt has no barriers to participating in CR. She is treated for anxiety with escitalopram 5 mg daily. Her anxiety began in 2020 when she went into atrial fibrillation for the first time. This was a stressful time for her, and she reports that the medication helped tremendously. She recently had another bout with atrial fibrillation and was able to handle it well mentally. She has no other identifiable psychosocial issues. 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 She also travels frequently to horse shows. She scored a 3 on her PHQ-9 and this relates the her lack of stamina since her stent placement and her overeating. She reports that she knows what foods to eat for a heart healthy diet, but she knows that her portion sizes are too big. She has requested to meet with a dietician for this. She reports that she has a good support system with her 3 nieces and her large group of friends. Her goals for the program are to lose weight and to improve her strength, stamina, balance, and flexibility. She is already taking Ozempic injections weekly to help her with weight loss. She would like to be able to resume her normal horse back riding like before her stent. She is able to ride some now, but has  to take more breaks than she is used to. She is excited to start the program.    Expected Outcomes Pt's anxiety will continue to be treated and she will have no other identifiable psychosocial issues.    Continue Psychosocial Services  No Follow up required             Psychosocial Re-Evaluation:  Psychosocial Re-Evaluation     [place] Name 09/19/21 1329 10/15/21 0916            Psychosocial Re-Evaluation   Current issues with Current Anxiety/Panic Current Anxiety/Panic      Comments Pt is new to the program, starting on 09/24/21.  Will continue to monitor. Patient has completed 6 sessions and continues to have no psychosocial barriers identified. She seems to enjoy coming to the sessions and demonstrates an interest in improving her health. Her anxiety continues to be treated with Escitalopram and she feels it is controlled. We will continue to monitor her progress.      Expected Outcomes -- Patient will continue to have no psychosocial barriers identified and her anxiety will continue to be managed.      Interventions Relaxation education;Stress management education Relaxation education;Stress management education      Continue Psychosocial Services  No Follow up required No Follow up required               Psychosocial Discharge (Final Psychosocial Re-Evaluation):  Psychosocial Re-Evaluation - 10/15/21 0916       Psychosocial Re-Evaluation   Current issues with Current Anxiety/Panic    Comments Patient has completed 6 sessions and continues to have no psychosocial barriers identified. She seems to enjoy coming to the sessions and demonstrates an interest in improving her health. Her anxiety continues to be treated with Escitalopram and she feels it is controlled. We will continue to monitor her progress.    Expected Outcomes Patient will continue to have no psychosocial barriers identified and her anxiety will continue to be managed.    Interventions Relaxation education;Stress management education    Continue Psychosocial Services  No Follow up required             Vocational Rehabilitation: Provide vocational rehab assistance to qualifying candidates.   Vocational Rehab Evaluation & Intervention:  Vocational Rehab - 09/18/21 0848       Initial Vocational Rehab Evaluation & Intervention   Assessment shows need for Vocational Rehabilitation No    Retired and does not want to go back to work            Education: Education Goals: Education classes will be provided on a weekly basis, covering required topics. Participant will state understanding/return demonstration of topics presented.  Learning Barriers/Preferences:   Education Topics: Hypertension, Hypertension Reduction -Define heart disease and high blood pressure. Discus how high blood pressure affects the body and ways to reduce high blood pressure.   Exercise and Your Heart -Discuss why it is important to exercise, the FITT principles of exercise, normal and abnormal responses to exercise, and how to exercise safely.   Angina -Discuss definition of angina, causes of angina, treatment of angina, and how to decrease risk of having angina.   Cardiac Medications -Review what the following cardiac medications are used for, how they affect the body, and side effects that may occur when taking the medications.  Medications include Aspirin, Beta blockers, calcium channel blockers, ACE Inhibitors, angiotensin receptor blockers, diuretics, digoxin, and antihyperlipidemics. Flowsheet Row CARDIAC REHAB PHASE II EXERCISE from 10/17/2021 in [place]  CARDIAC REHABILITATION  Date 09/26/21  Educator DF  Instruction Review Code 1- Verbalizes Understanding       Congestive Heart Failure -Discuss the definition of CHF, how to live with CHF, the signs and symptoms of CHF, and how keep track of weight and sodium intake. Flowsheet Row CARDIAC REHAB PHASE II EXERCISE from 10/17/2021 in [place]  Date 10/03/21  Educator PB  Instruction Review Code 1- Verbalizes Understanding       Heart Disease and Intimacy -Discus the effect sexual activity has on the heart, how changes occur during intimacy as we age, and safety during sexual activity.   Smoking Cessation / COPD -Discuss different methods to quit smoking, the health benefits of quitting smoking, and  the definition of COPD. Flowsheet Row CARDIAC REHAB PHASE II EXERCISE from 10/17/2021 in [place]  Date 10/17/21  Educator pb  Instruction Review Code 1- Verbalizes Understanding       Nutrition I: Fats -Discuss the types of cholesterol, what cholesterol does to the heart, and how cholesterol levels can be controlled.   Nutrition II: Labels -Discuss the different components of food labels and how to read food label   Heart Parts/Heart Disease and PAD -Discuss the anatomy of the heart, the pathway of blood circulation through the heart, and these are affected by heart disease.   Stress I: Signs and Symptoms -Discuss the causes of stress, how stress may lead to anxiety and depression, and ways to limit stress.   Stress II: Relaxation -Discuss different types of relaxation techniques to limit stress.   Warning Signs of Stroke / TIA -Discuss definition of a stroke, what the signs and symptoms are of a stroke, and how to identify when someone is having stroke.   Knowledge Questionnaire Score:  Knowledge Questionnaire Score - 09/18/21 0845       Knowledge Questionnaire Score   Pre Score 20/24             Core Components/Risk Factors/Patient Goals at Admission:  Personal Goals and Risk Factors at Admission - 09/18/21 0849       Core Components/Risk Factors/Patient Goals on Admission    Weight Management Yes;Weight Loss;Weight Maintenance    Intervention Obesity: Provide education and appropriate resources to help participant work on and attain dietary goals.;Weight Management/Obesity: Establish reasonable short term and long term weight goals.;Weight Management: Provide education and appropriate resources to help participant work on and attain dietary goals.;Weight Management: Develop a combined nutrition and exercise program designed to reach desired caloric intake, while maintaining appropriate intake of nutrient and fiber, sodium and fats, and  appropriate energy expenditure required for the weight goal.    Expected Outcomes Short Term: Continue to assess and modify interventions until short term weight is achieved;Weight Maintenance: Understanding of the daily nutrition guidelines, which includes 25-35% calories from fat, 7% or less cal from saturated fats, less than 200mg  cholesterol, less than 1.5gm of sodium, & 5 or more servings of fruits and vegetables daily;Long Term: Adherence to nutrition and physical activity/exercise program aimed toward attainment of established weight goal;Weight Loss: Understanding of general recommendations for a balanced deficit meal plan, which promotes 1-2 lb weight loss per week and includes a negative energy balance of [phone] kcal/d;Understanding recommendations for meals to include 15-35% energy as protein, 25-35% energy from fat, 35-60% energy from carbohydrates, less than 200mg  of dietary cholesterol, 20-35 gm of total fiber daily;Understanding of distribution of calorie intake throughout the day with the consumption of 4-5 meals/snacks  Hypertension Yes    Intervention Provide education on lifestyle modifcations including regular physical activity/exercise, weight management, moderate sodium restriction and increased consumption of fresh fruit, vegetables, and low fat dairy, alcohol moderation, and smoking cessation.;Monitor prescription use compliance.    Expected Outcomes Short Term: Continued assessment and intervention until BP is < 140/30mm HG in hypertensive participants. < 130/13mm HG in hypertensive participants with diabetes, heart failure or chronic kidney disease.;Long Term: Maintenance of blood pressure at goal levels.    Personal Goal Other Yes    Personal Goal Improve strength, stamina, balance, and flexibility    Intervention Attend CR three days per week and continue with her home exercise program.    Expected Outcomes Pt will be able to improve her strength, stamina, balance, and  flexibility, and this will improve her horse back riding skills.             Core Components/Risk Factors/Patient Goals Review:   Goals and Risk Factor Review     Row Name 09/19/21 1331 10/15/21 0919           Core Components/Risk Factors/Patient Goals Review   Personal Goals Review Weight Management/Obesity;Hypertension;Other Weight Management/Obesity;Hypertension;Other      Review Pt refered to CR for Cardiac stent.  Pt plans to start on Monday 04/24.  Pt has multiple risk factors for CAD. She is participating in the program for risk motification.  Her personal goals for the programto increase stamina, balance and flexibility, loss weight and eat healthier.  We will work with her to meet her goals. Patient has completed 6 sessions with her current weight at 184.1 down 2 lbs from last 30 day review. She is doing well in the program with progressions and consistent attendance. 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 Her blood pressue is well controlled. Her personal goals for the program continue to be to increase her strength, stamina, balance, and flexibility; lose weight; improve her diet and be able to get back to playing golf and riding horses again. We will continue to monitor her progress as she works towards meeting these goals.      Expected Outcomes Pt will complete the program and meet personal and program goals. Pt will complete the program and meet personal and program goals.               Core Components/Risk Factors/Patient Goals at Discharge (Final Review):   Goals and Risk Factor Review - 10/15/21 0919       Core Components/Risk Factors/Patient Goals Review   Personal Goals Review Weight Management/Obesity;Hypertension;Other    Review Patient has completed 6 sessions with her current weight at 184.1 down 2 lbs from last 30 day review. She is doing well in the program with progressions and consistent attendance. She has missed several sessions due  to being a a horse show. Her blood pressue is well controlled. Her personal goals for the program continue to be to increase her strength, stamina, balance, and flexibility; lose weight; improve her diet and be able to get back to playing golf and riding horses again. We will continue to monitor her progress as she works towards meeting these goals.    Expected Outcomes Pt will complete the program and meet personal and program goals.             ITP Comments:   Comments: ITP REVIEW Pt is making expected progress toward Cardiac Rehab goals after completing 10 sessions. Recommend continued exercise, life style modification,  education, and increased stamina and strength.

## 2021-10-26 ENCOUNTER — Encounter (HOSPITAL_COMMUNITY)
Admission: RE | Admit: 2021-10-26 | Discharge: 2021-10-26 | Disposition: A | Discharge status: Home / Self Care | Payer: Medicare Other | Source: Ambulatory Visit | Attending: Cardiology | Admitting: Cardiology

## 2021-10-26 DIAGNOSIS — Z955 Presence of coronary angioplasty implant and graft: Secondary | ICD-10-CM

## 2021-10-26 NOTE — Progress Notes (Signed)
Daily Session Note  Patient Details  Name: Colleen Cole MRN: [id number] Date of Birth: 03/30/1946 Referring Provider:   Flowsheet Row CARDIAC REHAB PHASE II ORIENTATION from 09/18/2021 in [place]  Referring Provider Dr. Burt Knack       Encounter Date: 10/26/2021  Check In:  Session Check In - 10/26/21 0926       Check-In   Supervising physician immediately available to respond to emergencies [facility] MD immediately available    Physician(s) Dr Debara Pickett    Location [facility]    Staff Present Redge Gainer, BS, Exercise Physiologist;Christy Oletta Lamas, RN, Joanette Gula, RN, BSN    Virtual Visit No    Medication changes reported     No    Fall or balance concerns reported    No    Tobacco Cessation No Change    Warm-up and Cool-down Performed as group-led instruction    Resistance Training Performed Yes    VAD Patient? No    PAD/SET Patient? No      Pain Assessment   Currently in Pain? No/denies    Multiple Pain Sites No             Capillary Blood Glucose: No results found for this or any previous visit (from the past 24 hour(s)).    Social History   Tobacco Use  Smoking Status Former   Types: Cigarettes   Quit date: 07/13/1998   Years since quitting: 23.3  Smokeless Tobacco Never    Goals Met:  Independence with exercise equipment Exercise tolerated well No report of concerns or symptoms today Strength training completed today  Goals Unmet:  Not Applicable  Comments: Checkout at 1030.   Dr. Carlyle Dolly is Medical Director for [facility] Cardiac [facility]

## 2021-10-29 ENCOUNTER — Encounter (HOSPITAL_COMMUNITY): Payer: Medicare Other

## 2021-10-31 ENCOUNTER — Encounter (HOSPITAL_COMMUNITY)
Admission: RE | Admit: 2021-10-31 | Discharge: 2021-10-31 | Disposition: A | Discharge status: Home / Self Care | Payer: Medicare Other | Source: Ambulatory Visit | Attending: Cardiology | Admitting: Cardiology

## 2021-10-31 DIAGNOSIS — Z955 Presence of coronary angioplasty implant and graft: Secondary | ICD-10-CM | POA: Diagnosis not present

## 2021-10-31 NOTE — Progress Notes (Signed)
Daily Session Note  Patient Details  Name: Colleen Cole MRN: [id number] Date of Birth: 03/18/1946 Referring Provider:   Flowsheet Row CARDIAC REHAB PHASE II ORIENTATION from 09/18/2021 in [place]  Referring Provider Dr. Burt Knack       Encounter Date: 10/31/2021  Check In:  Session Check In - 10/31/21 0930       Check-In   Supervising physician immediately available to respond to emergencies [facility] MD immediately available    Physician(s) Dr Harl Bowie    Location [facility]    Staff Present Dangela How Hassell Done, RN, Bjorn Loser, MS, ACSM-CEP, Exercise Physiologist    Virtual Visit No    Medication changes reported     No    Fall or balance concerns reported    No    Tobacco Cessation No Change    Warm-up and Cool-down Performed as group-led instruction    Resistance Training Performed Yes    VAD Patient? No    PAD/SET Patient? No      Pain Assessment   Currently in Pain? No/denies    Multiple Pain Sites No             Capillary Blood Glucose: No results found for this or any previous visit (from the past 24 hour(s)).    Social History   Tobacco Use  Smoking Status Former   Types: Cigarettes   Quit date: 07/13/1998   Years since quitting: 23.3  Smokeless Tobacco Never    Goals Met:  Independence with exercise equipment Exercise tolerated well No report of concerns or symptoms today Strength training completed today  Goals Unmet:  Not Applicable  Comments: Checkout at 1030.   Dr. Carlyle Dolly is Medical Director for [facility] Cardiac Rehab

## 2021-11-02 ENCOUNTER — Encounter (HOSPITAL_COMMUNITY)
Admission: RE | Admit: 2021-11-02 | Discharge: 2021-11-02 | Disposition: A | Discharge status: Home / Self Care | Payer: Medicare Other | Source: Ambulatory Visit | Attending: Cardiology | Admitting: Cardiology

## 2021-11-02 DIAGNOSIS — Z955 Presence of coronary angioplasty implant and graft: Secondary | ICD-10-CM | POA: Diagnosis present

## 2021-11-02 NOTE — Progress Notes (Signed)
Daily Session Note  Patient Details  Name: Colleen Cole MRN: [id number] Date of Birth: 1945-09-23 Referring Provider:   Flowsheet Row CARDIAC REHAB PHASE II ORIENTATION from 09/18/2021 in [place]  Referring Provider Dr. Burt Knack       Encounter Date: 11/02/2021  Check In:  Session Check In - 11/02/21 0930       Check-In   Supervising physician immediately available to respond to emergencies [facility] MD immediately available    Physician(s) Dr Gardiner Rhyme    Location [facility]    Staff Present Launa Goedken Hassell Done, RN, Bjorn Loser, MS, ACSM-CEP, Exercise Physiologist    Virtual Visit No    Medication changes reported     No    Fall or balance concerns reported    No    Tobacco Cessation No Change    Warm-up and Cool-down Performed as group-led instruction    Resistance Training Performed Yes    VAD Patient? No    PAD/SET Patient? No      Pain Assessment   Currently in Pain? No/denies    Multiple Pain Sites No             Capillary Blood Glucose: No results found for this or any previous visit (from the past 24 hour(s)).    Social History   Tobacco Use  Smoking Status Former   Types: Cigarettes   Quit date: 07/13/1998   Years since quitting: 23.3  Smokeless Tobacco Never    Goals Met:  Independence with exercise equipment Exercise tolerated well No report of concerns or symptoms today Strength training completed today  Goals Unmet:  Not Applicable  Comments: checkout at 1030.   Dr. Carlyle Dolly is Medical Director for [facility] Cardiac Re[facility]

## 2021-11-05 ENCOUNTER — Encounter (HOSPITAL_COMMUNITY)
Admission: RE | Admit: 2021-11-05 | Discharge: 2021-11-05 | Disposition: A | Discharge status: Home / Self Care | Payer: Medicare Other | Source: Ambulatory Visit | Attending: Cardiology | Admitting: Cardiology

## 2021-11-05 VITALS — Wt 185.4 lb

## 2021-11-05 DIAGNOSIS — Z955 Presence of coronary angioplasty implant and graft: Secondary | ICD-10-CM | POA: Diagnosis not present

## 2021-11-05 NOTE — Progress Notes (Signed)
Daily Session Note  Patient Details  Name: Colleen Cole MRN: [id number] Date of Birth: 07-Sep-1945 Referring Provider:   Flowsheet Row CARDIAC REHAB PHASE II ORIENTATION from 09/18/2021 in [place]  Referring Provider Dr. Burt Knack       Encounter Date: 11/05/2021  Check In:  Session Check In - 11/05/21 0930       Check-In   Supervising physician immediately available to respond to emergencies [facility] MD immediately available    Physician(s) Dr. Marlou Porch    Location [facility]    Staff Present Redge Gainer, BS, Exercise Physiologist;Debra Wynetta Emery, RN, Bjorn Loser, MS, ACSM-CEP, Exercise Physiologist    Virtual Visit No    Medication changes reported     No    Fall or balance concerns reported    No    Tobacco Cessation No Change    Warm-up and Cool-down Performed as group-led instruction    Resistance Training Performed Yes    VAD Patient? No    PAD/SET Patient? No      Pain Assessment   Currently in Pain? No/denies    Multiple Pain Sites No             Capillary Blood Glucose: No results found for this or any previous visit (from the past 24 hour(s)).    Social History   Tobacco Use  Smoking Status Former   Types: Cigarettes   Quit date: 07/13/1998   Years since quitting: 23.3  Smokeless Tobacco Never    Goals Met:  Independence with exercise equipment Exercise tolerated well No report of concerns or symptoms today Strength training completed today  Goals Unmet:  Not Applicable  Comments: checkout time is 1030   Dr. Carlyle Dolly is Medical Director for [place]

## 2021-11-07 ENCOUNTER — Encounter (HOSPITAL_COMMUNITY)
Admission: RE | Admit: 2021-11-07 | Discharge: 2021-11-07 | Disposition: A | Discharge status: Home / Self Care | Payer: Medicare Other | Source: Ambulatory Visit | Attending: Cardiology | Admitting: Cardiology

## 2021-11-07 DIAGNOSIS — Z955 Presence of coronary angioplasty implant and graft: Secondary | ICD-10-CM

## 2021-11-07 NOTE — Progress Notes (Signed)
Daily Session Note  Patient Details  Name: Colleen Cole MRN: [id number] Date of Birth: 05/08/1946 Referring Provider:   Flowsheet Row CARDIAC REHAB PHASE II ORIENTATION from 09/18/2021 in [place]  Referring Provider Dr. Burt Knack       Encounter Date: 11/07/2021  Check In:  Session Check In - 11/07/21 0940       Check-In   Supervising physician immediately available to respond to emergencies [facility] MD immediately available    Physician(s) Dr Johnsie Cancel    Location [facility]    Staff Present Redge Gainer, BS, Exercise Physiologist;Dalton Kris Mouton, MS, ACSM-CEP, Exercise Physiologist;Filimon Miranda Hassell Done, RN, BSN    Virtual Visit No    Medication changes reported     No    Fall or balance concerns reported    No    Tobacco Cessation No Change    Warm-up and Cool-down Performed as group-led instruction    Resistance Training Performed Yes    VAD Patient? No    PAD/SET Patient? No      Pain Assessment   Currently in Pain? No/denies    Multiple Pain Sites No             Capillary Blood Glucose: No results found for this or any previous visit (from the past 24 hour(s)).    Social History   Tobacco Use  Smoking Status Former   Types: Cigarettes   Quit date: 07/13/1998   Years since quitting: 23.3  Smokeless Tobacco Never    Goals Met:  Independence with exercise equipment Exercise tolerated well No report of concerns or symptoms today Strength training completed today  Goals Unmet:  Not Applicable  Comments: checkout at 1030.   Dr. Carlyle Dolly is Medical Director for [facility] Re[facility]

## 2021-11-09 ENCOUNTER — Encounter (HOSPITAL_COMMUNITY): Payer: Medicare Other

## 2021-11-12 ENCOUNTER — Encounter (HOSPITAL_COMMUNITY): Payer: Medicare Other

## 2021-11-14 ENCOUNTER — Encounter (HOSPITAL_COMMUNITY): Payer: Medicare Other

## 2021-11-16 ENCOUNTER — Encounter (HOSPITAL_COMMUNITY): Payer: Medicare Other

## 2021-11-19 ENCOUNTER — Encounter (HOSPITAL_COMMUNITY): Payer: Medicare Other

## 2021-11-21 ENCOUNTER — Encounter (HOSPITAL_COMMUNITY)
Admission: RE | Admit: 2021-11-21 | Discharge: 2021-11-21 | Disposition: A | Discharge status: Home / Self Care | Payer: Medicare Other | Source: Ambulatory Visit | Attending: Cardiology | Admitting: Cardiology

## 2021-11-21 DIAGNOSIS — Z955 Presence of coronary angioplasty implant and graft: Secondary | ICD-10-CM | POA: Diagnosis not present

## 2021-11-21 NOTE — Progress Notes (Signed)
Cardiac Individual Treatment Plan  Patient Details  Name: Colleen Cole MRN: [id number] Date of Birth: 05-Mar-1946 Referring Provider:   Flowsheet Row CARDIAC REHAB PHASE II ORIENTATION from 09/18/2021 in [place]  Referring Provider Dr. Burt Knack       Initial Encounter Date:  Flowsheet Row CARDIAC REHAB PHASE II ORIENTATION from 09/18/2021 in [facility]  Date 09/18/21       Visit Diagnosis: S/P coronary artery stent placement  Patient's Home Medications on Admission:  Current Outpatient Medications:    acetaminophen (TYLENOL) 500 MG tablet, Take 1,000 mg by mouth every 6 (six) hours as needed for mild pain or headache., Disp: , Rfl:    alendronate (FOSAMAX) 70 MG tablet, Take 70 mg by mouth every Sunday., Disp: , Rfl:    amiodarone (PACERONE) 200 MG tablet, Take (1) tablet twice a day for (2) weeks then decrease to (1) tablet daily, Disp: 105 tablet, Rfl: 3   amLODipine (NORVASC) 5 MG tablet, Take 10 mg by mouth daily., Disp: , Rfl:    Calcium Carb-Cholecalciferol (CALCIUM 600 + D PO), Take 1 tablet by mouth 2 (two) times daily., Disp: , Rfl:    cholecalciferol (VITAMIN D3) 25 MCG (1000 UNIT) tablet, Take 1,000 Units by mouth daily., Disp: , Rfl:    clopidogrel (PLAVIX) 75 MG tablet, Take 1 tablet (75 mg total) by mouth daily., Disp: 90 tablet, Rfl: 1   Docusate Calcium (STOOL SOFTENER PO), Take 2 capsules by mouth at bedtime., Disp: , Rfl:    escitalopram (LEXAPRO) 5 MG tablet, Take 5 mg by mouth daily., Disp: , Rfl:    fluticasone (FLONASE) 50 MCG/ACT nasal spray, Place 1 spray into both nostrils daily as needed for allergies or rhinitis., Disp: , Rfl:    hydrALAZINE (APRESOLINE) 25 MG tablet, Take 1 tablet (25 mg total) by mouth 2 (two) times daily. (Patient taking differently: Take 25-50 mg by mouth See admin instructions. Take 50 mg in the morning, 25 mg in the afternoon, and 50 mg at night), Disp: 180 tablet, Rfl: 3    hydrochlorothiazide (HYDRODIURIL) 25 MG tablet, Take 25 mg by mouth daily., Disp: , Rfl:    levocetirizine (XYZAL) 5 MG tablet, Take 5 mg by mouth daily as needed for allergies., Disp: , Rfl:    losartan (COZAAR) 100 MG tablet, Take 100 mg by mouth every evening., Disp: , Rfl:    metoprolol tartrate (LOPRESSOR) 25 MG tablet, TAKE 1 TABLET(25 MG) BY MOUTH TWICE DAILY, Disp: 180 tablet, Rfl: 3   nitroGLYCERIN (NITROSTAT) 0.4 MG SL tablet, Place 1 tablet (0.4 mg total) under the tongue every 5 (five) minutes as needed., Disp: 25 tablet, Rfl: 2   OZEMPIC, 1 MG/DOSE, 4 MG/3ML SOPN, Inject 1 mg into the skin every Thursday., Disp: , Rfl:    rosuvastatin (CRESTOR) 20 MG tablet, Take 1 tablet (20 mg total) by mouth daily., Disp: 90 tablet, Rfl: 3   senna (SENOKOT) 8.6 MG TABS tablet, Take 2 tablets by mouth at bedtime., Disp: , Rfl:    XARELTO 20 MG TABS tablet, Take 1 tablet (20 mg total) by mouth daily., Disp: 90 tablet, Rfl: 3  Past Medical History: Past Medical History:  Diagnosis Date   Hypertension     Tobacco Use: Social History   Tobacco Use  Smoking Status Former   Types: Cigarettes   Quit date: 07/13/1998   Years since quitting: 23.3  Smokeless Tobacco Never    Labs: Review Flowsheet  Latest Ref Rng & Units 01/17/2020 05/17/2020 06/12/2021  Labs for ITP Cardiac and Pulmonary Rehab  Cholestrol 100 - 199 mg/dL 178  136  151   LDL (calc) 0 - 99 mg/dL 97  55  57   HDL-C >39 mg/dL 66  67  81   Trlycerides 0 - 149 mg/dL 83  69  63     Capillary Blood Glucose: Lab Results  Component Value Date   GLUCAP 103 (H) 09/24/2021   GLUCAP 108 (H) 09/18/2021     Exercise Target Goals: Exercise Program Goal: Individual exercise prescription set using results from initial 6 min walk test and THRR while considering  patient's activity barriers and safety.   Exercise Prescription Goal: Starting with aerobic activity 30 plus minutes a day, 3 days per week for initial exercise  prescription. Provide home exercise prescription and guidelines that participant acknowledges understanding prior to discharge.  Activity Barriers & Risk Stratification:  Activity Barriers & Cardiac Risk Stratification - 09/18/21 0837       Activity Barriers & Cardiac Risk Stratification   Activity Barriers Right Hip Replacement;Arthritis;Joint Problems    Cardiac Risk Stratification High             6 Minute Walk:  6 Minute Walk     Row Name 09/18/21 0934         6 Minute Walk   Phase Initial     Distance 1500 feet     Walk Time 6 minutes     # of Rest Breaks 0     MPH 2.84     METS 2.5     RPE 12     VO2 Peak 8.87     Symptoms No     Resting HR 64 bpm     Resting BP 108/68     Resting Oxygen Saturation  95 %     Exercise Oxygen Saturation  during 6 min walk 96 %     Max Ex. HR 77 bpm     Max Ex. BP 136/60     2 Minute Post BP 118/60              Oxygen Initial Assessment:   Oxygen Re-Evaluation:   Oxygen Discharge (Final Oxygen Re-Evaluation):   Initial Exercise Prescription:  Initial Exercise Prescription - 09/18/21 0900       Date of Initial Exercise RX and Referring Provider   Date 09/18/21    Referring Provider Dr. Burt Knack    Expected Discharge Date 12/14/21      Treadmill   MPH 2.5    Grade 0    Minutes 17      NuStep   Level 1    SPM 60    Minutes 22      Prescription Details   Frequency (times per week) 3    Duration Progress to 30 minutes of continuous aerobic without signs/symptoms of physical distress      Intensity   THRR 40-80% of Max Heartrate 59-118    Ratings of Perceived Exertion 11-13      Resistance Training   Training Prescription Yes    Weight 3    Reps 10-15             Perform Capillary Blood Glucose checks as needed.  Exercise Prescription Changes:   Exercise Prescription Changes     Row Name 09/24/21 1000 10/01/21 1000 10/03/21 1025 10/22/21 1300 11/05/21 1500     Response to Exercise    Blood  Pressure (Admit) 128/60 -- 102/50 108/64 112/60   Blood Pressure (Exercise) 128/58 -- 122/80 122/52 110/52   Blood Pressure (Exit) 116/60 -- 108/60 92/50 110/58   Heart Rate (Admit) 67 bpm -- 65 bpm 63 bpm 63 bpm   Heart Rate (Exercise) 79 bpm -- 83 bpm 100 bpm 81 bpm   Heart Rate (Exit) 71 bpm -- 74 bpm 72 bpm 72 bpm   Rating of Perceived Exertion (Exercise) 11 -- 13 13 13    Duration Continue with 30 min of aerobic exercise without signs/symptoms of physical distress. -- Continue with 30 min of aerobic exercise without signs/symptoms of physical distress. Continue with 30 min of aerobic exercise without signs/symptoms of physical distress. Continue with 30 min of aerobic exercise without signs/symptoms of physical distress.   Intensity THRR unchanged -- THRR unchanged THRR unchanged THRR unchanged     Progression   Progression Continue to progress workloads to maintain intensity without signs/symptoms of physical distress. -- Continue to progress workloads to maintain intensity without signs/symptoms of physical distress. Continue to progress workloads to maintain intensity without signs/symptoms of physical distress. Continue to progress workloads to maintain intensity without signs/symptoms of physical distress.     Resistance Training   Training Prescription Yes -- Yes Yes Yes   Weight 3 -- 4 4 5    Reps 10-15 -- 10-15 10-15 10-15   Time 10 Minutes -- 10 Minutes 10 Minutes 10 Minutes     Treadmill   MPH 2.5 -- 2.7 3 3    Grade 0 -- 0.5 2 2    Minutes 17 -- 17 17 17    METs 2.91 -- 3.25 4.12 4.12     NuStep   Level 1 -- 1 1 2    SPM 114 -- 122 107 135   Minutes 22 -- 22 22 22    METs 3.29 -- 3.37 3.65 3.93     Home Exercise Plan   Plans to continue exercise at -- Home (comment) -- -- --   Frequency -- Add 2 additional days to program exercise sessions. -- -- --   Initial Home Exercises Provided -- 10/01/21 -- -- --    [place] Name 11/07/21 0930             Response to  Exercise   Blood Pressure (Admit) 114/64       Blood Pressure (Exercise) 124/60       Blood Pressure (Exit) 110/54       Heart Rate (Admit) 64 bpm       Heart Rate (Exercise) 91 bpm       Heart Rate (Exit) 73 bpm       Rating of Perceived Exertion (Exercise) 14       Duration Continue with 30 min of aerobic exercise without signs/symptoms of physical distress.       Intensity THRR unchanged         Progression   Progression Continue to progress workloads to maintain intensity without signs/symptoms of physical distress.         Resistance Training   Training Prescription Yes       Weight 5       Reps 10-15       Time 10 Minutes         Treadmill   MPH 3       Grade 2       Minutes 17       METs 4.12         NuStep   Level  2       SPM 128       Minutes 22       METs 3.83                Exercise Comments:   Exercise Goals and Review:   Exercise Goals     Row Name 09/18/21 0936 09/24/21 1046 10/22/21 1357 11/19/21 1116       Exercise Goals   Increase Physical Activity Yes Yes Yes Yes    Intervention Provide advice, education, support and counseling about physical activity/exercise needs.;Develop an individualized exercise prescription for aerobic and resistive training based on initial evaluation findings, risk stratification, comorbidities and participant's personal goals. Provide advice, education, support and counseling about physical activity/exercise needs.;Develop an individualized exercise prescription for aerobic and resistive training based on initial evaluation findings, risk stratification, comorbidities and participant's personal goals. Provide advice, education, support and counseling about physical activity/exercise needs.;Develop an individualized exercise prescription for aerobic and resistive training based on initial evaluation findings, risk stratification, comorbidities and participant's personal goals. Provide advice, education, support and  counseling about physical activity/exercise needs.;Develop an individualized exercise prescription for aerobic and resistive training based on initial evaluation findings, risk stratification, comorbidities and participant's personal goals.    Expected Outcomes Short Term: Attend rehab on a regular basis to increase amount of physical activity.;Long Term: Add in home exercise to make exercise part of routine and to increase amount of physical activity.;Long Term: Exercising regularly at least 3-5 days a week. Short Term: Attend rehab on a regular basis to increase amount of physical activity.;Long Term: Add in home exercise to make exercise part of routine and to increase amount of physical activity.;Long Term: Exercising regularly at least 3-5 days a week. Short Term: Attend rehab on a regular basis to increase amount of physical activity.;Long Term: Add in home exercise to make exercise part of routine and to increase amount of physical activity.;Long Term: Exercising regularly at least 3-5 days a week. Short Term: Attend rehab on a regular basis to increase amount of physical activity.;Long Term: Add in home exercise to make exercise part of routine and to increase amount of physical activity.;Long Term: Exercising regularly at least 3-5 days a week.    Increase Strength and Stamina Yes Yes Yes Yes    Intervention Provide advice, education, support and counseling about physical activity/exercise needs.;Develop an individualized exercise prescription for aerobic and resistive training based on initial evaluation findings, risk stratification, comorbidities and participant's personal goals. Provide advice, education, support and counseling about physical activity/exercise needs.;Develop an individualized exercise prescription for aerobic and resistive training based on initial evaluation findings, risk stratification, comorbidities and participant's personal goals. Provide advice, education, support and  counseling about physical activity/exercise needs.;Develop an individualized exercise prescription for aerobic and resistive training based on initial evaluation findings, risk stratification, comorbidities and participant's personal goals. Provide advice, education, support and counseling about physical activity/exercise needs.;Develop an individualized exercise prescription for aerobic and resistive training based on initial evaluation findings, risk stratification, comorbidities and participant's personal goals.    Expected Outcomes Short Term: Perform resistance training exercises routinely during rehab and add in resistance training at home;Short Term: Increase workloads from initial exercise prescription for resistance, speed, and METs.;Long Term: Improve cardiorespiratory fitness, muscular endurance and strength as measured by increased METs and functional capacity (6MWT) Short Term: Perform resistance training exercises routinely during rehab and add in resistance training at home;Short Term: Increase workloads from initial exercise prescription for resistance, speed, and METs.;Long Term:  Improve cardiorespiratory fitness, muscular endurance and strength as measured by increased METs and functional capacity (6MWT) Short Term: Perform resistance training exercises routinely during rehab and add in resistance training at home;Short Term: Increase workloads from initial exercise prescription for resistance, speed, and METs.;Long Term: Improve cardiorespiratory fitness, muscular endurance and strength as measured by increased METs and functional capacity (6MWT) Short Term: Perform resistance training exercises routinely during rehab and add in resistance training at home;Short Term: Increase workloads from initial exercise prescription for resistance, speed, and METs.;Long Term: Improve cardiorespiratory fitness, muscular endurance and strength as measured by increased METs and functional capacity (6MWT)     Able to understand and use rate of perceived exertion (RPE) scale Yes Yes Yes Yes    Intervention Provide education and explanation on how to use RPE scale Provide education and explanation on how to use RPE scale Provide education and explanation on how to use RPE scale Provide education and explanation on how to use RPE scale    Expected Outcomes Short Term: Able to use RPE daily in rehab to express subjective intensity level;Long Term:  Able to use RPE to guide intensity level when exercising independently Short Term: Able to use RPE daily in rehab to express subjective intensity level;Long Term:  Able to use RPE to guide intensity level when exercising independently Short Term: Able to use RPE daily in rehab to express subjective intensity level;Long Term:  Able to use RPE to guide intensity level when exercising independently Short Term: Able to use RPE daily in rehab to express subjective intensity level;Long Term:  Able to use RPE to guide intensity level when exercising independently    Knowledge and understanding of Target Heart Rate Range (THRR) Yes Yes Yes Yes    Intervention Provide education and explanation of THRR including how the numbers were predicted and where they are located for reference Provide education and explanation of THRR including how the numbers were predicted and where they are located for reference Provide education and explanation of THRR including how the numbers were predicted and where they are located for reference Provide education and explanation of THRR including how the numbers were predicted and where they are located for reference    Expected Outcomes Short Term: Able to state/look up THRR;Long Term: Able to use THRR to govern intensity when exercising independently;Short Term: Able to use daily as guideline for intensity in rehab Short Term: Able to state/look up THRR;Long Term: Able to use THRR to govern intensity when exercising independently;Short Term: Able to use  daily as guideline for intensity in rehab Short Term: Able to state/look up THRR;Long Term: Able to use THRR to govern intensity when exercising independently;Short Term: Able to use daily as guideline for intensity in rehab Short Term: Able to state/look up THRR;Long Term: Able to use THRR to govern intensity when exercising independently;Short Term: Able to use daily as guideline for intensity in rehab    Able to check pulse independently Yes Yes Yes Yes    Intervention Provide education and demonstration on how to check pulse in carotid and radial arteries.;Review the importance of being able to check your own pulse for safety during independent exercise Provide education and demonstration on how to check pulse in carotid and radial arteries.;Review the importance of being able to check your own pulse for safety during independent exercise Provide education and demonstration on how to check pulse in carotid and radial arteries.;Review the importance of being able to check your own pulse for safety  during independent exercise Provide education and demonstration on how to check pulse in carotid and radial arteries.;Review the importance of being able to check your own pulse for safety during independent exercise    Expected Outcomes Short Term: Able to explain why pulse checking is important during independent exercise;Long Term: Able to check pulse independently and accurately Short Term: Able to explain why pulse checking is important during independent exercise;Long Term: Able to check pulse independently and accurately Short Term: Able to explain why pulse checking is important during independent exercise;Long Term: Able to check pulse independently and accurately Short Term: Able to explain why pulse checking is important during independent exercise;Long Term: Able to check pulse independently and accurately    Understanding of Exercise Prescription Yes Yes Yes Yes    Intervention Provide education,  explanation, and written materials on patient's individual exercise prescription Provide education, explanation, and written materials on patient's individual exercise prescription Provide education, explanation, and written materials on patient's individual exercise prescription Provide education, explanation, and written materials on patient's individual exercise prescription    Expected Outcomes Short Term: Able to explain program exercise prescription;Long Term: Able to explain home exercise prescription to exercise independently Short Term: Able to explain program exercise prescription;Long Term: Able to explain home exercise prescription to exercise independently Short Term: Able to explain program exercise prescription;Long Term: Able to explain home exercise prescription to exercise independently Short Term: Able to explain program exercise prescription;Long Term: Able to explain home exercise prescription to exercise independently             Exercise Goals Re-Evaluation :  Exercise Goals Re-Evaluation     Row Name 09/24/21 1046 10/22/21 1359 11/19/21 1116         Exercise Goal Re-Evaluation   Exercise Goals Review Increase Physical Activity;Increase Strength and Stamina;Able to understand and use rate of perceived exertion (RPE) scale;Knowledge and understanding of Target Heart Rate Range (THRR);Able to check pulse independently;Understanding of Exercise Prescription Increase Strength and Stamina;Increase Physical Activity;Able to understand and use rate of perceived exertion (RPE) scale;Knowledge and understanding of Target Heart Rate Range (THRR);Able to check pulse independently;Understanding of Exercise Prescription Increase Strength and Stamina;Increase Physical Activity;Able to understand and use rate of perceived exertion (RPE) scale;Knowledge and understanding of Target Heart Rate Range (THRR);Able to check pulse independently;Understanding of Exercise Prescription     Comments Pt  has completed her first session of cardiac rehab. She is very motivated to get back to riding horses and playing golf. She is currently exercising at 3.29 METs on the stepper. Will continue to montior and progress as able. Pt has completed 10 sessions of cardiac rehab. She continues to be motivated during class and is increasing her workload. She is exercising outside of rehab on her own. She is currently exercising at 4.12 METs on the treadmill. Will continue to monitor and progress as able. Pt has completed 16 sessions of cardiac rehab. She continues to be motivated during class and continues to increase her workloads. She is active outside of rehab and exercises multiple days per week. She has been on vacation during the last 5 sessions, but she is set to return on 6/21. She is currently exercising at 4.12 METs on the treadmill. Will continue to monitor and progress as able.     Expected Outcomes Through exercise at rehab and at home, the patient will meet their stated goals. Through exercise at rehab and at home, the patient will meet their stated goals. Through exercise at rehab and  at home, the patient will meet their stated goals.               Discharge Exercise Prescription (Final Exercise Prescription Changes):  Exercise Prescription Changes - 11/07/21 0930       Response to Exercise   Blood Pressure (Admit) 114/64    Blood Pressure (Exercise) 124/60    Blood Pressure (Exit) 110/54    Heart Rate (Admit) 64 bpm    Heart Rate (Exercise) 91 bpm    Heart Rate (Exit) 73 bpm    Rating of Perceived Exertion (Exercise) 14    Duration Continue with 30 min of aerobic exercise without signs/symptoms of physical distress.    Intensity THRR unchanged      Progression   Progression Continue to progress workloads to maintain intensity without signs/symptoms of physical distress.      Resistance Training   Training Prescription Yes    Weight 5    Reps 10-15    Time 10 Minutes       Treadmill   MPH 3    Grade 2    Minutes 17    METs 4.12      NuStep   Level 2    SPM 128    Minutes 22    METs 3.83             Nutrition:  Target Goals: Understanding of nutrition guidelines, daily intake of sodium 1500mg , cholesterol 200mg , calories 30% from fat and 7% or less from saturated fats, daily to have 5 or more servings of fruits and vegetables.  Biometrics:  Pre Biometrics - 09/18/21 0937       Pre Biometrics   Height 5\' 3"  (1.6 m)    Weight 84.3 kg    Waist Circumference 40.5 inches    Hip Circumference 45 inches    Waist to Hip Ratio 0.9 %    BMI (Calculated) 32.93    Triceps Skinfold 28 mm    % Body Fat 44.3 %    Grip Strength 17.7 kg    Flexibility 6 in    Single Leg Stand 26 seconds              Nutrition Therapy Plan and Nutrition Goals:  Nutrition Therapy & Goals - 09/19/21 1337       Personal Nutrition Goals   Comments Pt scored on her diet assessment.  Paient request deitary consult to help controlilng diet intake. We offer two educational handouts during CR sessions.             Nutrition Assessments:  Nutrition Assessments - 09/18/21 0843       MEDFICTS Scores   Pre Score 35            MEDIFICTS Score Key: ?70 Need to make dietary changes  40-70 Heart Healthy Diet ? 40 Therapeutic Level Cholesterol Diet   Picture Your Plate Scores: [id number] Unhealthy dietary pattern with much room for improvement. 41-50 Dietary pattern unlikely to meet recommendations for good health and room for improvement. 51-60 More healthful dietary pattern, with some room for improvement.  >60 Healthy dietary pattern, although there may be some specific behaviors that could be improved.    Nutrition Goals Re-Evaluation:   Nutrition Goals Discharge (Final Nutrition Goals Re-Evaluation):   Psychosocial: Target Goals: Acknowledge presence or absence of significant depression and/or stress, maximize coping skills, provide positive support  system. Participant is able to verbalize types and ability to use techniques and skills needed for reducing stress  and depression.  Initial Review & Psychosocial Screening:  Initial Psych Review & Screening - 09/18/21 0840       Initial Review   Current issues with Current Anxiety/Panic      Family Dynamics   Good Support System? Yes    Comments She has 3 nieces and several friends who are her support system.      Barriers   Psychosocial barriers to participate in program There are no identifiable barriers or psychosocial needs.      Screening Interventions   Interventions Encouraged to exercise    Expected Outcomes Long Term goal: The participant improves quality of Life and PHQ9 Scores as seen by post scores and/or verbalization of changes;Short Term goal: Identification and review with participant of any Quality of Life or Depression concerns found by scoring the questionnaire.             Quality of Life Scores:  Quality of Life - 09/18/21 0938       Quality of Life   Select Quality of Life      Quality of Life Scores   Health/Function Pre 25.71 %    Socioeconomic Pre 30 %    Psych/Spiritual Pre 25.64 %    Family Pre 12 %    GLOBAL Pre 26.13 %            Scores of 19 and below usually indicate a poorer quality of life in these areas.  A difference of  2-3 points is a clinically meaningful difference.  A difference of 2-3 points in the total score of the Quality of Life Index has been associated with significant improvement in overall quality of life, self-image, physical symptoms, and general health in studies assessing change in quality of life.  PHQ-9: Review Flowsheet       09/18/2021  Depression screen PHQ 2/9  Decreased Interest 0  Down, Depressed, Hopeless 0  PHQ - 2 Score 0  Altered sleeping 0  Tired, decreased energy 1  Change in appetite 2  Feeling bad or failure about yourself  0  Trouble concentrating 0  Moving slowly or fidgety/restless 0   Suicidal thoughts 0  PHQ-9 Score 3  Difficult doing work/chores Not difficult at all   Interpretation of Total Score  Total Score Depression Severity:  1-4 = Minimal depression, 5-9 = Mild depression, 10-14 = Moderate depression, 15-19 = Moderately severe depression, 20-27 = Severe depression   Psychosocial Evaluation and Intervention:  Psychosocial Evaluation - 09/18/21 0951       Psychosocial Evaluation & Interventions   Interventions Stress management education;Relaxation education;Encouraged to exercise with the program and follow exercise prescription    Comments Pt has no barriers to participating in CR. She is treated for anxiety with escitalopram 5 mg daily. Her anxiety began in 2020 when she went into atrial fibrillation for the first time. This was a stressful time for her, and she reports that the medication helped tremendously. She recently had another bout with atrial fibrillation and was able to handle it well mentally. She has no other identifiable psychosocial issues. 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 She also travels frequently to horse shows. She scored a 3 on her PHQ-9 and this relates the her lack of stamina since her stent placement and her overeating. She reports that she knows what foods to eat for a heart healthy diet, but she knows that her portion sizes are too big. She has requested to  meet with a dietician for this. She reports that she has a good support system with her 3 nieces and her large group of friends. Her goals for the program are to lose weight and to improve her strength, stamina, balance, and flexibility. She is already taking Ozempic injections weekly to help her with weight loss. She would like to be able to resume her normal horse back riding like before her stent. She is able to ride some now, but has to take more breaks than she is used to. She is excited to start the program.    Expected Outcomes Pt's  anxiety will continue to be treated and she will have no other identifiable psychosocial issues.    Continue Psychosocial Services  No Follow up required             Psychosocial Re-Evaluation:  Psychosocial Re-Evaluation     Row Name 09/19/21 1329 10/15/21 0916 11/12/21 0749         Psychosocial Re-Evaluation   Current issues with Current Anxiety/Panic Current Anxiety/Panic Current Anxiety/Panic     Comments Pt is new to the program, starting on 09/24/21.  Will continue to monitor. Patient has completed 6 sessions and continues to have no psychosocial barriers identified. She seems to enjoy coming to the sessions and demonstrates an interest in improving her health. Her anxiety continues to be treated with Escitalopram and she feels it is controlled. We will continue to monitor her progress. Patient has completed 16  sessions and continues to have no psychosocial barriers identified. She continues to enjoy coming to the sessions and demonstrates an interest in improving her health. Her anxiety continues to be treated with Escitalopram and she feels it is controlled. We will continue to monitor her progress.     Expected Outcomes -- Patient will continue to have no psychosocial barriers identified and her anxiety will continue to be managed. Patient will continue to have no psychosocial barriers identified and her anxiety will continue to be managed.     Interventions Relaxation education;Stress management education Relaxation education;Stress management education Relaxation education;Stress management education     Continue Psychosocial Services  No Follow up required No Follow up required No Follow up required              Psychosocial Discharge (Final Psychosocial Re-Evaluation):  Psychosocial Re-Evaluation - 11/12/21 0749       Psychosocial Re-Evaluation   Current issues with Current Anxiety/Panic    Comments Patient has completed 16  sessions and continues to have no  psychosocial barriers identified. She continues to enjoy coming to the sessions and demonstrates an interest in improving her health. Her anxiety continues to be treated with Escitalopram and she feels it is controlled. We will continue to monitor her progress.    Expected Outcomes Patient will continue to have no psychosocial barriers identified and her anxiety will continue to be managed.    Interventions Relaxation education;Stress management education    Continue Psychosocial Services  No Follow up required             Vocational Rehabilitation: Provide vocational rehab assistance to qualifying candidates.   Vocational Rehab Evaluation & Intervention:  Vocational Rehab - 09/18/21 0848       Initial Vocational Rehab Evaluation & Intervention   Assessment shows need for Vocational Rehabilitation No   Retired and does not want to go back to work            Education: Education Goals: Education classes will be  provided on a weekly basis, covering required topics. Participant will state understanding/return demonstration of topics presented.  Learning Barriers/Preferences:   Education Topics: Hypertension, Hypertension Reduction -Define heart disease and high blood pressure. Discus how high blood pressure affects the body and ways to reduce high blood pressure.   Exercise and Your Heart -Discuss why it is important to exercise, the FITT principles of exercise, normal and abnormal responses to exercise, and how to exercise safely.   Angina -Discuss definition of angina, causes of angina, treatment of angina, and how to decrease risk of having angina.   Cardiac Medications -Review what the following cardiac medications are used for, how they affect the body, and side effects that may occur when taking the medications.  Medications include Aspirin, Beta blockers, calcium channel blockers, ACE Inhibitors, angiotensin receptor blockers, diuretics, digoxin, and  antihyperlipidemics. Flowsheet Row CARDIAC REHAB PHASE II EXERCISE from 11/07/2021 in [place]  Date 09/26/21  Educator DF  Instruction Review Code 1- Verbalizes Understanding       Congestive Heart Failure -Discuss the definition of CHF, how to live with CHF, the signs and symptoms of CHF, and how keep track of weight and sodium intake. Flowsheet Row CARDIAC REHAB PHASE II EXERCISE from 11/07/2021 in [place]  Date 10/03/21  Educator PB  Instruction Review Code 1- Verbalizes Understanding       Heart Disease and Intimacy -Discus the effect sexual activity has on the heart, how changes occur during intimacy as we age, and safety during sexual activity.   Smoking Cessation / COPD -Discuss different methods to quit smoking, the health benefits of quitting smoking, and the definition of COPD. Flowsheet Row CARDIAC REHAB PHASE II EXERCISE from 11/07/2021 in [place]  Date 10/17/21  Educator pb  Instruction Review Code 1- Verbalizes Understanding       Nutrition I: Fats -Discuss the types of cholesterol, what cholesterol does to the heart, and how cholesterol levels can be controlled. Flowsheet Row CARDIAC REHAB PHASE II EXERCISE from 11/07/2021 in [place]  Date 10/24/21  Educator [place]  Instruction Review Code 1- Verbalizes Understanding       Nutrition II: Labels -Discuss the different components of food labels and how to read food label [place] from 11/07/2021 in [place]  Date 11/07/21  Educator DF  Instruction Review Code 1- Verbalizes Understanding       Heart Parts/Heart Disease and PAD -Discuss the anatomy of the heart, the pathway of blood circulation through the heart, and these are affected by heart disease.   Stress I: Signs and Symptoms -Discuss the causes of stress, how stress may lead to anxiety and  depression, and ways to limit stress.   Stress II: Relaxation -Discuss different types of relaxation techniques to limit stress.   Warning Signs of Stroke / TIA -Discuss definition of a stroke, what the signs and symptoms are of a stroke, and how to identify when someone is having stroke.   Knowledge Questionnaire Score:  Knowledge Questionnaire Score - 09/18/21 0845       Knowledge Questionnaire Score   Pre Score 20/24             Core Components/Risk Factors/Patient Goals at Admission:  Personal Goals and Risk Factors at Admission - 09/18/21 0849       Core Components/Risk Factors/Patient Goals on Admission    Weight Management Yes;Weight Loss;Weight Maintenance    Intervention  Obesity: Provide education and appropriate resources to help participant work on and attain dietary goals.;Weight Management/Obesity: Establish reasonable short term and long term weight goals.;Weight Management: Provide education and appropriate resources to help participant work on and attain dietary goals.;Weight Management: Develop a combined nutrition and exercise program designed to reach desired caloric intake, while maintaining appropriate intake of nutrient and fiber, sodium and fats, and appropriate energy expenditure required for the weight goal.    Expected Outcomes Short Term: Continue to assess and modify interventions until short term weight is achieved;Weight Maintenance: Understanding of the daily nutrition guidelines, which includes 25-35% calories from fat, 7% or less cal from saturated fats, less than 200mg  cholesterol, less than 1.5gm of sodium, & 5 or more servings of fruits and vegetables daily;Long Term: Adherence to nutrition and physical activity/exercise program aimed toward attainment of established weight goal;Weight Loss: Understanding of general recommendations for a balanced deficit meal plan, which promotes 1-2 lb weight loss per week and includes a negative energy balance of  [phone] kcal/d;Understanding recommendations for meals to include 15-35% energy as protein, 25-35% energy from fat, 35-60% energy from carbohydrates, less than 200mg  of dietary cholesterol, 20-35 gm of total fiber daily;Understanding of distribution of calorie intake throughout the day with the consumption of 4-5 meals/snacks    Hypertension Yes    Intervention Provide education on lifestyle modifcations including regular physical activity/exercise, weight management, moderate sodium restriction and increased consumption of fresh fruit, vegetables, and low fat dairy, alcohol moderation, and smoking cessation.;Monitor prescription use compliance.    Expected Outcomes Short Term: Continued assessment and intervention until BP is < 140/7mm HG in hypertensive participants. < 130/84mm HG in hypertensive participants with diabetes, heart failure or chronic kidney disease.;Long Term: Maintenance of blood pressure at goal levels.    Personal Goal Other Yes    Personal Goal Improve strength, stamina, balance, and flexibility    Intervention Attend CR three days per week and continue with her home exercise program.    Expected Outcomes Pt will be able to improve her strength, stamina, balance, and flexibility, and this will improve her horse back riding skills.             Core Components/Risk Factors/Patient Goals Review:   Goals and Risk Factor Review     Row Name 09/19/21 1331 10/15/21 0919 11/12/21 0750         Core Components/Risk Factors/Patient Goals Review   Personal Goals Review Weight Management/Obesity;Hypertension;Other Weight Management/Obesity;Hypertension;Other Weight Management/Obesity;Hypertension;Other     Review Pt refered to CR for Cardiac stent.  Pt plans to start on Monday 04/24.  Pt has multiple risk factors for CAD. She is participating in the program for risk motification.  Her personal goals for the programto increase stamina, balance and flexibility, loss weight and eat  healthier.  We will work with her to meet her goals. Patient has completed 6 sessions with her current weight at 184.1 down 2 lbs from last 30 day review. She is doing well in the program with progressions and consistent attendance. 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 Her blood pressue is well controlled. Her personal goals for the program continue to be to increase her strength, stamina, balance, and flexibility; lose weight; improve her diet and be able to get back to playing golf and riding horses again. We will continue to monitor her progress as she works towards meeting these goals. Patient has completed 16 sessions with her current weight at 185.5 up 1.4  lbs from last 30 day review. She continue to do well in the program with progressions and consistent attendance. Her blood pressue is well controlled. She saw Dr. Marlou Porch 5/17 for routine follow up of Atrial Fibrillation. She continues to be in NSR with medical therapy. An ablation is planned for 7/24. She reported to Dr. Marlou Porch that she feels her strength is improving everyday. Her personal goals for the program continue to be to increase her strength, stamina, balance, and flexibility; lose weight; improve her diet and be able to get back to playing golf and riding horses again. We will continue to monitor her progress as she works towards meeting these goals.     Expected Outcomes Pt will complete the program and meet personal and program goals. Pt will complete the program and meet personal and program goals. Pt will complete the program and meet personal and program goals.              Core Components/Risk Factors/Patient Goals at Discharge (Final Review):   Goals and Risk Factor Review - 11/12/21 0750       Core Components/Risk Factors/Patient Goals Review   Personal Goals Review Weight Management/Obesity;Hypertension;Other    Review Patient has completed 16 sessions with her current weight at 185.5 up 1.4 lbs from  last 30 day review. She continue to do well in the program with progressions and consistent attendance. Her blood pressue is well controlled. She saw Dr. Marlou Porch 5/17 for routine follow up of Atrial Fibrillation. She continues to be in NSR with medical therapy. An ablation is planned for 7/24. She reported to Dr. Marlou Porch that she feels her strength is improving everyday. Her personal goals for the program continue to be to increase her strength, stamina, balance, and flexibility; lose weight; improve her diet and be able to get back to playing golf and riding horses again. We will continue to monitor her progress as she works towards meeting these goals.    Expected Outcomes Pt will complete the program and meet personal and program goals.             ITP Comments:   Comments: ITP REVIEW Pt is making expected progress toward Cardiac Rehab goals after completing 16 sessions. Recommend continued exercise, life style modification, education, and increased stamina and strength.

## 2021-11-21 NOTE — Progress Notes (Signed)
Daily Session Note  Patient Details  Name: Monya Kozakiewicz MRN: [id number] Date of Birth: 10/04/1945 Referring Provider:   Flowsheet Row CARDIAC REHAB PHASE II ORIENTATION from 09/18/2021 in [place]  Referring Provider Dr. Burt Knack       Encounter Date: 11/21/2021  Check In:  Session Check In - 11/21/21 0930       Check-In   Supervising physician immediately available to respond to emergencies [facility] MD immediately available    Physician(s) Dr. Gasper Sells    Location [facility]    Staff Present Redge Gainer, BS, Exercise Physiologist;Dalton Kris Mouton, MS, ACSM-CEP, Exercise Physiologist    Virtual Visit No    Medication changes reported     No    Fall or balance concerns reported    No    Tobacco Cessation No Change    Warm-up and Cool-down Performed as group-led instruction    Resistance Training Performed Yes    VAD Patient? No    PAD/SET Patient? No      Pain Assessment   Currently in Pain? No/denies    Multiple Pain Sites No             Capillary Blood Glucose: No results found for this or any previous visit (from the past 24 hour(s)).    Social History   Tobacco Use  Smoking Status Former   Types: Cigarettes   Quit date: 07/13/1998   Years since quitting: 23.3  Smokeless Tobacco Never    Goals Met:  Independence with exercise equipment Exercise tolerated well No report of concerns or symptoms today Strength training completed today  Goals Unmet:  Not Applicable  Comments: check out 1030   Dr. Carlyle Dolly is Medical Director for [facility]

## 2021-11-23 ENCOUNTER — Encounter (HOSPITAL_COMMUNITY)
Admission: RE | Admit: 2021-11-23 | Discharge: 2021-11-23 | Disposition: A | Discharge status: Home / Self Care | Payer: Medicare Other | Source: Ambulatory Visit | Attending: Cardiology | Admitting: Cardiology

## 2021-11-23 DIAGNOSIS — Z955 Presence of coronary angioplasty implant and graft: Secondary | ICD-10-CM | POA: Diagnosis not present

## 2021-11-26 ENCOUNTER — Encounter (HOSPITAL_COMMUNITY)
Admission: RE | Admit: 2021-11-26 | Discharge: 2021-11-26 | Disposition: A | Discharge status: Home / Self Care | Payer: Medicare Other | Source: Ambulatory Visit | Attending: Cardiology | Admitting: Cardiology

## 2021-11-26 DIAGNOSIS — Z955 Presence of coronary angioplasty implant and graft: Secondary | ICD-10-CM | POA: Diagnosis not present

## 2021-11-28 ENCOUNTER — Encounter (HOSPITAL_COMMUNITY)
Admission: RE | Admit: 2021-11-28 | Discharge: 2021-11-28 | Disposition: A | Discharge status: Home / Self Care | Payer: Medicare Other | Source: Ambulatory Visit | Attending: Cardiology | Admitting: Cardiology

## 2021-11-28 DIAGNOSIS — Z955 Presence of coronary angioplasty implant and graft: Secondary | ICD-10-CM | POA: Diagnosis not present

## 2021-11-28 NOTE — Progress Notes (Signed)
Daily Session Note  Patient Details  Name: Avanthika Dehnert MRN: [id number] Date of Birth: 05/01/1946 Referring Provider:   Flowsheet Row CARDIAC REHAB PHASE II ORIENTATION from 09/18/2021 in [place]  Referring Provider Dr. Burt Knack       Encounter Date: 11/28/2021  Check In:  Session Check In - 11/28/21 0930       Check-In   Supervising physician immediately available to respond to emergencies [facility] MD immediately available    Physician(s) Dr Harl Bowie    Location [facility]    Staff Present Redge Gainer, BS, Exercise Physiologist;Dalton Kris Mouton, MS, ACSM-CEP, Exercise Physiologist;Shareeka Yim Hassell Done, RN, BSN    Virtual Visit No    Medication changes reported     No    Fall or balance concerns reported    No    Tobacco Cessation No Change    Warm-up and Cool-down Performed as group-led instruction    Resistance Training Performed Yes    VAD Patient? No    PAD/SET Patient? No      Pain Assessment   Currently in Pain? No/denies    Multiple Pain Sites No             Capillary Blood Glucose: No results found for this or any previous visit (from the past 24 hour(s)).    Social History   Tobacco Use  Smoking Status Former   Types: Cigarettes   Quit date: 07/13/1998   Years since quitting: 23.3  Smokeless Tobacco Never    Goals Met:  Independence with exercise equipment Exercise tolerated well No report of concerns or symptoms today Strength training completed today  Goals Unmet:  Not Applicable  Comments: Checkout at 1030.   Dr. Carlyle Dolly is Medical Director for [facility]

## 2021-11-29 ENCOUNTER — Telehealth: Payer: Self-pay | Admitting: Cardiology

## 2021-11-29 ENCOUNTER — Encounter: Payer: Self-pay | Admitting: *Deleted

## 2021-11-29 NOTE — Telephone Encounter (Signed)
Pt stated that she would like a letter sent to her about instructions for her ablation. I informed her that she will get a call to go over them but she is requesting a letter as well. She would also like a specific location of each procedure, her ablation and ct. Informed the pt that they are a [facility],  but she would like specific instructions and places on both.

## 2021-11-29 NOTE — Telephone Encounter (Signed)
Informed that letters for both CT & ablation were given to her at [facility]. She states she does not know where they are. She would like them sent via mychart. Aware I will send today. She will let me know if she needs to verbally review them again. She appreciates my return call.

## 2021-11-30 ENCOUNTER — Encounter (HOSPITAL_COMMUNITY)
Admission: RE | Admit: 2021-11-30 | Discharge: 2021-11-30 | Disposition: A | Discharge status: Home / Self Care | Payer: Medicare Other | Source: Ambulatory Visit | Attending: Cardiology | Admitting: Cardiology

## 2021-11-30 DIAGNOSIS — Z955 Presence of coronary angioplasty implant and graft: Secondary | ICD-10-CM

## 2021-11-30 NOTE — Progress Notes (Signed)
Daily Session Note  Patient Details  Name: Colleen Cole MRN: [id number] Date of Birth: 07-08-1945 Referring Provider:   Flowsheet Row CARDIAC REHAB PHASE II ORIENTATION from 09/18/2021 in [place]  Referring Provider Dr. Burt Knack       Encounter Date: 11/30/2021  Check In:  Session Check In - 11/30/21 0930       Check-In   Supervising physician immediately available to respond to emergencies [facility] MD immediately available    Physician(s) Dr. Gasper Sells    Location [facility]    Staff Present Redge Gainer, BS, Exercise Physiologist;Dalton Kris Mouton, MS, ACSM-CEP, Exercise Physiologist;Daphyne Hassell Done, RN, BSN    Virtual Visit No    Medication changes reported     No    Fall or balance concerns reported    No    Tobacco Cessation No Change    Warm-up and Cool-down Performed as group-led instruction    Resistance Training Performed Yes    VAD Patient? No    PAD/SET Patient? No      Pain Assessment   Currently in Pain? No/denies    Multiple Pain Sites No             Capillary Blood Glucose: No results found for this or any previous visit (from the past 24 hour(s)).    Social History   Tobacco Use  Smoking Status Former   Types: Cigarettes   Quit date: 07/13/1998   Years since quitting: 23.4  Smokeless Tobacco Never    Goals Met:  Independence with exercise equipment Exercise tolerated well No report of concerns or symptoms today Strength training completed today  Goals Unmet:  Not Applicable  Comments: check out 1030   Dr. Carlyle Dolly is Medical Director for [place]

## 2021-12-03 ENCOUNTER — Encounter (HOSPITAL_COMMUNITY)
Admission: RE | Admit: 2021-12-03 | Discharge: 2021-12-03 | Disposition: A | Discharge status: Home / Self Care | Payer: Medicare Other | Source: Ambulatory Visit | Attending: Cardiology | Admitting: Cardiology

## 2021-12-03 DIAGNOSIS — Z955 Presence of coronary angioplasty implant and graft: Secondary | ICD-10-CM | POA: Diagnosis present

## 2021-12-03 NOTE — Progress Notes (Signed)
Daily Session Note  Patient Details  Name: Colleen Cole MRN: [id number] Date of Birth: 1945/11/19 Referring Provider:   Flowsheet Row CARDIAC REHAB PHASE II ORIENTATION from 09/18/2021 in [place]  Referring Provider Dr. Burt Knack       Encounter Date: 12/03/2021  Check In:  Session Check In - 12/03/21 0930       Check-In   Supervising physician immediately available to respond to emergencies [facility] MD immediately available    Physician(s) Dr. Johnsie Cancel    Location [facility]    Staff Present Redge Gainer, BS, Exercise Physiologist;Dalton Kris Mouton, MS, ACSM-CEP, Exercise Physiologist    Virtual Visit No    Medication changes reported     No    Fall or balance concerns reported    No    Tobacco Cessation No Change    Warm-up and Cool-down Performed as group-led instruction    Resistance Training Performed Yes    VAD Patient? No    PAD/SET Patient? No      Pain Assessment   Currently in Pain? No/denies    Multiple Pain Sites No             Capillary Blood Glucose: No results found for this or any previous visit (from the past 24 hour(s)).    Social History   Tobacco Use  Smoking Status Former   Types: Cigarettes   Quit date: 07/13/1998   Years since quitting: 23.4  Smokeless Tobacco Never    Goals Met:  Independence with exercise equipment Exercise tolerated well No report of concerns or symptoms today Strength training completed today  Goals Unmet:  Not Applicable  Comments: check out 1030   Dr. Carlyle Dolly is Medical Director for [place]

## 2021-12-05 ENCOUNTER — Encounter (HOSPITAL_COMMUNITY)
Admission: RE | Admit: 2021-12-05 | Discharge: 2021-12-05 | Disposition: A | Discharge status: Home / Self Care | Payer: Medicare Other | Source: Ambulatory Visit | Attending: Cardiology | Admitting: Cardiology

## 2021-12-05 ENCOUNTER — Other Ambulatory Visit: Payer: Self-pay | Admitting: Cardiovascular Disease

## 2021-12-05 VITALS — Ht 63.0 in | Wt 185.2 lb

## 2021-12-05 DIAGNOSIS — I480 Paroxysmal atrial fibrillation: Secondary | ICD-10-CM

## 2021-12-05 DIAGNOSIS — Z955 Presence of coronary angioplasty implant and graft: Secondary | ICD-10-CM

## 2021-12-05 NOTE — Progress Notes (Signed)
Discharge Progress Report  Patient Details  Name: Colleen Cole MRN: [id number] Date of Birth: 12/28/1945 Referring Provider:   Flowsheet Row CARDIAC REHAB PHASE II ORIENTATION from 09/18/2021 in [facility]  Referring Provider Dr. Burt Knack        Number of Visits: 23  Reason for Discharge:  Patient reached a stable level of exercise. Patient independent in their exercise. Patient has met program and personal goals.  Smoking History:  Social History   Tobacco Use  Smoking Status Former   Types: Cigarettes   Quit date: 07/13/1998   Years since quitting: 23.4  Smokeless Tobacco Never    Diagnosis:  S/P coronary artery stent placement  ADL [facility]:   Initial Exercise Prescription:  Initial Exercise Prescription - 09/18/21 0900       Date of Initial Exercise RX and Referring Provider   Date 09/18/21    Referring Provider Dr. Burt Knack    Expected Discharge Date 12/14/21      Treadmill   MPH 2.5    Grade 0    Minutes 17      NuStep   Level 1    SPM 60    Minutes 22      Prescription Details   Frequency (times per week) 3    Duration Progress to 30 minutes of continuous aerobic without signs/symptoms of physical distress      Intensity   THRR 40-80% of Max Heartrate 59-118    Ratings of Perceived Exertion 11-13      Resistance Training   Training Prescription Yes    Weight 3    Reps 10-15             Discharge Exercise Prescription (Final Exercise Prescription Changes):  Exercise Prescription Changes - 12/03/21 1000       Response to Exercise   Blood Pressure (Admit) 118/60    Blood Pressure (Exercise) 116/60    Blood Pressure (Exit) 110/60    Heart Rate (Admit) 60 bpm    Heart Rate (Exercise) 84 bpm    Heart Rate (Exit) 69 bpm    Rating of Perceived Exertion (Exercise) 14    Duration Continue with 30 min of aerobic exercise without signs/symptoms of physical distress.    Intensity THRR unchanged      Progression    Progression Continue to progress workloads to maintain intensity without signs/symptoms of physical distress.      Resistance Training   Training Prescription Yes    Weight 5    Reps 10-15    Time 10 Minutes      Treadmill   MPH 3    Grade 2    Minutes 17    METs 4.26      NuStep   Level 2    SPM 131    Minutes 22    METs 4.26             Functional Capacity:  6 Minute Walk     Row Name 09/18/21 0934 12/05/21 0955       6 Minute Walk   Phase Initial Discharge    Distance 1500 feet 1600 feet    Distance Feet Change -- 100 ft    Walk Time 6 minutes 6 minutes    # of Rest Breaks 0 0    MPH 2.84 3.03    METS 2.5 2.73    RPE 12 13    VO2 Peak 8.87 9.56    Symptoms No No  Resting HR 64 bpm 59 bpm    Resting BP 108/68 110/62    Resting Oxygen Saturation  95 % 95 %    Exercise Oxygen Saturation  during 6 min walk 96 % 96 %    Max Ex. HR 77 bpm 80 bpm    Max Ex. BP 136/60 134/60    2 Minute Post BP 118/60 120/60             Psychological, QOL, Others - Outcomes: PHQ 2/9:    12/05/2021    3:11 PM 09/18/2021    8:34 AM  Depression screen PHQ 2/9  Decreased Interest 0 0  Down, Depressed, Hopeless 0 0  PHQ - 2 Score 0 0  Altered sleeping 0 0  Tired, decreased energy 0 1  Change in appetite 0 2  Feeling bad or failure about yourself  0 0  Trouble concentrating 0 0  Moving slowly or fidgety/restless 0 0  Suicidal thoughts 0 0  PHQ-9 Score 0 3  Difficult doing work/chores Not difficult at all Not difficult at all    Quality of Life:  Quality of Life - 12/05/21 0957       Quality of Life   Select Quality of Life      Quality of Life Scores   Health/Function Pre 25.71 %    Health/Function Post 26.68 %    Health/Function % Change 3.77 %    Socioeconomic Pre 30 %    Socioeconomic Post 30 %    Socioeconomic % Change  0 %    Psych/Spiritual Pre 25.64 %    Psych/Spiritual Post 25.57 %    Psych/Spiritual % Change -0.27 %    Family Pre 12 %     Family Post 30 %    Family % Change 150 %    GLOBAL Pre 26.13 %    GLOBAL Post 27.33 %    GLOBAL % Change 4.59 %             Personal Goals: Goals established at orientation with interventions provided to work toward goal.  Personal Goals and Risk Factors at Admission - 09/18/21 0849       Core Components/Risk Factors/Patient Goals on Admission    Weight Management Yes;Weight Loss;Weight Maintenance    Intervention Obesity: Provide education and appropriate resources to help participant work on and attain dietary goals.;Weight Management/Obesity: Establish reasonable short term and long term weight goals.;Weight Management: Provide education and appropriate resources to help participant work on and attain dietary goals.;Weight Management: Develop a combined nutrition and exercise program designed to reach desired caloric intake, while maintaining appropriate intake of nutrient and fiber, sodium and fats, and appropriate energy expenditure required for the weight goal.    Expected Outcomes Short Term: Continue to assess and modify interventions until short term weight is achieved;Weight Maintenance: Understanding of the daily nutrition guidelines, which includes 25-35% calories from fat, 7% or less cal from saturated fats, less than 274m cholesterol, less than 1.5gm of sodium, & 5 or more servings of fruits and vegetables daily;Long Term: Adherence to nutrition and physical activity/exercise program aimed toward attainment of established weight goal;Weight Loss: Understanding of general recommendations for a balanced deficit meal plan, which promotes 1-2 lb weight loss per week and includes a negative energy balance of [phone] kcal/d;Understanding recommendations for meals to include 15-35% energy as protein, 25-35% energy from fat, 35-60% energy from carbohydrates, less than 208mof dietary cholesterol, 20-35 gm of total fiber daily;Understanding of distribution of calorie  intake throughout  the day with the consumption of 4-5 meals/snacks    Hypertension Yes    Intervention Provide education on lifestyle modifcations including regular physical activity/exercise, weight management, moderate sodium restriction and increased consumption of fresh fruit, vegetables, and low fat dairy, alcohol moderation, and smoking cessation.;Monitor prescription use compliance.    Expected Outcomes Short Term: Continued assessment and intervention until BP is < 140/18m HG in hypertensive participants. < 130/870mHG in hypertensive participants with diabetes, heart failure or chronic kidney disease.;Long Term: Maintenance of blood pressure at goal levels.    Personal Goal Other Yes    Personal Goal Improve strength, stamina, balance, and flexibility    Intervention Attend CR three days per week and continue with her home exercise program.    Expected Outcomes Pt will be able to improve her strength, stamina, balance, and flexibility, and this will improve her horse back riding skills.              Personal Goals Discharge:  Goals and Risk Factor Review     Row Name 09/19/21 1331 10/15/21 0919 11/12/21 0750 12/05/21 1517       Core Components/Risk Factors/Patient Goals Review   Personal Goals Review Weight Management/Obesity;Hypertension;Other Weight Management/Obesity;Hypertension;Other Weight Management/Obesity;Hypertension;Other Weight Management/Obesity;Hypertension;Other    Review Pt refered to CR for Cardiac stent.  Pt plans to start on Monday 04/24.  Pt has multiple risk factors for CAD. She is participating in the program for risk motification.  Her personal goals for the programto increase stamina, balance and flexibility, loss weight and eat healthier.  We will work with her to meet her goals. Patient has completed 6 sessions with her current weight at 184.1 down 2 lbs from last 30 day review. She is doing well in the program with progressions and consistent attendance. She has missed  several sessions due to being a a horse show. Her blood pressue is well controlled. Her personal goals for the program continue to be to increase her strength, stamina, balance, and flexibility; lose weight; improve her diet and be able to get back to playing golf and riding horses again. We will continue to monitor her progress as she works towards meeting these goals. Patient has completed 16 sessions with her current weight at 185.5 up 1.4 lbs from last 30 day review. She continue to do well in the program with progressions and consistent attendance. Her blood pressue is well controlled. She saw Dr. SkMarlou Porch/17 for routine follow up of Atrial Fibrillation. She continues to be in [facility] with medical therapy. An ablation is planned for 7/24. She reported to Dr. SkMarlou Porchhat she feels her strength is improving everyday. Her personal goals for the program continue to be to increase her strength, stamina, balance, and flexibility; lose weight; improve her diet and be able to get back to playing golf and riding horses again. We will continue to monitor her progress as she works towards meeting these goals. Pt graduated from CR after 23 sessions. Her weight was stable while she was in the program. Her attendance was consistent when she was not out of town for horse shows. Her vitals were WNL's at rest and while exercising and she did not have any bouts of atrial fibrillation while in the program. She is set for an ablasion on 7/24. She reported that she felt the program was very beneficial to her and that she has gotten her strength back. She is now able to ride horses again, and she was able to  improve her diet through the education that we offered in class.    Expected Outcomes Pt will complete the program and meet personal and program goals. Pt will complete the program and meet personal and program goals. Pt will complete the program and meet personal and program goals. Pt will continue to work towards their goals post  discharge.             Exercise Goals and Review:  Exercise Goals     Row Name 09/18/21 9379 09/24/21 1046 10/22/21 1357 11/19/21 1116       Exercise Goals   Increase Physical Activity Yes Yes Yes Yes    Intervention Provide advice, education, support and counseling about physical activity/exercise needs.;Develop an individualized exercise prescription for aerobic and resistive training based on initial evaluation findings, risk stratification, comorbidities and participant's personal goals. Provide advice, education, support and counseling about physical activity/exercise needs.;Develop an individualized exercise prescription for aerobic and resistive training based on initial evaluation findings, risk stratification, comorbidities and participant's personal goals. Provide advice, education, support and counseling about physical activity/exercise needs.;Develop an individualized exercise prescription for aerobic and resistive training based on initial evaluation findings, risk stratification, comorbidities and participant's personal goals. Provide advice, education, support and counseling about physical activity/exercise needs.;Develop an individualized exercise prescription for aerobic and resistive training based on initial evaluation findings, risk stratification, comorbidities and participant's personal goals.    Expected Outcomes Short Term: Attend rehab on a regular basis to increase amount of physical activity.;Long Term: Add in home exercise to make exercise part of routine and to increase amount of physical activity.;Long Term: Exercising regularly at least 3-5 days a week. Short Term: Attend rehab on a regular basis to increase amount of physical activity.;Long Term: Add in home exercise to make exercise part of routine and to increase amount of physical activity.;Long Term: Exercising regularly at least 3-5 days a week. Short Term: Attend rehab on a regular basis to increase amount of  physical activity.;Long Term: Add in home exercise to make exercise part of routine and to increase amount of physical activity.;Long Term: Exercising regularly at least 3-5 days a week. Short Term: Attend rehab on a regular basis to increase amount of physical activity.;Long Term: Add in home exercise to make exercise part of routine and to increase amount of physical activity.;Long Term: Exercising regularly at least 3-5 days a week.    Increase Strength and Stamina Yes Yes Yes Yes    Intervention Provide advice, education, support and counseling about physical activity/exercise needs.;Develop an individualized exercise prescription for aerobic and resistive training based on initial evaluation findings, risk stratification, comorbidities and participant's personal goals. Provide advice, education, support and counseling about physical activity/exercise needs.;Develop an individualized exercise prescription for aerobic and resistive training based on initial evaluation findings, risk stratification, comorbidities and participant's personal goals. Provide advice, education, support and counseling about physical activity/exercise needs.;Develop an individualized exercise prescription for aerobic and resistive training based on initial evaluation findings, risk stratification, comorbidities and participant's personal goals. Provide advice, education, support and counseling about physical activity/exercise needs.;Develop an individualized exercise prescription for aerobic and resistive training based on initial evaluation findings, risk stratification, comorbidities and participant's personal goals.    Expected Outcomes Short Term: Perform resistance training exercises routinely during rehab and add in resistance training at home;Short Term: Increase workloads from initial exercise prescription for resistance, speed, and METs.;Long Term: Improve cardiorespiratory fitness, muscular endurance and strength as measured  by increased METs and functional capacity (6MWT) Short Term:  Perform resistance training exercises routinely during rehab and add in resistance training at home;Short Term: Increase workloads from initial exercise prescription for resistance, speed, and METs.;Long Term: Improve cardiorespiratory fitness, muscular endurance and strength as measured by increased METs and functional capacity (6MWT) Short Term: Perform resistance training exercises routinely during rehab and add in resistance training at home;Short Term: Increase workloads from initial exercise prescription for resistance, speed, and METs.;Long Term: Improve cardiorespiratory fitness, muscular endurance and strength as measured by increased METs and functional capacity (6MWT) Short Term: Perform resistance training exercises routinely during rehab and add in resistance training at home;Short Term: Increase workloads from initial exercise prescription for resistance, speed, and METs.;Long Term: Improve cardiorespiratory fitness, muscular endurance and strength as measured by increased METs and functional capacity (6MWT)    Able to understand and use rate of perceived exertion (RPE) scale Yes Yes Yes Yes    Intervention Provide education and explanation on how to use RPE scale Provide education and explanation on how to use RPE scale Provide education and explanation on how to use RPE scale Provide education and explanation on how to use RPE scale    Expected Outcomes Short Term: Able to use RPE daily in rehab to express subjective intensity level;Long Term:  Able to use RPE to guide intensity level when exercising independently Short Term: Able to use RPE daily in rehab to express subjective intensity level;Long Term:  Able to use RPE to guide intensity level when exercising independently Short Term: Able to use RPE daily in rehab to express subjective intensity level;Long Term:  Able to use RPE to guide intensity level when exercising independently  Short Term: Able to use RPE daily in rehab to express subjective intensity level;Long Term:  Able to use RPE to guide intensity level when exercising independently    Knowledge and understanding of Target Heart Rate Range (THRR) Yes Yes Yes Yes    Intervention Provide education and explanation of THRR including how the numbers were predicted and where they are located for reference Provide education and explanation of THRR including how the numbers were predicted and where they are located for reference Provide education and explanation of THRR including how the numbers were predicted and where they are located for reference Provide education and explanation of THRR including how the numbers were predicted and where they are located for reference    Expected Outcomes Short Term: Able to state/look up THRR;Long Term: Able to use THRR to govern intensity when exercising independently;Short Term: Able to use daily as guideline for intensity in rehab Short Term: Able to state/look up THRR;Long Term: Able to use THRR to govern intensity when exercising independently;Short Term: Able to use daily as guideline for intensity in rehab Short Term: Able to state/look up THRR;Long Term: Able to use THRR to govern intensity when exercising independently;Short Term: Able to use daily as guideline for intensity in rehab Short Term: Able to state/look up THRR;Long Term: Able to use THRR to govern intensity when exercising independently;Short Term: Able to use daily as guideline for intensity in rehab    Able to check pulse independently Yes Yes Yes Yes    Intervention Provide education and demonstration on how to check pulse in carotid and radial arteries.;Review the importance of being able to check your own pulse for safety during independent exercise Provide education and demonstration on how to check pulse in carotid and radial arteries.;Review the importance of being able to check your own pulse for safety during  independent  exercise Provide education and demonstration on how to check pulse in carotid and radial arteries.;Review the importance of being able to check your own pulse for safety during independent exercise Provide education and demonstration on how to check pulse in carotid and radial arteries.;Review the importance of being able to check your own pulse for safety during independent exercise    Expected Outcomes Short Term: Able to explain why pulse checking is important during independent exercise;Long Term: Able to check pulse independently and accurately Short Term: Able to explain why pulse checking is important during independent exercise;Long Term: Able to check pulse independently and accurately Short Term: Able to explain why pulse checking is important during independent exercise;Long Term: Able to check pulse independently and accurately Short Term: Able to explain why pulse checking is important during independent exercise;Long Term: Able to check pulse independently and accurately    Understanding of Exercise Prescription Yes Yes Yes Yes    Intervention Provide education, explanation, and written materials on patient's individual exercise prescription Provide education, explanation, and written materials on patient's individual exercise prescription Provide education, explanation, and written materials on patient's individual exercise prescription Provide education, explanation, and written materials on patient's individual exercise prescription    Expected Outcomes Short Term: Able to explain program exercise prescription;Long Term: Able to explain home exercise prescription to exercise independently Short Term: Able to explain program exercise prescription;Long Term: Able to explain home exercise prescription to exercise independently Short Term: Able to explain program exercise prescription;Long Term: Able to explain home exercise prescription to exercise independently Short Term: Able to  explain program exercise prescription;Long Term: Able to explain home exercise prescription to exercise independently             Exercise Goals Re-Evaluation:  Exercise Goals Re-Evaluation     Row Name 09/24/21 1046 10/22/21 1359 11/19/21 1116         Exercise Goal Re-Evaluation   Exercise Goals Review Increase Physical Activity;Increase Strength and Stamina;Able to understand and use rate of perceived exertion (RPE) scale;Knowledge and understanding of Target Heart Rate Range (THRR);Able to check pulse independently;Understanding of Exercise Prescription Increase Strength and Stamina;Increase Physical Activity;Able to understand and use rate of perceived exertion (RPE) scale;Knowledge and understanding of Target Heart Rate Range (THRR);Able to check pulse independently;Understanding of Exercise Prescription Increase Strength and Stamina;Increase Physical Activity;Able to understand and use rate of perceived exertion (RPE) scale;Knowledge and understanding of Target Heart Rate Range (THRR);Able to check pulse independently;Understanding of Exercise Prescription     Comments Pt has completed her first session of cardiac rehab. She is very motivated to get back to riding horses and playing golf. She is currently exercising at 3.29 METs on the stepper. Will continue to montior and progress as able. Pt has completed 10 sessions of cardiac rehab. She continues to be motivated during class and is increasing her workload. She is exercising outside of rehab on her own. She is currently exercising at 4.12 METs on the treadmill. Will continue to monitor and progress as able. Pt has completed 16 sessions of cardiac rehab. She continues to be motivated during class and continues to increase her workloads. She is active outside of rehab and exercises multiple days per week. She has been on vacation during the last 5 sessions, but she is set to return on 6/21. She is currently exercising at 4.12 METs on the  treadmill. Will continue to monitor and progress as able.     Expected Outcomes Through exercise at rehab and at home,  the patient will meet their stated goals. Through exercise at rehab and at home, the patient will meet their stated goals. Through exercise at rehab and at home, the patient will meet their stated goals.              Nutrition & Weight - Outcomes:  Pre Biometrics - 09/18/21 6203       Pre Biometrics   Height 5' 3"  (1.6 m)    Weight 185 lb 13.6 oz (84.3 kg)    Waist Circumference 40.5 inches    Hip Circumference 45 inches    Waist to Hip Ratio 0.9 %    BMI (Calculated) 32.93    Triceps Skinfold 28 mm    % Body Fat 44.3 %    Grip Strength 17.7 kg    Flexibility 6 in    Single Leg Stand 26 seconds             Post Biometrics - 12/05/21 0955        Post  Biometrics   Height 5' 3"  (1.6 m)    Weight 185 lb 3 oz (84 kg)    Waist Circumference 40.5 inches    Hip Circumference 45 inches    Waist to Hip Ratio 0.9 %    BMI (Calculated) 32.81    Triceps Skinfold 18 mm    % Body Fat 41.9 %    Grip Strength 26.3 kg    Flexibility 8 in    Single Leg Stand 55 seconds             Nutrition:  Nutrition Therapy & Goals - 09/19/21 1337       Personal Nutrition Goals   Comments Pt scored on her diet assessment.  Paient request deitary consult to help controlilng diet intake. We offer two educational handouts during CR sessions.             Nutrition Discharge:  Nutrition Assessments - 12/05/21 1509       MEDFICTS Scores   Pre Score 35    Post Score 18    Score Difference -17             Education Questionnaire Score:  Knowledge Questionnaire Score - 12/05/21 1509       Knowledge Questionnaire Score   Pre Score 20/24    Post Score 21/24             Goals reviewed with patient; copy given to patient. Pt graduated from CR after 23 sessions. She was able to improve her walk test distance by 6.7%, and her MET level at graduation  was 4.12. She reports that she will continue to exercise by getting a personal trainer at a gym or joining our forever fit maintenance program.

## 2021-12-05 NOTE — Progress Notes (Signed)
Daily Session Note  Patient Details  Name: Colleen Cole MRN: [id number] Date of Birth: 1945/07/07 Referring Provider:   Flowsheet Row CARDIAC REHAB PHASE II ORIENTATION from 09/18/2021 in [place]  Referring Provider Dr. Burt Knack       Encounter Date: 12/05/2021  Check In:  Session Check In - 12/05/21 0936       Check-In   Supervising physician immediately available to respond to emergencies [facility] MD immediately available    Physician(s) Dr. Johnsie Cancel    Location [facility]    Staff Present [facility], BS, Exercise Physiologist;Jazelle Achey Wynetta Emery, RN, Bjorn Loser, MS, ACSM-CEP, Exercise Physiologist    Virtual Visit No    Medication changes reported     No    Fall or balance concerns reported    No    Tobacco Cessation No Change    Warm-up and Cool-down Performed as group-led instruction    Resistance Training Performed Yes    VAD Patient? No    PAD/SET Patient? No      Pain Assessment   Currently in Pain? No/denies    Multiple Pain Sites No             Capillary Blood Glucose: No results found for this or any previous visit (from the past 24 hour(s)).    Social History   Tobacco Use  Smoking Status Former   Types: Cigarettes   Quit date: 07/13/1998   Years since quitting: 23.4  Smokeless Tobacco Never    Goals Met:  Independence with exercise equipment Exercise tolerated well No report of concerns or symptoms today Strength training completed today  Goals Unmet:  Not Applicable  Comments: Check out 1030.   Dr. Carlyle Dolly is Medical Director for [facility]

## 2021-12-07 ENCOUNTER — Encounter (HOSPITAL_COMMUNITY): Payer: Medicare Other

## 2021-12-10 ENCOUNTER — Encounter (HOSPITAL_COMMUNITY): Payer: Medicare Other

## 2021-12-12 ENCOUNTER — Encounter (HOSPITAL_COMMUNITY): Payer: Medicare Other

## 2021-12-13 ENCOUNTER — Ambulatory Visit: Payer: Medicare Other | Admitting: Cardiology

## 2021-12-14 ENCOUNTER — Encounter (HOSPITAL_COMMUNITY): Payer: Medicare Other

## 2021-12-17 ENCOUNTER — Other Ambulatory Visit: Payer: Medicare Other

## 2021-12-17 ENCOUNTER — Telehealth: Payer: Self-pay | Admitting: Cardiology

## 2021-12-17 DIAGNOSIS — Z01812 Encounter for preprocedural laboratory examination: Secondary | ICD-10-CM

## 2021-12-17 DIAGNOSIS — I480 Paroxysmal atrial fibrillation: Secondary | ICD-10-CM

## 2021-12-17 LAB — BASIC METABOLIC PANEL
BUN/Creatinine Ratio: 13 (ref 12–28)
BUN: 10 mg/dL (ref 8–27)
CO2: 26 mmol/L (ref 20–29)
Calcium: 9.2 mg/dL (ref 8.7–10.3)
Chloride: 103 mmol/L (ref 96–106)
Creatinine, Ser: 0.78 mg/dL (ref 0.57–1.00)
Glucose: 92 mg/dL (ref 70–99)
Potassium: 3.8 mmol/L (ref 3.5–5.2)
Sodium: 135 mmol/L (ref 134–144)
eGFR: 80 mL/min/{1.73_m2} (ref >59)

## 2021-12-17 LAB — CBC
Hematocrit: 34.8 % (ref 34.0–46.6)
Hemoglobin: 11.9 g/dL (ref 11.1–15.9)
MCH: 30.9 pg (ref 26.6–33.0)
MCHC: 34.2 g/dL (ref 31.5–35.7)
MCV: 90 fL (ref 79–97)
Platelets: 194 10*3/uL (ref 150–450)
RBC: 3.85 x10E6/uL (ref 3.77–5.28)
RDW: 13.5 % (ref 11.7–15.4)
WBC: 4.3 10*3/uL (ref 3.4–10.8)

## 2021-12-17 NOTE — Telephone Encounter (Signed)
Pt called and had questions regarding what medications to take for her A-FIB Ablation on 12/26/2021, and her CT Scan appointment.  Pt was told not to miss any doses of her Xarelto prior to the morning of her procedure or the procedure will need to be rescheduled.  ( Info per the letter she received. )  Pt also told NOT to take ANY MEDICATIONS the morning of her Ablation.  Pt understood teaching.     Pt was instructed to take her Lopressor two hours prior to her CT Scan... Info per letter received.  Pt told to take / continue her Xarelto and NOT hold it. Pt also told to hold any Furosemide / Hydrochlorothiazide per letter, as these medications will interact with the contrast dye that will be used for the CT Scan.    Pt advised to drink plenty of water before and after the CT Scan to wash the excess contrast dye out of her cardiovascular system.  Information also stated in the letter she received.    Pt understood all education, but also told to contact us if any new questions or concerns arise.  No f/u required.

## 2021-12-17 NOTE — Telephone Encounter (Signed)
Left message for patient to call me back 12/17/21 at 419 pm

## 2021-12-17 NOTE — Telephone Encounter (Signed)
Patient calling in with question concern medication for the procedure

## 2021-12-17 NOTE — Telephone Encounter (Signed)
Patient returned RN's call.  Tried to contact RN but got no response. 

## 2021-12-20 ENCOUNTER — Telehealth (HOSPITAL_COMMUNITY): Payer: Self-pay | Admitting: *Deleted

## 2021-12-20 NOTE — Telephone Encounter (Signed)
Reaching out to patient to offer assistance regarding upcoming cardiac imaging study; pt verbalizes understanding of appt date/time, parking situation and where to check in, pre-test NPO status and verified current allergies; name and call back number provided for further questions should they arise  Larey Brick RN Navigator Cardiac Imaging [facility] [phone] office [phone] cell  Patient to take her daily medications. She is aware to arrive at 7:30am.

## 2021-12-24 ENCOUNTER — Ambulatory Visit (HOSPITAL_COMMUNITY)
Admission: RE | Admit: 2021-12-24 | Discharge: 2021-12-24 | Disposition: A | Discharge status: Home / Self Care | Payer: Medicare Other | Source: Ambulatory Visit | Attending: Cardiology | Admitting: Cardiology

## 2021-12-24 DIAGNOSIS — I480 Paroxysmal atrial fibrillation: Secondary | ICD-10-CM | POA: Diagnosis present

## 2021-12-24 MED ORDER — IOHEXOL 350 MG/ML SOLN
100.0000 mL | Freq: Once | INTRAVENOUS | Status: AC | PRN
  Administered 2021-12-24: 100 mL via INTRAVENOUS

## 2021-12-25 NOTE — Pre-Procedure Instructions (Signed)
Attempted to call patient regarding procedure instructions fro tomorrow.  Left voice mail on the following items: Arrival time 0630 Nothing to eat or drink after midnight No meds AM of procedure Responsible person to drive you home and stay with you for 24 hrs  Have you missed any doses of anti-coagulant Xarelto- be sure to take dose today, if you have missed any doses please let us know,

## 2021-12-26 ENCOUNTER — Other Ambulatory Visit: Payer: Self-pay

## 2021-12-26 ENCOUNTER — Ambulatory Visit (HOSPITAL_COMMUNITY)
Admission: RE | Disposition: A | Discharge status: Home / Self Care | Payer: Self-pay | Source: Ambulatory Visit | Attending: Cardiology

## 2021-12-26 ENCOUNTER — Ambulatory Visit (HOSPITAL_COMMUNITY)
Admission: RE | Admit: 2021-12-26 | Discharge: 2021-12-26 | Disposition: A | Discharge status: Home / Self Care | Payer: Medicare Other | Source: Ambulatory Visit | Attending: Cardiology | Admitting: Cardiology

## 2021-12-26 ENCOUNTER — Ambulatory Visit (HOSPITAL_BASED_OUTPATIENT_CLINIC_OR_DEPARTMENT_OTHER): Payer: Medicare Other | Admitting: Anesthesiology

## 2021-12-26 ENCOUNTER — Ambulatory Visit (HOSPITAL_COMMUNITY): Payer: Medicare Other | Admitting: Anesthesiology

## 2021-12-26 DIAGNOSIS — I2510 Atherosclerotic heart disease of native coronary artery without angina pectoris: Secondary | ICD-10-CM | POA: Diagnosis not present

## 2021-12-26 DIAGNOSIS — I4891 Unspecified atrial fibrillation: Secondary | ICD-10-CM | POA: Diagnosis not present

## 2021-12-26 DIAGNOSIS — I480 Paroxysmal atrial fibrillation: Secondary | ICD-10-CM

## 2021-12-26 DIAGNOSIS — Z955 Presence of coronary angioplasty implant and graft: Secondary | ICD-10-CM | POA: Insufficient documentation

## 2021-12-26 DIAGNOSIS — Z87891 Personal history of nicotine dependence: Secondary | ICD-10-CM | POA: Insufficient documentation

## 2021-12-26 DIAGNOSIS — I10 Essential (primary) hypertension: Secondary | ICD-10-CM | POA: Diagnosis not present

## 2021-12-26 HISTORY — PX: ATRIAL FIBRILLATION ABLATION: EP1191

## 2021-12-26 LAB — POCT ACTIVATED CLOTTING TIME
Activated Clotting Time: 293 seconds
Activated Clotting Time: 341 seconds

## 2021-12-26 SURGERY — ATRIAL FIBRILLATION ABLATION
Anesthesia: General

## 2021-12-26 MED ORDER — SODIUM CHLORIDE 0.9 % IV SOLN: INTRAVENOUS | Status: DC

## 2021-12-26 MED ORDER — DOBUTAMINE INFUSION FOR EP/ECHO/NUC (1000 MCG/ML)
INTRAVENOUS | Status: DC | PRN
  Administered 2021-12-26: 10 ug/kg/min via INTRAVENOUS

## 2021-12-26 MED ORDER — PHENYLEPHRINE HCL-NACL 20-0.9 MG/250ML-% IV SOLN
INTRAVENOUS | Status: DC | PRN
  Administered 2021-12-26: 25 ug/min via INTRAVENOUS

## 2021-12-26 MED ORDER — PROTAMINE SULFATE 10 MG/ML IV SOLN
INTRAVENOUS | Status: DC | PRN
  Administered 2021-12-26: 40 mg via INTRAVENOUS

## 2021-12-26 MED ORDER — HEPARIN SODIUM (PORCINE) 1000 UNIT/ML IJ SOLN
INTRAMUSCULAR | Status: AC
  Filled 2021-12-26: qty 10

## 2021-12-26 MED ORDER — LIDOCAINE 2% (20 MG/ML) 5 ML SYRINGE
INTRAMUSCULAR | Status: DC | PRN
  Administered 2021-12-26: 60 mg via INTRAVENOUS

## 2021-12-26 MED ORDER — DOBUTAMINE INFUSION FOR EP/ECHO/NUC (1000 MCG/ML)
INTRAVENOUS | Status: AC
Start: 2021-12-26 — End: ?
  Filled 2021-12-26: qty 250

## 2021-12-26 MED ORDER — HEPARIN SODIUM (PORCINE) 1000 UNIT/ML IJ SOLN
INTRAMUSCULAR | Status: DC | PRN
  Administered 2021-12-26: 1000 [IU] via INTRAVENOUS
  Administered 2021-12-26: 3000 [IU] via INTRAVENOUS
  Administered 2021-12-26: 14000 [IU] via INTRAVENOUS

## 2021-12-26 MED ORDER — PROPOFOL 10 MG/ML IV BOLUS
INTRAVENOUS | Status: DC | PRN
  Administered 2021-12-26: 40 mg via INTRAVENOUS
  Administered 2021-12-26: 160 mg via INTRAVENOUS

## 2021-12-26 MED ORDER — DEXAMETHASONE SODIUM PHOSPHATE 10 MG/ML IJ SOLN
INTRAMUSCULAR | Status: DC | PRN
  Administered 2021-12-26: 10 mg via INTRAVENOUS

## 2021-12-26 MED ORDER — ROCURONIUM BROMIDE 10 MG/ML (PF) SYRINGE
PREFILLED_SYRINGE | INTRAVENOUS | Status: DC | PRN
  Administered 2021-12-26: 70 mg via INTRAVENOUS

## 2021-12-26 MED ORDER — HEPARIN (PORCINE) IN NACL 1000-0.9 UT/500ML-% IV SOLN
INTRAVENOUS | Status: AC
  Filled 2021-12-26: qty 500

## 2021-12-26 MED ORDER — HEPARIN SODIUM (PORCINE) 1000 UNIT/ML IJ SOLN
INTRAMUSCULAR | Status: DC | PRN
  Administered 2021-12-26: 1000 [IU] via INTRAVENOUS

## 2021-12-26 MED ORDER — SUGAMMADEX SODIUM 200 MG/2ML IV SOLN
INTRAVENOUS | Status: DC | PRN
  Administered 2021-12-26 (×2): 100 mg via INTRAVENOUS

## 2021-12-26 MED ORDER — HEPARIN (PORCINE) IN NACL 1000-0.9 UT/500ML-% IV SOLN
INTRAVENOUS | Status: DC | PRN
  Administered 2021-12-26 (×3): 500 mL

## 2021-12-26 MED ORDER — ONDANSETRON HCL 4 MG/2ML IJ SOLN
INTRAMUSCULAR | Status: DC | PRN
  Administered 2021-12-26: 4 mg via INTRAVENOUS

## 2021-12-26 MED ORDER — FENTANYL CITRATE (PF) 250 MCG/5ML IJ SOLN
INTRAMUSCULAR | Status: DC | PRN
  Administered 2021-12-26 (×2): 50 ug via INTRAVENOUS

## 2021-12-26 SURGICAL SUPPLY — 16 items
CATH ABLAT QDOT MICRO BI TC DF (CATHETERS) ×2 IMPLANT
CATH OCTARAY 2.0 F 3-3-3-3-3 (CATHETERS) ×1 IMPLANT
CATH S CIRCA THERM PROBE 10F (CATHETERS) ×1 IMPLANT
CATH SOUNDSTAR ECO 8FR (CATHETERS) ×1 IMPLANT
CATH WEBSTER BI DIR CS D-F CRV (CATHETERS) ×1 IMPLANT
CLOSURE PERCLOSE PROSTYLE (VASCULAR PRODUCTS) ×4 IMPLANT
COVER SWIFTLINK CONNECTOR (BAG) ×2 IMPLANT
KIT VERSACROSS STEERABLE D1 (CATHETERS) ×1 IMPLANT
PACK EP LATEX FREE (CUSTOM PROCEDURE TRAY) ×2
PACK EP LF (CUSTOM PROCEDURE TRAY) ×1 IMPLANT
PAD DEFIB RADIO PHYSIO CONN (PAD) ×2 IMPLANT
SHEATH CARTO VIZIGO SM CVD (SHEATH) ×1 IMPLANT
SHEATH PINNACLE 7F 10CM (SHEATH) ×1 IMPLANT
SHEATH PINNACLE 8F 10CM (SHEATH) ×2 IMPLANT
SHEATH PINNACLE 9F 10CM (SHEATH) ×1 IMPLANT
TUBING SMART ABLATE COOLFLOW (TUBING) ×1 IMPLANT

## 2021-12-26 NOTE — Anesthesia Procedure Notes (Signed)
Procedure Name: Intubation Date/Time: 12/26/2021 8:43 AM  Performed by: Kyung Rudd, CRNAPre-anesthesia Checklist: Patient identified, Emergency Drugs available, Suction available and Patient being monitored Patient Re-evaluated:Patient Re-evaluated prior to induction Oxygen Delivery Method: Circle system utilized Preoxygenation: Pre-oxygenation with 100% oxygen Induction Type: IV induction Ventilation: Mask ventilation without difficulty Laryngoscope Size: Mac and 3 Grade View: Grade I Tube type: Oral Tube size: 7.0 mm Number of attempts: 1 Airway Equipment and Method: Stylet Placement Confirmation: ETT inserted through vocal cords under direct vision, positive ETCO2 and breath sounds checked- equal and bilateral Secured at: 21 cm Tube secured with: Tape Dental Injury: Teeth and Oropharynx as per pre-operative assessment

## 2021-12-26 NOTE — Discharge Instructions (Signed)

## 2021-12-26 NOTE — Transfer of Care (Signed)
Immediate Anesthesia Transfer of Care Note  Patient: Corlette Myasia Sinatra  Procedure(s) Performed: ATRIAL FIBRILLATION ABLATION  Patient Location: Cath Lab  Anesthesia Type:General  Level of Consciousness: awake, alert  and oriented  Airway & Oxygen Therapy: Patient Spontanous Breathing and Patient connected to nasal cannula oxygen  Post-op Assessment: Report given to RN, Post -op Vital signs reviewed and stable and Patient moving all extremities X 4  Post vital signs: Reviewed and stable  Last Vitals:  Vitals Value Taken Time  BP 121/56 12/26/21 1043  Temp 36.8 C 12/26/21 1043  Pulse 89 12/26/21 1045  Resp 25 12/26/21 1045  SpO2 95 % 12/26/21 1045  Vitals shown include unvalidated device data.  Last Pain:  Vitals:   12/26/21 1043  TempSrc: Temporal  PainSc: Asleep         Complications: There were no known notable events for this encounter.

## 2021-12-26 NOTE — Anesthesia Postprocedure Evaluation (Signed)
Anesthesia Post Note  Patient: Clydette Privitera  Procedure(s) Performed: ATRIAL FIBRILLATION ABLATION     Patient location during evaluation: Cath Lab Anesthesia Type: General Level of consciousness: awake and alert Pain management: pain level controlled Vital Signs Assessment: post-procedure vital signs reviewed and stable Respiratory status: spontaneous breathing, nonlabored ventilation and respiratory function stable Cardiovascular status: blood pressure returned to baseline and stable Postop Assessment: no apparent nausea or vomiting Anesthetic complications: no   There were no known notable events for this encounter.  Last Vitals:  Vitals:   12/26/21 1330 12/26/21 1400  BP:  121/69  Pulse: 78 79  Resp: 12 15  Temp:    SpO2: 96% 96%    Last Pain:  Vitals:   12/26/21 1400  TempSrc:   PainSc: 0-No pain                 Angelino Rumery

## 2021-12-26 NOTE — H&P (Signed)
Electrophysiology Office Note   Date:  12/26/2021   ID:  Colleen Cole, DOB 12-17-1945, MRN [id number]  PCP:  Aliene Beams, MD  Cardiologist:  Anne Fu Primary Electrophysiologist:  Brandyce Dimario Jorja Loa, MD    Chief Complaint: AF   History of Present Illness: Colleen Cole is a 76 y.o. female who is being seen today for the evaluation of AF at the request of No ref. provider found. Presenting today for electrophysiology evaluation.  He has a history significant for atrial fibrillation, hypertension, coronary artery disease.  She is now status post LAD stent.  She has intermittent episodes of atrial fibrillation initially treated with metoprolol.  She went into atrial fibrillation today and came in the clinic.  She has been having more frequent episodes of atrial fibrillation causing her to feel quite fatigued, short of breath.  She would prefer a rhythm control strategy.  Today, denies symptoms of palpitations, chest pain, shortness of breath, orthopnea, PND, lower extremity edema, claudication, dizziness, presyncope, syncope, bleeding, or neurologic sequela. The patient is tolerating medications without difficulties. Plan ablation today.    Past Medical History:  Diagnosis Date   Hypertension    Past Surgical History:  Procedure Laterality Date   BREAST EXCISIONAL BIOPSY Right 1995   NEG   CORONARY STENT INTERVENTION N/A 08/27/2021   Procedure: CORONARY STENT INTERVENTION;  Surgeon: Tonny Bollman, MD;  Location: [facility] INVASIVE CV LAB;  Service: Cardiovascular;  Laterality: N/A;   FRACTURE SURGERY  2007   fracture in left leg-at patella, tibia and fibula   INTRAVASCULAR PRESSURE WIRE/FFR STUDY N/A 08/27/2021   Procedure: INTRAVASCULAR PRESSURE WIRE/FFR STUDY;  Surgeon: Tonny Bollman, MD;  Location: [facility] INVASIVE CV LAB;  Service: Cardiovascular;  Laterality: N/A;   LEFT HEART CATH AND CORONARY ANGIOGRAPHY N/A 08/27/2021   Procedure: LEFT HEART CATH AND CORONARY ANGIOGRAPHY;   Surgeon: Tonny Bollman, MD;  Location: [facility] INVASIVE CV LAB;  Service: Cardiovascular;  Laterality: N/A;   TOTAL HIP ARTHROPLASTY Right 08/04/2018   Procedure: TOTAL HIP ARTHROPLASTY ANTERIOR APPROACH;  Surgeon: Marcene Corning, MD;  Location: [facility] ORS;  Service: Orthopedics;  Laterality: Right;     Current Facility-Administered Medications  Medication Dose Route Frequency Provider Last Rate Last Admin   0.9 %  sodium chloride infusion   Intravenous Continuous Regan Lemming, MD 50 mL/hr at 12/26/21 0630 New Bag at 12/26/21 0630    Allergies:   Patient has no known allergies.   Social History:  The patient  reports that she quit smoking about 23 years ago. Her smoking use included cigarettes. She has never used smokeless tobacco. She reports current alcohol use. She reports that she does not use drugs.   Family History:  The patient's family history is not on file.   ROS:  Please see the history of present illness.   Otherwise, review of systems is positive for none.   All other systems are reviewed and negative.   PHYSICAL EXAM: VS:  BP 139/67   Pulse 64   Temp 98 F (36.7 C) (Temporal)   Resp 16   Ht 5\' 4"  (1.626 m)   Wt 81.6 kg   SpO2 96%   BMI 30.90 kg/m  , BMI Body mass index is 30.9 kg/m. GEN: Well nourished, well developed, in no acute distress  HEENT: normal  Neck: no JVD, carotid bruits, or masses Cardiac: RRR; no murmurs, rubs, or gallops,no edema  Respiratory:  clear to auscultation bilaterally, normal work of breathing GI: soft, nontender, nondistended, +  BS MS: no deformity or atrophy  Skin: warm and dry Neuro:  Strength and sensation are intact Psych: euthymic mood, full affect    Recent Labs: 06/12/2021: ALT 14 12/17/2021: BUN 10; Creatinine, Ser 0.78; Hemoglobin 11.9; Platelets 194; Potassium 3.8; Sodium 135    Lipid Panel     Component Value Date/Time   CHOL 151 06/12/2021 0814   TRIG 63 06/12/2021 0814   HDL 81 06/12/2021 0814   CHOLHDL 1.9  06/12/2021 0814   LDLCALC 57 06/12/2021 0814     Wt Readings from Last 3 Encounters:  12/26/21 81.6 kg  12/05/21 84 kg  12/03/21 83.9 kg      Other studies Reviewed: Additional studies/ records that were reviewed today include: TTE 08/21/21  Review of the above records today demonstrates:   1. Left ventricular ejection fraction, by estimation, is 60 to 65%. The  left ventricle has normal function. The left ventricle has no regional  wall motion abnormalities. Left ventricular diastolic parameters were  normal. The average left ventricular  global longitudinal strain is 27.3 %. The global longitudinal strain is  normal.   2. Right ventricular systolic function is normal. The right ventricular  size is normal. There is normal pulmonary artery systolic pressure. The  estimated right ventricular systolic pressure is 26.8 mmHg.   3. Left atrial size was moderately dilated.   4. The mitral valve is normal in structure. Trivial mitral valve  regurgitation. No evidence of mitral stenosis.   5. The aortic valve is tricuspid. Aortic valve regurgitation is not  visualized. No aortic stenosis is present.   6. The inferior vena cava is normal in size with greater than 50%  respiratory variability, suggesting right atrial pressure of 3 mmHg.   LHC 08/27/21 Severe stenosis of the LAD just after the first septal perforator, treated successfully with pressure wire guided PCI using a 2.5 x 20 mm Synergy DES Widely patent left main, left circumflex, and RCA with mild nonobstructive plaquing  ASSESSMENT AND PLAN:  1.  Paroxysmal atrial fibrillation: Colleen Cole has presented today for surgery, with the diagnosis of atrial fibrillation.  The various methods of treatment have been discussed with the patient and family. After consideration of risks, benefits and other options for treatment, the patient has consented to  Procedure(s): Catheter ablation as a surgical intervention .  Risks include  but not limited to complete heart block, stroke, esophageal damage, nerve damage, bleeding, vascular damage, tamponade, perforation, MI, and death. The patient's history has been reviewed, patient examined, no change in status, stable for surgery.  I have reviewed the patient's chart and labs.  Questions were answered to the patient's satisfaction.    Breonna Gafford Elberta Fortis, MD 12/26/2021 7:16 AM

## 2021-12-26 NOTE — Anesthesia Preprocedure Evaluation (Signed)
Anesthesia Evaluation  Patient identified by MRN, date of birth, ID band Patient awake    Reviewed: Allergy & Precautions, NPO status , Patient's Chart, lab work & pertinent test results  History of Anesthesia Complications Negative for: history of anesthetic complications  Airway Mallampati: III  TM Distance: <3 FB Neck ROM: Full    Dental  (+) Teeth Intact, Dental Advisory Given   Pulmonary neg shortness of breath, sleep apnea , neg COPD, neg recent URI, former smoker,    breath sounds clear to auscultation       Cardiovascular hypertension, (-) angina+ CAD and + Cardiac Stents   Rhythm:Regular  1. Left ventricular ejection fraction, by estimation, is 60 to 65%. The  left ventricle has normal function. The left ventricle has no regional  wall motion abnormalities. Left ventricular diastolic parameters were  normal. The average left ventricular  global longitudinal strain is 27.3 %. The global longitudinal strain is  normal.  2. Right ventricular systolic function is normal. The right ventricular  size is normal. There is normal pulmonary artery systolic pressure. The  estimated right ventricular systolic pressure is 26.8 mmHg.  3. Left atrial size was moderately dilated.  4. The mitral valve is normal in structure. Trivial mitral valve  regurgitation. No evidence of mitral stenosis.  5. The aortic valve is tricuspid. Aortic valve regurgitation is not  visualized. No aortic stenosis is present.  6. The inferior vena cava is normal in size with greater than 50%  respiratory variability, suggesting right atrial pressure of 3 mmHg.   1. Severe stenosis of the LAD just after the first septal perforator, treated successfully with pressure wire guided PCI using a 2.5 x 20 mm Synergy DES 2. Widely patent left main, left circumflex, and RCA with mild nonobstructive plaquing  Recommendations: Continue clopidogrel x6 months,  resume rivaroxaban tomorrow.  Would avoid aspirin in the setting of chronic oral anticoagulation plus clopidogrel in order to avoid excessive bleeding risk.    Neuro/Psych negative neurological ROS  negative psych ROS   GI/Hepatic negative GI ROS, Neg liver ROS,   Endo/Other  negative endocrine ROS  Renal/GU negative Renal ROS     Musculoskeletal  (+) Arthritis ,   Abdominal   Peds  Hematology  (+) Blood dyscrasia, , Lab Results      Component                Value               Date                      WBC                      4.3                 12/17/2021                HGB                      11.9                12/17/2021                HCT                      34.8                12/17/2021  MCV                      90                  12/17/2021                PLT                      194                 12/17/2021            plavix   Anesthesia Other Findings   Reproductive/Obstetrics                             Anesthesia Physical Anesthesia Plan  ASA: 2  Anesthesia Plan: General   Post-op Pain Management: Minimal or no pain anticipated   Induction: Intravenous  PONV Risk Score and Plan: 3 and Ondansetron, Dexamethasone and Treatment may vary due to age or medical condition  Airway Management Planned: Oral ETT  Additional Equipment: None  Intra-op Plan:   Post-operative Plan: Extubation in OR  Informed Consent: I have reviewed the patients History and Physical, chart, labs and discussed the procedure including the risks, benefits and alternatives for the proposed anesthesia with the patient or authorized representative who has indicated his/her understanding and acceptance.     Dental advisory given  Plan Discussed with: CRNA  Anesthesia Plan Comments:         Anesthesia Quick Evaluation

## 2021-12-27 ENCOUNTER — Encounter (HOSPITAL_COMMUNITY): Payer: Self-pay | Admitting: Cardiology

## 2021-12-27 MED FILL — Heparin Sod (Porcine)-NaCl IV Soln 1000 Unit/500ML-0.9%: INTRAVENOUS | Qty: 500 | Status: AC

## 2022-01-28 ENCOUNTER — Ambulatory Visit (HOSPITAL_COMMUNITY)
Admission: RE | Admit: 2022-01-28 | Discharge: 2022-01-28 | Disposition: A | Discharge status: Home / Self Care | Payer: Medicare Other | Source: Ambulatory Visit | Attending: Physician Assistant | Admitting: Physician Assistant

## 2022-01-28 ENCOUNTER — Encounter (HOSPITAL_COMMUNITY): Payer: Self-pay | Admitting: Physician Assistant

## 2022-01-28 VITALS — BP 118/68 | HR 63 | Ht 64.0 in | Wt 187.8 lb

## 2022-01-28 DIAGNOSIS — Z6832 Body mass index (BMI) 32.0-32.9, adult: Secondary | ICD-10-CM | POA: Insufficient documentation

## 2022-01-28 DIAGNOSIS — D6869 Other thrombophilia: Secondary | ICD-10-CM | POA: Diagnosis not present

## 2022-01-28 DIAGNOSIS — I2510 Atherosclerotic heart disease of native coronary artery without angina pectoris: Secondary | ICD-10-CM | POA: Insufficient documentation

## 2022-01-28 DIAGNOSIS — I480 Paroxysmal atrial fibrillation: Secondary | ICD-10-CM

## 2022-01-28 DIAGNOSIS — Z7901 Long term (current) use of anticoagulants: Secondary | ICD-10-CM | POA: Diagnosis not present

## 2022-01-28 DIAGNOSIS — I10 Essential (primary) hypertension: Secondary | ICD-10-CM | POA: Diagnosis not present

## 2022-01-28 DIAGNOSIS — Z955 Presence of coronary angioplasty implant and graft: Secondary | ICD-10-CM | POA: Diagnosis not present

## 2022-01-28 DIAGNOSIS — E669 Obesity, unspecified: Secondary | ICD-10-CM | POA: Diagnosis not present

## 2022-01-28 MED ORDER — AMIODARONE HCL 200 MG PO TABS: 100.0000 mg | ORAL_TABLET | Freq: Every day | ORAL | 3 refills | Status: DC

## 2022-01-28 NOTE — Progress Notes (Signed)
Primary Care Physician: Aliene Beams, MD Primary Cardiologist: Dr Anne Fu Primary Electrophysiologist: Dr Elberta Fortis Referring Physician: Dr Gerda Diss Colleen Cole is a 76 y.o. female with a history of CAD, HTN, HLD, atrial fibrillation who presents for follow up in the [facility].  Patient has been maintained on amiodarone and underwent afib ablation 12/26/21. Patient is on Xarelto for a CHADS2VASC score of 4. She states that she has not had any afib since the ablation, "not even a flutter". No CP, swallowing pain, or groin issues. She does not slow heart rates at home associated with fatigue and SOB.  Today, she denies symptoms of palpitations, chest pain, orthopnea, PND, lower extremity edema, dizziness, presyncope, syncope, snoring, daytime somnolence, bleeding, or neurologic sequela. The patient is tolerating medications without difficulties and is otherwise without complaint today.    Atrial Fibrillation Risk Factors:  she does not have symptoms or diagnosis of sleep apnea. she does not have a history of rheumatic fever.   she has a BMI of Body mass index is 32.24 kg/m.Marland Kitchen Filed Weights   01/28/22 1122  Weight: 85.2 kg    Family History  Problem Relation Age of Onset   Breast cancer Neg Hx      Atrial Fibrillation Management history:  Previous antiarrhythmic drugs: amiodarone  Previous cardioversions: none Previous ablations: 12/26/21 CHADS2VASC score: 4 Anticoagulation history: Xarelto    Past Medical History:  Diagnosis Date   Hypertension    Past Surgical History:  Procedure Laterality Date   ATRIAL FIBRILLATION ABLATION N/A 12/26/2021   Procedure: ATRIAL FIBRILLATION ABLATION;  Surgeon: Regan Lemming, MD;  Location: [facility] INVASIVE CV LAB;  Service: Cardiovascular;  Laterality: N/A;   BREAST EXCISIONAL BIOPSY Right 1995   NEG   CORONARY STENT INTERVENTION N/A 08/27/2021   Procedure: CORONARY STENT INTERVENTION;  Surgeon:  Tonny Bollman, MD;  Location: [facility] INVASIVE CV LAB;  Service: Cardiovascular;  Laterality: N/A;   FRACTURE SURGERY  2007   fracture in left leg-at patella, tibia and fibula   INTRAVASCULAR PRESSURE WIRE/FFR STUDY N/A 08/27/2021   Procedure: INTRAVASCULAR PRESSURE WIRE/FFR STUDY;  Surgeon: Tonny Bollman, MD;  Location: [facility] INVASIVE CV LAB;  Service: Cardiovascular;  Laterality: N/A;   LEFT HEART CATH AND CORONARY ANGIOGRAPHY N/A 08/27/2021   Procedure: LEFT HEART CATH AND CORONARY ANGIOGRAPHY;  Surgeon: Tonny Bollman, MD;  Location: [facility] INVASIVE CV LAB;  Service: Cardiovascular;  Laterality: N/A;   TOTAL HIP ARTHROPLASTY Right 08/04/2018   Procedure: TOTAL HIP ARTHROPLASTY ANTERIOR APPROACH;  Surgeon: Marcene Corning, MD;  Location: [facility] ORS;  Service: Orthopedics;  Laterality: Right;    Current Outpatient Medications  Medication Sig Dispense Refill   acetaminophen (TYLENOL) 500 MG tablet Take 1,000 mg by mouth every 6 (six) hours as needed for mild pain or headache.     alendronate (FOSAMAX) 70 MG tablet Take 70 mg by mouth every Sunday.     amiodarone (PACERONE) 200 MG tablet Take (1) tablet twice a day for (2) weeks then decrease to (1) tablet daily 105 tablet 3   amLODipine (NORVASC) 5 MG tablet Take 10 mg by mouth daily.     Calcium Carb-Cholecalciferol (CALCIUM 600 + D PO) Take 1 tablet by mouth 2 (two) times daily.     cholecalciferol (VITAMIN D3) 25 MCG (1000 UNIT) tablet Take 1,000 Units by mouth daily.     clopidogrel (PLAVIX) 75 MG tablet Take 1 tablet (75 mg total) by mouth daily. 90 tablet 1   Docusate  Calcium (STOOL SOFTENER PO) Take 2-3 capsules by mouth every evening.     escitalopram (LEXAPRO) 5 MG tablet Take 5 mg by mouth daily.     fluticasone (FLONASE) 50 MCG/ACT nasal spray Place 1 spray into both nostrils daily as needed for allergies or rhinitis.     hydrALAZINE (APRESOLINE) 25 MG tablet Take 1 tablet (25 mg total) by mouth 2 (two) times daily. 180 tablet 3    hydrochlorothiazide (HYDRODIURIL) 25 MG tablet Take 25 mg by mouth daily.     levocetirizine (XYZAL) 5 MG tablet Take 5 mg by mouth every evening.     losartan (COZAAR) 100 MG tablet Take 100 mg by mouth every evening.     metoprolol tartrate (LOPRESSOR) 25 MG tablet TAKE 1 TABLET(25 MG) BY MOUTH TWICE DAILY 180 tablet 1   nitroGLYCERIN (NITROSTAT) 0.4 MG SL tablet Place 1 tablet (0.4 mg total) under the tongue every 5 (five) minutes as needed. 25 tablet 2   rosuvastatin (CRESTOR) 20 MG tablet Take 1 tablet (20 mg total) by mouth daily. 90 tablet 3   senna (SENOKOT) 8.6 MG TABS tablet Take 2-3 tablets by mouth every evening.     XARELTO 20 MG TABS tablet Take 1 tablet (20 mg total) by mouth daily. 90 tablet 3   No current facility-administered medications for this encounter.    No Known Allergies  Social History   Socioeconomic History   Marital status: Single    Spouse name: Not on file   Number of children: Not on file   Years of education: Not on file   Highest education level: Not on file  Occupational History   Not on file  Tobacco Use   Smoking status: Former    Types: Cigarettes    Quit date: 07/13/1998    Years since quitting: 23.5   Smokeless tobacco: Never   Tobacco comments:    Former smoker 01/28/22  Vaping Use   Vaping Use: Never used  Substance and Sexual Activity   Alcohol use: Yes    Alcohol/week: 2.0 standard drinks of alcohol    Types: 2 Standard drinks or equivalent per week    Comment: 2 glasses of wine weekly 01/28/22   Drug use: Never   Sexual activity: Not on file  Other Topics Concern   Not on file  Social History Narrative   Not on file   Social Determinants of Health   Financial Resource Strain: Not on file  Food Insecurity: Not on file  Transportation Needs: Not on file  Physical Activity: Not on file  Stress: Not on file  Social Connections: Not on file  Intimate Partner Violence: Not on file     ROS- All systems are reviewed and  negative except as per the HPI above.  Physical Exam: Vitals:   01/28/22 1122  BP: 118/68  Pulse: 63  Weight: 85.2 kg  Height: 5\' 4"  (1.626 m)    GEN- The patient is a well appearing obese female, alert and oriented x 3 today.   Head- normocephalic, atraumatic Eyes-  Sclera clear, conjunctiva pink Ears- hearing intact Oropharynx- clear Neck- supple  Lungs- Clear to ausculation bilaterally, normal work of breathing Heart- Regular rate and rhythm, no murmurs, rubs or gallops  GI- soft, NT, ND, + BS Extremities- no clubbing, cyanosis, or edema MS- no significant deformity or atrophy Skin- no rash or lesion Psych- euthymic mood, full affect Neuro- strength and sensation are intact  Wt Readings from Last 3 Encounters:  01/28/22  85.2 kg  12/26/21 81.6 kg  12/05/21 84 kg    EKG today demonstrates  SR Vent. rate 63 BPM PR interval 152 ms QRS duration 76 ms QT/QTcB 440/450 ms  Echo 08/21/21 demonstrated   1. Left ventricular ejection fraction, by estimation, is 60 to 65%. The  left ventricle has normal function. The left ventricle has no regional  wall motion abnormalities. Left ventricular diastolic parameters were  normal. The average left ventricular global longitudinal strain is 27.3 %. The global longitudinal strain is normal.   2. Right ventricular systolic function is normal. The right ventricular  size is normal. There is normal pulmonary artery systolic pressure. The  estimated right ventricular systolic pressure is 26.8 mmHg.   3. Left atrial size was moderately dilated.   4. The mitral valve is normal in structure. Trivial mitral valve  regurgitation. No evidence of mitral stenosis.   5. The aortic valve is tricuspid. Aortic valve regurgitation is not  visualized. No aortic stenosis is present.   6. The inferior vena cava is normal in size with greater than 50%  respiratory variability, suggesting right atrial pressure of 3 mmHg.  Epic records are reviewed at  length today  CHA2DS2-VASc Score = 4  The patient's score is based upon: CHF History: 0 HTN History: 1 Diabetes History: 0 Stroke History: 0 Vascular Disease History: 1 Age Score: 1 Gender Score: 1       ASSESSMENT AND PLAN: 1. Paroxysmal Atrial Fibrillation (ICD10:  I48.0) The patient's CHA2DS2-VASc score is 4, indicating a 4.8% annual risk of stroke.   S/p afib ablation 12/26/21 Patient appears to be maintaining SR. Will decrease amiodarone to 100 mg BID. Hopefully this medication will be short term post ablation.  Continue Xarelto 20 mg daily with no missed doses for 3 months post ablation.  Continue Lopressor 25 mg BID  2. Secondary Hypercoagulable State (ICD10:  D68.69) The patient is at significant risk for stroke/thromboembolism based upon her CHA2DS2-VASc Score of 4.  Continue Rivaroxaban (Xarelto).   3. Obesity Body mass index is 32.24 kg/m. Lifestyle modification was discussed at length including regular exercise and weight reduction.  4. HTN Stable, no changes today.  5. CAD S/p LAD stent No anginal symptoms.   Follow up with Dr Anne Fu and Dr Elberta Fortis as scheduled.    Jorja Loa PA-C [facility] [address] [place], [place] [address] [phone] 01/28/2022 11:29 AM

## 2022-01-28 NOTE — Patient Instructions (Signed)
Decrease Amiodarone to 100mg  once a day (1/2 of your 200mg  tablet)

## 2022-02-15 ENCOUNTER — Other Ambulatory Visit: Payer: Self-pay | Admitting: Cardiology

## 2022-03-02 ENCOUNTER — Other Ambulatory Visit: Payer: Self-pay | Admitting: Cardiology

## 2022-03-13 ENCOUNTER — Other Ambulatory Visit: Payer: Self-pay

## 2022-03-13 MED ORDER — CLOPIDOGREL BISULFATE 75 MG PO TABS: 75.0000 mg | ORAL_TABLET | Freq: Every day | ORAL | 1 refills | Status: DC

## 2022-03-20 ENCOUNTER — Ambulatory Visit
Admission: RE | Admit: 2022-03-20 | Discharge: 2022-03-20 | Disposition: A | Discharge status: Home / Self Care | Payer: Medicare Other | Source: Ambulatory Visit | Attending: Family Medicine | Admitting: Family Medicine

## 2022-03-20 DIAGNOSIS — M810 Age-related osteoporosis without current pathological fracture: Secondary | ICD-10-CM

## 2022-03-20 DIAGNOSIS — Z1231 Encounter for screening mammogram for malignant neoplasm of breast: Secondary | ICD-10-CM

## 2022-04-01 ENCOUNTER — Encounter: Payer: Self-pay | Admitting: Cardiology

## 2022-04-01 ENCOUNTER — Ambulatory Visit: Payer: Medicare Other | Attending: Cardiology | Admitting: Cardiology

## 2022-04-01 ENCOUNTER — Ambulatory Visit (INDEPENDENT_AMBULATORY_CARE_PROVIDER_SITE_OTHER): Payer: Medicare Other | Admitting: Cardiology

## 2022-04-01 VITALS — BP 110/60 | HR 64 | Ht 64.0 in | Wt 183.0 lb

## 2022-04-01 VITALS — BP 112/64 | HR 60 | Ht 64.0 in | Wt 183.0 lb

## 2022-04-01 DIAGNOSIS — I209 Angina pectoris, unspecified: Secondary | ICD-10-CM | POA: Diagnosis not present

## 2022-04-01 DIAGNOSIS — Z7901 Long term (current) use of anticoagulants: Secondary | ICD-10-CM | POA: Insufficient documentation

## 2022-04-01 DIAGNOSIS — D6869 Other thrombophilia: Secondary | ICD-10-CM | POA: Diagnosis present

## 2022-04-01 DIAGNOSIS — I480 Paroxysmal atrial fibrillation: Secondary | ICD-10-CM

## 2022-04-01 DIAGNOSIS — E785 Hyperlipidemia, unspecified: Secondary | ICD-10-CM | POA: Insufficient documentation

## 2022-04-01 DIAGNOSIS — I25119 Atherosclerotic heart disease of native coronary artery with unspecified angina pectoris: Secondary | ICD-10-CM | POA: Insufficient documentation

## 2022-04-01 MED ORDER — METOPROLOL SUCCINATE ER 50 MG PO TB24: 50.0000 mg | ORAL_TABLET | Freq: Every day | ORAL | 3 refills | Status: DC

## 2022-04-01 NOTE — Patient Instructions (Signed)
Medication Instructions:  Please discontinue Plavix and Amiodarone. Continue all other medications as listed.  *If you need a refill on your cardiac medications before your next appointment, please call your pharmacy*  Follow-Up: At [facility], you and your health needs are our priority.  As part of our continuing mission to provide you with exceptional heart care, we have created designated Provider Care Teams.  These Care Teams include your primary Cardiologist (physician) and Advanced Practice Providers (APPs -  Physician Assistants and Nurse Practitioners) who all work together to provide you with the care you need, when you need it.  We recommend signing up for the patient portal called "MyChart".  Sign up information is provided on this After Visit Summary.  MyChart is used to connect with patients for Virtual Visits (Telemedicine).  Patients are able to view lab/test results, encounter notes, upcoming appointments, etc.  Non-urgent messages can be sent to your provider as well.   To learn more about what you can do with MyChart, go to [email or web address].    Your next appointment:   6 month(s)  The format for your next appointment:   In Person  Provider:   Candee Furbish, MD      Important Information About Sugar

## 2022-04-01 NOTE — Progress Notes (Signed)
Cardiology Office Note:    Date:  04/01/2022   ID:  Colleen Cole, DOB [birth year], MRN [id number]  PCP:  Aliene Beams, MD   [facility] Providers Cardiologist:  Donato Schultz, MD     Referring MD: Aliene Beams, MD    History of Present Illness:    Colleen Cole is a 76 y.o. female with PMHx of HTN, HLD, PAF, A-fib, carotid plaque bilaterally, and CAD here for the follow-up of atrial fibrillation and coronary artery disease.   At her appointment 09/06/2021, she complained of immediate shortness of breath and dizziness after standing up. Prompting that visit she had called the office noting she was in atrial fibrillation around 1 PM. Her last episode had been 2 years prior. She was started on amiodarone 200 mg twice daily for 2 weeks, then 200 mg daily thereafter. That same day she was set up for a consult with Dr. Elberta Fortis. Afterwards she wished to plan for ablation.  She called the office 09/10/2021 and reported that she had reverted to sinus rhythm at 65 bpm, on amiodarone 200 mg BID and metoprolol 25 BID at that time.  Previously here for the evaluation of worsening shortness of breath.  She has paroxysmal atrial fibrillation on Xarelto, coronary artery calcification/atherosclerosis, hyperlipidemia, mild carotid plaque bilaterally. Occasionally she will have nuisance bleeding from Xarelto.  Back in July 2020 while at a horse show ([name]in [place] she had an episode of shortness of breath, presyncope and was taken to the emergency room was found to be in atrial fibrillation with rapid ventricular response.  At the time she was treated with IV metoprolol and auto converted.  Lab work was unremarkable at that time.  In 01/11/2020 she had an echocardiogram which showed normal pump function.  A coronary calcium score was performed on 01/11/2020 with a score of 711 and therefore she was started on atorvastatin.  Her LDL was reduced from 97 down to 55.  She was not having any  anginal symptoms at the time of her calcium score.  Her father had CABG in his 77s.  She enjoys golf, very active. She is retired from [employer].  She had COVID 02/2021.  She underwent left heart cath with successful DES placement on 08/27/21. Dr. Excell Seltzer recommended continuing Plavix x6 months and chronic ASA.  At her last visit with me in 10/2021, she was doing well overall and working on improving her strength. She had an ablation on 12/26/2021. She was then seen by PA Clint Fenton on 01/28/2022 and was doing well at that time without any symptoms. Her Amiodarone was decreased to 100mg  once daily at that time.  Today, she states that she has been doing well since our last visit. She denies any recurrence of her A-fib since her ablation. She would like to discontinue Amiodarone if possible.  She remains on Xarelto 20mg  daily and denies any issues with this apart from some easy bruisability. We reviewed the Watchman procedure and indications today as she may want to consider this in the future.  She reports that her blood pressure has been well controlled at home with systolic pressures between 110-115bpm.  She notes difficulty getting her heart rate over 100bpm with activity. She notes that she still feels winded with exercise but she attributes this to working harder to try and get her heart rate up. She has started working with a to improve her exercise tolerance.  Going to .  She denies any palpitations,  chest pain, or peripheral edema. No lightheadedness, headaches, syncope, orthopnea, or PND.   Past Medical History:  Diagnosis Date   Carotid disease, bilateral ([place])    Coronary artery disease    Hyperlipidemia    Hypertension    Paroxysmal A-fib ([place])     Past Surgical History:  Procedure Laterality Date   ATRIAL FIBRILLATION ABLATION N/A 12/26/2021   Procedure: ATRIAL FIBRILLATION ABLATION;  Surgeon: Constance Haw, MD;  Location: [place] [facility];  Service: Cardiovascular;  Laterality: N/A;   BREAST EXCISIONAL BIOPSY Right 1995   NEG   CORONARY STENT INTERVENTION N/A 08/27/2021   Procedure: CORONARY STENT INTERVENTION;  Surgeon: Sherren Mocha, MD;  Location: [place] [facility];  Service: Cardiovascular;  Laterality: N/A;   FRACTURE SURGERY  2007   fracture in left leg-at patella, tibia and fibula   INTRAVASCULAR PRESSURE WIRE/FFR STUDY N/A 08/27/2021   Procedure: INTRAVASCULAR PRESSURE WIRE/FFR STUDY;  Surgeon: Sherren Mocha, MD;  Location: [place] [facility];  Service: Cardiovascular;  Laterality: N/A;   LEFT HEART CATH AND CORONARY ANGIOGRAPHY N/A 08/27/2021   Procedure: LEFT HEART CATH AND CORONARY ANGIOGRAPHY;  Surgeon: Sherren Mocha, MD;  Location: [place] [facility];  Service: Cardiovascular;  Laterality: N/A;   TOTAL HIP ARTHROPLASTY Right 08/04/2018   Procedure: TOTAL HIP ARTHROPLASTY ANTERIOR APPROACH;  Surgeon: Melrose Nakayama, MD;  Location: [facility];  Service: Orthopedics;  Laterality: Right;    Current Medications: Current Meds  Medication Sig   acetaminophen (TYLENOL) 500 MG tablet Take 1,000 mg by mouth every 6 (six) hours as needed for mild pain or headache.   alendronate (FOSAMAX) 70 MG tablet Take 70 mg by mouth every Sunday.   amLODipine (NORVASC) 5 MG tablet Take 10 mg by mouth daily.   Calcium Carb-Cholecalciferol (CALCIUM 600 + D PO) Take 1 tablet by mouth 2 (two) times daily.   cholecalciferol (VITAMIN D3) 25 MCG (1000 UNIT) tablet Take 1,000 Units by mouth daily.   Docusate Calcium (STOOL SOFTENER PO) Take 2-3 capsules by mouth every evening.   escitalopram (LEXAPRO) 5 MG tablet Take 5 mg by mouth daily.   fluticasone (FLONASE) 50 MCG/ACT nasal spray Place 1 spray into both nostrils daily as needed for allergies or rhinitis.   hydrALAZINE (APRESOLINE) 25 MG tablet Take 1 tablet (25 mg total) by mouth 2 (two) times daily. (Patient taking differently: Take 25 mg by mouth 2 (two) times daily. 2  tabs in the am, 1 at noon, 2 at pm)   hydrochlorothiazide (HYDRODIURIL) 25 MG tablet Take 25 mg by mouth daily.   levocetirizine (XYZAL) 5 MG tablet Take 5 mg by mouth every evening.   losartan (COZAAR) 100 MG tablet Take 100 mg by mouth every evening.   metoprolol tartrate (LOPRESSOR) 25 MG tablet TAKE 1 TABLET(25 MG) BY MOUTH TWICE DAILY   nitroGLYCERIN (NITROSTAT) 0.4 MG SL tablet Place 1 tablet (0.4 mg total) under the tongue every 5 (five) minutes as needed.   rosuvastatin (CRESTOR) 20 MG tablet Take 1 tablet (20 mg total) by mouth daily. Pt needs to keep Oct appt for further refills   senna (SENOKOT) 8.6 MG TABS tablet Take 2-3 tablets by mouth every evening.   XARELTO 20 MG TABS tablet Take 1 tablet (20 mg total) by mouth daily.   [DISCONTINUED] amiodarone (PACERONE) 200 MG tablet Take 0.5 tablets (100 mg total) by mouth daily.   [DISCONTINUED] clopidogrel (PLAVIX) 75 MG tablet Take 1 tablet (75 mg total) by mouth daily.  Allergies:   Patient has no known allergies.   Social History   Socioeconomic History   Marital status: Single    Spouse name: Not on file   Number of children: Not on file   Years of education: Not on file   Highest education level: Not on file  Occupational History   Not on file  Tobacco Use   Smoking status: Former    Types: Cigarettes    Quit date: 07/13/1998    Years since quitting: 23.7   Smokeless tobacco: Never   Tobacco comments:    Former smoker 01/28/22  Vaping Use   Vaping Use: Never used  Substance and Sexual Activity   Alcohol use: Yes    Alcohol/week: 2.0 standard drinks of alcohol    Types: 2 Standard drinks or equivalent per week    Comment: 2 glasses of wine weekly 01/28/22   Drug use: Never   Sexual activity: Not on file  Other Topics Concern   Not on file  Social History Narrative   Not on file   Social Determinants of Health   Financial Resource Strain: Not on file  Food Insecurity: Not on file  Transportation Needs:  Not on file  Physical Activity: Not on file  Stress: Not on file  Social Connections: Not on file     Family History: The patient's family history is negative for Breast cancer.  ROS:   Please see the history of present illness.   (+) Easy bruisability  All other systems reviewed and are negative.  EKGs/Labs/Other Studies Reviewed:    The following studies were reviewed today:  Atrial fibrillation ablation 12/26/2021: CONCLUSIONS: 1. Sinus rhythm upon presentation.   2. Successful electrical isolation and anatomical encircling of all four pulmonary veins with radiofrequency current.  A WACA approach was used 3. Additional left atrial ablation was performed with a standard box lesion created along the posterior wall of the left atrium 4. No early apparent complications.   Left Heart Cath 08/27/2021: Severe stenosis of the LAD just after the first septal perforator, treated successfully with pressure wire guided PCI using a 2.5 x 20 mm Synergy DES Widely patent left main, left circumflex, and RCA with mild nonobstructive plaquing   Recommendations: Continue clopidogrel x6 months, resume rivaroxaban tomorrow.  Would avoid aspirin in the setting of chronic oral anticoagulation plus clopidogrel in order to avoid excessive bleeding risk.  Diagnostic: Dominance: Right   Intervention:    Coronary CTA  08/22/2021: FINDINGS: Scan was triggered in the descending thoracic aorta. Axial non-contrast 3 mm slices were carried out through the heart. The data set was analyzed on a dedicated work station and scored using the Agatson method. Gantry rotation speed was 250 msecs and collimation was .6 mm. 0.8 mg of sl NTG was given. The 3D data set was reconstructed in 5% intervals of the 67-82 % of the R-R cycle. Diastolic phases were analyzed on a dedicated work station using MPR, MIP and VRT modes. The patient received 100 cc of contrast.   Aorta:  Normal size.  Aortic atherosclerosis.   No dissection.   Main Pulmonary Artery: Normal size of the pulmonary artery.   Aortic Valve:  Tri-leaflet.  No calcifications.   Coronary Arteries:  Normal coronary origin.  Right dominance.   Coronary Calcium Score:   Left main: 0   Left anterior descending artery: 270   Left circumflex artery: 17   Right coronary artery: 680   Total: 967   Percentile: 95th for  age, sex, and race matched control.   RCA is a large dominant artery that gives rise to PDA and PLA. Mild calcified plaque in the middle and distal vessel.   Left main is a large artery that gives rise to LAD and LCX arteries. Mild distal calcified plaque.   LAD is a large vessel that gives rise to one large D1 Branch. Severe calcified stenosis mid LAD followed by tandem mild calcified lesion. Minimal calcified proximal plaque. Mild calcified plaque in the proximal D1.   LCX is a non-dominant artery that gives rise to one large OM1 branch. Mild soft plaque in proximal OM1.   Other findings:   Normal pulmonary vein drainage into the left atrium.   Normal left atrial appendage without a thrombus.   Extra-cardiac findings: See attached radiology report for non-cardiac structures.   IMPRESSION: 1. Coronary calcium score of 967. This was 95th percentile for age, sex, and race matched control.   2. Normal coronary origin with right dominance.   3. CAD-RADS 4 Severe stenosis. (70-99% or > 50% left main). Cardiac catheterization or CT FFR is recommended. Consider symptom-guided anti-ischemic pharmacotherapy as well as risk factor modification per guideline directed care.  Echocardiogram 08/21/2021:  1. Left ventricular ejection fraction, by estimation, is 60 to 65%. The  left ventricle has normal function. The left ventricle has no regional  wall motion abnormalities. Left ventricular diastolic parameters were  normal. The average left ventricular  global longitudinal strain is 27.3 %. The global  longitudinal strain is  normal.   2. Right ventricular systolic function is normal. The right ventricular  size is normal. There is normal pulmonary artery systolic pressure. The  estimated right ventricular systolic pressure is 26.8 mmHg.   3. Left atrial size was moderately dilated.   4. The mitral valve is normal in structure. Trivial mitral valve  regurgitation. No evidence of mitral stenosis.   5. The aortic valve is tricuspid. Aortic valve regurgitation is not  visualized. No aortic stenosis is present.   6. The inferior vena cava is normal in size with greater than 50%  respiratory variability, suggesting right atrial pressure of 3 mmHg.   Bilateral Carotid Doppler 03/24/2020: Summary:  Right Carotid: Velocities in the right ICA are consistent with a 1-39%  stenosis.   Left Carotid: Velocities in the left ICA are consistent with a 1-39%  stenosis.   Vertebrals:  Bilateral vertebral arteries demonstrate antegrade flow.  Subclavians: Right subclavian artery flow was disturbed. Normal flow               hemodynamics were seen in the left subclavian artery.  Echocardiogram 01/11/2020:  1. Left ventricular ejection fraction, by estimation, is 65 to 70%. The  left ventricle has normal function. The left ventricle has no regional  wall motion abnormalities. Left ventricular diastolic parameters were  normal.   2. Right ventricular systolic function is normal. The right ventricular  size is normal. There is normal pulmonary artery systolic pressure. The  estimated right ventricular systolic pressure is 22.4 mmHg.   3. The mitral valve is normal in structure. Trivial mitral valve  regurgitation. No evidence of mitral stenosis.   4. The aortic valve is normal in structure. Aortic valve regurgitation is  not visualized. No aortic stenosis is present.   5. The inferior vena cava is normal in size with greater than 50%  respiratory variability, suggesting right atrial pressure of 3  mmHg.   Coronary calcium score 01/11/2020: FINDINGS: Non-cardiac: See separate  report from [facility].   Aorta: Normal size. Ascending aorta 3.2 cm. Mild calcification of the aortic root and aortic arch. Moderate calcification in the descending aorta.   Pericardium: Normal   Coronary arteries: Normal anatomy. Right dominant. Calcification noted in all three coronary distributions and the distal left main.   IMPRESSION: Coronary calcium score of 711. This was 93rd percentile for age and sex matched control.   Calcification noted in all three coronary distributions and the distal left main.   Recommend aggressive risk factor modification including LDL goal <70.   EKG:  EKG is personally reviewed. 04/01/2022: EKG was not ordered today. 10/17/2021: EKG was not ordered. 09/06/2021: Atrial fibrillation. Rate 101 bpm. 08/13/2021: sinus rhythm 62 no other abnormalities QTc 401  Recent Labs: 06/12/2021: ALT 14 12/17/2021: BUN 10; Creatinine, Ser 0.78; Hemoglobin 11.9; Platelets 194; Potassium 3.8; Sodium 135   Recent Lipid Panel    Component Value Date/Time   CHOL 151 06/12/2021 0814   TRIG 63 06/12/2021 0814   HDL 81 06/12/2021 0814   CHOLHDL 1.9 06/12/2021 0814   LDLCALC 57 06/12/2021 0814            Physical Exam:    VS:  BP 110/60 (BP Location: Left Arm, Patient Position: Sitting, Cuff Size: Normal)   Pulse 64   Ht  (1.626 m)   Wt 183 lb (83 kg)   SpO2 96%   BMI 31.41 kg/m     Wt Readings from Last 3 Encounters:  04/01/22 183 lb (83 kg)  04/01/22 183 lb (83 kg)  01/28/22 187 lb 12.8 oz (85.2 kg)     GEN:  Well nourished, well developed in no acute distress HEENT: Normal NECK: No JVD; No carotid bruits LYMPHATICS: No lymphadenopathy CARDIAC: RRR, no murmurs, no rubs, gallops RESPIRATORY:  Clear to auscultation without rales, wheezing or rhonchi  ABDOMEN: Soft, non-tender, non-distended MUSCULOSKELETAL:  No edema; No deformity  SKIN: Warm and  dry NEUROLOGIC:  Alert and oriented x 3 PSYCHIATRIC:  Normal affect   ASSESSMENT:    1. Paroxysmal atrial fibrillation (HCC)   2. Coronary artery disease involving native coronary artery of native heart with angina pectoris (HCC)   3. Chronic anticoagulation   4. Hyperlipidemia LDL goal <70       PLAN:    In order of problems listed above:  Coronary artery disease - LAD stent placement March 2023.  It has been over 6 months.  Dr. Earmon Phoenix notes reviewed.  We will go ahead and discontinue Plavix.  No anginal symptoms.  Paroxysmal atrial fibrillation - Status post ablation in July 2023, Dr. Elberta Fortis.  Overall doing well. - Has been on amiodarone 100 mg over the last 2 months.  No further recurrence.  We will discontinue amiodarone. - Continue with metoprolol tartrate 25 twice a day for now.  Chronic anticoagulation - On Xarelto 20 mg.  Easy bruising noted.  Note we are stopping Plavix. - Given her horseback riding, discussed with her the possibility of Watchman device.  I think we can consider this in the future.  Primary hypertension - Very well controlled on multidrug regimen as above.  She checks at home usually 1 10-1 15 systolic.  Hyperlipidemia - On Crestor 20 mg.  LDL 57.  Excellent.  No myalgias    Follow-up:  6 mths  Medication Adjustments/Labs and Tests Ordered: Current medicines are reviewed at length with the patient today.  Concerns regarding medicines are outlined above.   No orders of the  defined types were placed in this encounter.  No orders of the defined types were placed in this encounter.  Patient Instructions  Medication Instructions:  Please discontinue Plavix and Amiodarone. Continue all other medications as listed.  *If you need a refill on your cardiac medications before your next appointment, please call your pharmacy*  Follow-Up: At [facility], you and your health needs are our priority.  As part of our continuing mission to  provide you with exceptional heart care, we have created designated Provider Care Teams.  These Care Teams include your primary Cardiologist (physician) and Advanced Practice Providers (APPs -  Physician Assistants and Nurse Practitioners) who all work together to provide you with the care you need, when you need it.  We recommend signing up for the patient portal called "MyChart".  Sign up information is provided on this After Visit Summary.  MyChart is used to connect with patients for Virtual Visits (Telemedicine).  Patients are able to view lab/test results, encounter notes, upcoming appointments, etc.  Non-urgent messages can be sent to your provider as well.   To learn more about what you can do with MyChart, go to [email or web address].    Your next appointment:   6 month(s)  The format for your next appointment:   In Person  Provider:   Donato Schultz, MD      Important Information About Sugar          I,Alexis Herring,acting as a scribe for Donato Schultz, MD.,have documented all relevant documentation on the behalf of Donato Schultz, MD,as directed by  Donato Schultz, MD while in the presence of Donato Schultz, MD.  I, Donato Schultz, MD, have reviewed all documentation for this visit. The documentation on 04/01/22 for the exam, diagnosis, procedures, and orders are all accurate and complete.   Signed, Donato Schultz, MD  04/01/2022 9:48 AM    [facility]

## 2022-04-01 NOTE — Patient Instructions (Signed)
Medication Instructions:  Your physician has recommended you make the following change in your medication:   ** Stop Amiodarone  ** Stop Metoprolol Tartrate  ** Begin Metoprolol Succinate 50mg  - 1 tablet by mouth daily  *If you need a refill on your cardiac medications before your next appointment, please call your pharmacy*   Lab Work: None ordered.  If you have labs (blood work) drawn today and your tests are completely normal, you will receive your results only by: [facility] (if you have MyChart) OR A paper copy in the mail If you have any lab test that is abnormal or we need to change your treatment, we will call you to review the results.   Testing/Procedures: None ordered.    Follow-Up: At [facility], you and your health needs are our priority.  As part of our continuing mission to provide you with exceptional heart care, we have created designated Provider Care Teams.  These Care Teams include your primary Cardiologist (physician) and Advanced Practice Providers (APPs -  Physician Assistants and Nurse Practitioners) who all work together to provide you with the care you need, when you need it.  We recommend signing up for the patient portal called "MyChart".  Sign up information is provided on this After Visit Summary.  MyChart is used to connect with patients for Virtual Visits (Telemedicine).  Patients are able to view lab/test results, encounter notes, upcoming appointments, etc.  Non-urgent messages can be sent to your provider as well.   To learn more about what you can do with MyChart, go to [email or web address].    Your next appointment:   6 months with Dr Curt Bears  Important Information About Sugar

## 2022-04-01 NOTE — Progress Notes (Signed)
Electrophysiology Office Note   Date:  04/01/2022   ID:  Colleen Cole, DOB 17-Jan-1946, MRN [id number]  PCP:  Caren Macadam, MD  Cardiologist:  Marlou Porch Primary Electrophysiologist:  Chee Dimon Meredith Leeds, MD    Chief Complaint: AF   History of Present Illness: Colleen Cole is a 76 y.o. female who is being seen today for the evaluation of AF at the request of Caren Macadam, MD. Presenting today for electrophysiology evaluation.  She has a history stated for atrial fibrillation, hypertension, coronary artery disease.  She is post LAD stent.  She was having intermittent episodes of atrial fibrillation after initially being treated with metoprolol.  She is now status post ablation 12/26/2021.  Today, denies symptoms of palpitations, chest pain, shortness of breath, orthopnea, PND, lower extremity edema, claudication, dizziness, presyncope, syncope, bleeding, or neurologic sequela. The patient is tolerating medications without difficulties.  Since her ablation she has done well.  She has had no further episodes of atrial fibrillation.  She does note that she cannot get her heart rate up when she exercises.  It is staying less than 100 bpm.  Aside from that, she has no acute complaints.   Past Medical History:  Diagnosis Date   Carotid disease, bilateral ([place])    Coronary artery disease    Hyperlipidemia    Hypertension    Paroxysmal A-fib ([place])    Past Surgical History:  Procedure Laterality Date   ATRIAL FIBRILLATION ABLATION N/A 12/26/2021   Procedure: ATRIAL FIBRILLATION ABLATION;  Surgeon: Constance Haw, MD;  Location: [place] [facility];  Service: Cardiovascular;  Laterality: N/A;   BREAST EXCISIONAL BIOPSY Right 1995   NEG   CORONARY STENT INTERVENTION N/A 08/27/2021   Procedure: CORONARY STENT INTERVENTION;  Surgeon: Sherren Mocha, MD;  Location: [place] [facility];  Service: Cardiovascular;  Laterality: N/A;   FRACTURE SURGERY  2007   fracture in left leg-at  patella, tibia and fibula   INTRAVASCULAR PRESSURE WIRE/FFR STUDY N/A 08/27/2021   Procedure: INTRAVASCULAR PRESSURE WIRE/FFR STUDY;  Surgeon: Sherren Mocha, MD;  Location: [place] [facility];  Service: Cardiovascular;  Laterality: N/A;   LEFT HEART CATH AND CORONARY ANGIOGRAPHY N/A 08/27/2021   Procedure: LEFT HEART CATH AND CORONARY ANGIOGRAPHY;  Surgeon: Sherren Mocha, MD;  Location: [place] [facility];  Service: Cardiovascular;  Laterality: N/A;   TOTAL HIP ARTHROPLASTY Right 08/04/2018   Procedure: TOTAL HIP ARTHROPLASTY ANTERIOR APPROACH;  Surgeon: Melrose Nakayama, MD;  Location: [facility];  Service: Orthopedics;  Laterality: Right;     Current Outpatient Medications  Medication Sig Dispense Refill   acetaminophen (TYLENOL) 500 MG tablet Take 1,000 mg by mouth every 6 (six) hours as needed for mild pain or headache.     alendronate (FOSAMAX) 70 MG tablet Take 70 mg by mouth every Sunday.     amLODipine (NORVASC) 5 MG tablet Take 10 mg by mouth daily.     Calcium Carb-Cholecalciferol (CALCIUM 600 + D PO) Take 1 tablet by mouth 2 (two) times daily.     cholecalciferol (VITAMIN D3) 25 MCG (1000 UNIT) tablet Take 1,000 Units by mouth daily.     Docusate Calcium (STOOL SOFTENER PO) Take 2-3 capsules by mouth every evening.     escitalopram (LEXAPRO) 5 MG tablet Take 5 mg by mouth daily.     fluticasone (FLONASE) 50 MCG/ACT nasal spray Place 1 spray into both nostrils daily as needed for allergies or rhinitis.     hydrALAZINE (APRESOLINE) 25 MG tablet Take  1 tablet (25 mg total) by mouth 2 (two) times daily. (Patient taking differently: Take 25 mg by mouth 2 (two) times daily. 2 tabs in the am, 1 at noon, 2 at pm) 180 tablet 3   hydrochlorothiazide (HYDRODIURIL) 25 MG tablet Take 25 mg by mouth daily.     levocetirizine (XYZAL) 5 MG tablet Take 5 mg by mouth every evening.     losartan (COZAAR) 100 MG tablet Take 100 mg by mouth every evening.     metoprolol succinate (TOPROL-XL) 50 MG 24 hr  tablet Take 1 tablet (50 mg total) by mouth daily. Take with or immediately following a meal. 90 tablet 3   nitroGLYCERIN (NITROSTAT) 0.4 MG SL tablet Place 1 tablet (0.4 mg total) under the tongue every 5 (five) minutes as needed. 25 tablet 2   rosuvastatin (CRESTOR) 20 MG tablet Take 1 tablet (20 mg total) by mouth daily. Pt needs to keep Oct appt for further refills 30 tablet 1   senna (SENOKOT) 8.6 MG TABS tablet Take 2-3 tablets by mouth every evening.     XARELTO 20 MG TABS tablet Take 1 tablet (20 mg total) by mouth daily. 90 tablet 3   No current facility-administered medications for this visit.    Allergies:   Patient has no known allergies.   Social History:  The patient  reports that she quit smoking about 23 years ago. Her smoking use included cigarettes. She has never used smokeless tobacco. She reports current alcohol use of about 2.0 standard drinks of alcohol per week. She reports that she does not use drugs.   Family History:  The patient's family history is not on file.    ROS:  Please see the history of present illness.   Otherwise, review of systems is positive for none.   All other systems are reviewed and negative.   PHYSICAL EXAM: VS:  BP 112/64   Pulse 60   Ht 5\' 4"  (1.626 m)   Wt 183 lb (83 kg)   SpO2 92%   BMI 31.41 kg/m  , BMI Body mass index is 31.41 kg/m. GEN: Well nourished, well developed, in no acute distress  HEENT: normal  Neck: no JVD, carotid bruits, or masses Cardiac: RRR; no murmurs, rubs, or gallops,no edema  Respiratory:  clear to auscultation bilaterally, normal work of breathing GI: soft, nontender, nondistended, + BS MS: no deformity or atrophy  Skin: warm and dry Neuro:  Strength and sensation are intact Psych: euthymic mood, full affect  EKG:  EKG is ordered today. Personal review of the ekg ordered shows sinus rhythm, rate 60  Recent Labs: 06/12/2021: ALT 14 12/17/2021: BUN 10; Creatinine, Ser 0.78; Hemoglobin 11.9; Platelets 194;  Potassium 3.8; Sodium 135    Lipid Panel     Component Value Date/Time   CHOL 151 06/12/2021 0814   TRIG 63 06/12/2021 0814   HDL 81 06/12/2021 0814   CHOLHDL 1.9 06/12/2021 0814   LDLCALC 57 06/12/2021 0814     Wt Readings from Last 3 Encounters:  04/01/22 183 lb (83 kg)  04/01/22 183 lb (83 kg)  01/28/22 187 lb 12.8 oz (85.2 kg)      Other studies Reviewed: Additional studies/ records that were reviewed today include: TTE 08/21/21  Review of the above records today demonstrates:   1. Left ventricular ejection fraction, by estimation, is 60 to 65%. The  left ventricle has normal function. The left ventricle has no regional  wall motion abnormalities. Left ventricular diastolic parameters  were  normal. The average left ventricular  global longitudinal strain is 27.3 %. The global longitudinal strain is  normal.   2. Right ventricular systolic function is normal. The right ventricular  size is normal. There is normal pulmonary artery systolic pressure. The  estimated right ventricular systolic pressure is 0000000 mmHg.   3. Left atrial size was moderately dilated.   4. The mitral valve is normal in structure. Trivial mitral valve  regurgitation. No evidence of mitral stenosis.   5. The aortic valve is tricuspid. Aortic valve regurgitation is not  visualized. No aortic stenosis is present.   6. The inferior vena cava is normal in size with greater than 50%  respiratory variability, suggesting right atrial pressure of 3 mmHg.   LHC 08/27/21 Severe stenosis of the LAD just after the first septal perforator, treated successfully with pressure wire guided PCI using a 2.5 x 20 mm Synergy DES Widely patent left main, left circumflex, and RCA with mild nonobstructive plaquing  ASSESSMENT AND PLAN:  1.  Paroxysmal atrial fibrillation: Currently on Xarelto 20 mg daily.  CHA2DS2-VASc of 4.  Status post ablation 12/26/2021.  She is currently feeling well and has noted no further episodes  of atrial fibrillation.  We Lillyian Heidt stop her amiodarone today.  We Deshae Dickison also stop her metoprolol and put her on Toprol-XL 50 mg.  She Etha Stambaugh potentially be a watchman candidate as she rides horses.  We Lovel Suazo discuss at the next visit.  2.  Coronary artery disease: Status post LAD stent.  Plan per primary cardiology.  3.  Hypertension: Currently well controlled  4.  Secondary hypercoagulable state: Continue Xarelto for atrial fibrillation as above   Current medicines are reviewed at length with the patient today.   The patient does not have concerns regarding her medicines.  The following changes were made today: Stop amiodarone stop metoprolol, start Toprol-XL  Labs/ tests ordered today include:  No orders of the defined types were placed in this encounter.    Disposition:   FU with Ladesha Pacini 6 months  Signed, Librado Guandique Meredith Leeds, MD  04/01/2022 10:04 AM     [facility] [address] [employer] [address] [phone] (office) [phone] (fax)

## 2022-04-02 NOTE — Addendum Note (Signed)
Addended by: Michelle Nasuti on: 04/02/2022 08:05 AM   Modules accepted: Orders

## 2022-04-15 ENCOUNTER — Other Ambulatory Visit: Payer: Self-pay | Admitting: Cardiology

## 2022-07-01 ENCOUNTER — Other Ambulatory Visit: Payer: Self-pay | Admitting: *Deleted

## 2022-07-01 DIAGNOSIS — I480 Paroxysmal atrial fibrillation: Secondary | ICD-10-CM

## 2022-07-01 NOTE — Progress Notes (Signed)
Jerline Pain, MD  Shellia Cleverly, RN Let's set up with structural team to discuss Watchman. Thanks Candee Furbish, MD

## 2022-07-07 NOTE — Progress Notes (Unsigned)
Watchman Consult Note Date:  07/08/2022   ID:  Colleen Cole, DOB 1946/05/30, MRN [id number]  PCP:  Aliene Beams, MD  Cardiologist:  Dr Anne Fu Primary Electrophysiologist: Dr Elberta Fortis Referring Physician: Dr Anne Fu   CC: to discuss Watchman implant    History of Present Illness: Colleen Cole is a 77 y.o. female referred by Dr Anne Fu for evaluation of atrial fibrillation and stroke prevention. She has paroxysmal atrial fibrillation.  The patient has been evaluated by their referring physician and is felt to be a poor candidate for long term OAC due to bleeding risk, horse riding with associated trauma risk.  She therefore presents today for Watchman evaluation.   She is here alone today. Overall doing well. She has done well since undergoing AF ablation in August 2023 with no clinical recurrence of AF. Today, she denies symptoms of palpitations, chest pain, shortness of breath, orthopnea, PND, lower extremity edema, claudication, dizziness, presyncope, syncope, bleeding, or neurologic sequela. She does complain of poor 'stamina.' States that she feels tired much of the time. She is very concerned about bleeding risk as she ages. She complains of easy bruising but denies clinical hx of major bleeding or blood transfusion. She is active with horse riding and other outdoor activities.    Past Medical History:  Diagnosis Date   Carotid disease, bilateral ([facility])    Coronary artery disease    Hyperlipidemia    Hypertension    Paroxysmal A-fib ([facility])    Past Surgical History:  Procedure Laterality Date   ATRIAL FIBRILLATION ABLATION N/A 12/26/2021   Procedure: ATRIAL FIBRILLATION ABLATION;  Surgeon: Regan Lemming, MD;  Location: [facility];  Service: Cardiovascular;  Laterality: N/A;   BREAST EXCISIONAL BIOPSY Right 1995   NEG   CORONARY STENT INTERVENTION N/A 08/27/2021   Procedure: CORONARY STENT INTERVENTION;  Surgeon: Tonny Bollman, MD;  Location: [facility];  Service: Cardiovascular;  Laterality: N/A;   FRACTURE SURGERY  2007   fracture in left leg-at patella, tibia and fibula   INTRAVASCULAR PRESSURE WIRE/FFR STUDY N/A 08/27/2021   Procedure: INTRAVASCULAR PRESSURE WIRE/FFR STUDY;  Surgeon: Tonny Bollman, MD;  [facility]: [facility];  Service: Cardiovascular;  Laterality: N/A;   LEFT HEART CATH AND CORONARY ANGIOGRAPHY N/A 08/27/2021   Procedure: LEFT HEART CATH AND CORONARY ANGIOGRAPHY;  Surgeon: Tonny Bollman, MD;  Location: [facility];  Service: Cardiovascular;  Laterality: N/A;   TOTAL HIP ARTHROPLASTY Right 08/04/2018   Procedure: TOTAL HIP ARTHROPLASTY ANTERIOR APPROACH;  Surgeon: Marcene Corning, MD;  Location: [facility];  Service: Orthopedics;  Laterality: Right;     Current Outpatient Medications  Medication Sig Dispense Refill   acetaminophen (TYLENOL) 500 MG tablet Take 1,000 mg by mouth every 6 (six) hours as needed for mild pain or headache.     alendronate (FOSAMAX) 70 MG tablet Take 70 mg by mouth every Sunday.     amLODipine (NORVASC) 5 MG tablet Take 10 mg by mouth daily.     Calcium Carb-Cholecalciferol (CALCIUM 600 + D PO) Take 1 tablet by mouth 2 (two) times daily.     cholecalciferol (VITAMIN D3) 25 MCG (1000 UNIT) tablet Take 1,000 Units by mouth daily.     Docusate Calcium (STOOL SOFTENER PO) Take 2-3 capsules by mouth every evening.     escitalopram (LEXAPRO) 5 MG tablet Take 5 mg by mouth daily.     fluticasone (FLONASE) 50 MCG/ACT nasal spray Place 1 spray into both nostrils daily  as needed for allergies or rhinitis.     hydrALAZINE (APRESOLINE) 25 MG tablet Take 1 tablet (25 mg total) by mouth 2 (two) times daily. (Patient taking differently: Take 25 mg by mouth 2 (two) times daily. 2 tabs in the am, 1 at noon, 2 tabs at pm) 180 tablet 3   hydrochlorothiazide (HYDRODIURIL) 25 MG tablet Take 25 mg by mouth daily.     levocetirizine (XYZAL) 5 MG tablet Take 5 mg by mouth every evening.     losartan  (COZAAR) 100 MG tablet Take 100 mg by mouth every evening.     metoprolol succinate (TOPROL-XL) 50 MG 24 hr tablet Take 1 tablet (50 mg total) by mouth daily. Take with or immediately following a meal. 90 tablet 3   nitroGLYCERIN (NITROSTAT) 0.4 MG SL tablet Place 1 tablet (0.4 mg total) under the tongue every 5 (five) minutes as needed. 25 tablet 2   rosuvastatin (CRESTOR) 20 MG tablet TAKE 1 TABLET(20 MG) BY MOUTH DAILY 30 tablet 5   senna (SENOKOT) 8.6 MG TABS tablet Take 2-3 tablets by mouth every evening.     TOPAMAX 25 MG tablet daily.     XARELTO 20 MG TABS tablet Take 1 tablet (20 mg total) by mouth daily. 90 tablet 3   No current facility-administered medications for this visit.    Allergies:   Patient has no known allergies.   Social History:  The patient  reports that she quit smoking about 24 years ago. Her smoking use included cigarettes. She has never used smokeless tobacco. She reports current alcohol use of about 2.0 standard drinks of alcohol per week. She reports that she does not use drugs.   Family History:  The patient's  family history is not on file.    ROS:  Please see the history of present illness.   All other systems are reviewed and negative.    PHYSICAL EXAM: VS:  BP (!) 110/56   Pulse 69   Ht 5\' 2"  (1.575 m)   Wt 184 lb 6.4 oz (83.6 kg)   SpO2 95%   BMI 33.73 kg/m  , BMI Body mass index is 33.73 kg/m. GEN: Well nourished, well developed, in no acute distress  HEENT: normal  Neck: no JVD, carotid bruits, or masses Cardiac: RRR; no murmurs, rubs, or gallops,no edema  Respiratory:  clear to auscultation bilaterally, normal work of breathing GI: soft, nontender, nondistended, + BS MS: no deformity or atrophy  Skin: warm and dry  Neuro:  Strength and sensation are intact Psych: euthymic mood, full affect  EKG:  EKG is ordered today. The ekg ordered today shows NSR 69 bpm, within normal limits   Recent Labs: 12/17/2021: BUN 10; Creatinine, Ser  0.78; Hemoglobin 11.9; Platelets 194; Potassium 3.8; Sodium 135    Lipid Panel     Component Value Date/Time   CHOL 151 06/12/2021 0814   TRIG 63 06/12/2021 0814   HDL 81 06/12/2021 0814   CHOLHDL 1.9 06/12/2021 0814   LDLCALC 57 06/12/2021 0814     Wt Readings from Last 3 Encounters:  07/08/22 184 lb 6.4 oz (83.6 kg)  04/01/22 183 lb (83 kg)  04/01/22 183 lb (83 kg)      Other studies Reviewed: Additional studies/ records that were reviewed today include:  Echo 08/21/2021:  1. Left ventricular ejection fraction, by estimation, is 60 to 65%. The  left ventricle has normal function. The left ventricle has no regional  wall motion abnormalities. Left ventricular diastolic  parameters were  normal. The average left ventricular  global longitudinal strain is 27.3 %. The global longitudinal strain is  normal.   2. Right ventricular systolic function is normal. The right ventricular  size is normal. There is normal pulmonary artery systolic pressure. The  estimated right ventricular systolic pressure is 16.9 mmHg.   3. Left atrial size was moderately dilated.   4. The mitral valve is normal in structure. Trivial mitral valve  regurgitation. No evidence of mitral stenosis.   5. The aortic valve is tricuspid. Aortic valve regurgitation is not  visualized. No aortic stenosis is present.   6. The inferior vena cava is normal in size with greater than 50%  respiratory variability, suggesting right atrial pressure of 3 mmHg.   Cardiac Cath 08/27/2021: Severe stenosis of the LAD just after the first septal perforator, treated successfully with pressure wire guided PCI using a 2.5 x 20 mm Synergy DES Widely patent left main, left circumflex, and RCA with mild nonobstructive plaquing   Recommendations: Continue clopidogrel x6 months, resume rivaroxaban tomorrow.  Would avoid aspirin in the setting of chronic oral anticoagulation plus clopidogrel in order to avoid excessive bleeding  risk.  ASSESSMENT AND PLAN:  1.  Paroxysmal atrial fibrillation I have seen Colleen Cole is a 77 y.o. female in the office today who has been referred for a Watchman left atrial appendage closure device.  She has a history of paroxysmal atrial fibrillation.  The patients atrial fibrillation-related risk scores are outlined below:   CHA2DS2-VASc Score = 4   This indicates a 4.8% annual risk of stroke. The patient's score is based upon: CHF History: 0 HTN History: 1 Diabetes History: 0 Stroke History: 0 Vascular Disease History: 1 Age Score: 1 Gender Score: 1      HAS-BLED score = 1 Hypertension No  Abnormal renal and liver function (Dialysis, transplant, Cr >2.26 mg/dL /Cirrhosis or Bilirubin >2x Normal or AST/ALT/AP >3x Normal) No  Stroke No  Bleeding No  Labile INR (Unstable/high INR) No  Elderly (>65) Yes  Drugs or alcohol (? 8 drinks/week, anti-plt or NSAID) No   The patient is not felt to be a long term anticoagulation candidate secondary to lifestyle, concerns over bleeding risk.  As an alternative to lon-term oral anticoagulation, a device strategy with left atrial appendage occlusion is an appropriate consideration. The patients chart has been reviewed and I along with their referring cardiologist feel that they would be a candidate for short term oral anticoagulation.  Procedural risks for the Watchman implant have been reviewed with the patient, including but not limited to a <1% risk of stroke, <1% risk of perforation or pericardial effusion, 0.1% risk of device embolization.  Given the patient's poor candidacy for long-term oral anticoagulation, ability to tolerate short term oral anticoagulation, I have recommended the Watchman FLX left atrial appendage closure device through a shared decision making conversation with the patient.   The patient had a cardiac CTA in July 2023 for AF ablation planning. I personally reviewed those images and she appears to have a chicken  wing appendage anatomy. I am going to ask our imaging specialists to review her anatomy to make sure we have enough depth in the appendage to access for a successful implant. If so, the patient would like to proceed with Watchman FLX implantation. I will contact her after reviewing her CTA with imaging colleagues. As part of her evaluation today, I demonstrated a procedural animation of Watchman implantation, discussed the details of  the procedure, expected recovery, and anticoagulation strategies post-implantation. She understands that she would have an 8 week post-procedure CTA for surveillance. She understands that she would take xarelto x 6 weeks post-implantation then follow with ASA and plavix through 6 months.  Current medicines are reviewed at length with the patient today.   The patient does not have concerns regarding her medicines.  The following changes were made today:  none  Labs/ tests ordered today include:  Orders Placed This Encounter  Procedures   EKG 12-Lead     Signed, Sherren Mocha, MD 07/08/2022  11:13 AM     [facility] [address] [place] [address] [phone] (office) [phone] (fax)

## 2022-07-07 NOTE — H&P (View-Only) (Signed)
Watchman Consult Note Date:  07/08/2022   ID:  Colleen Cole, DOB 1945-07-17, MRN [id number]  PCP:  Aliene Beams, MD  Cardiologist:  Dr Anne Fu Primary Electrophysiologist: Dr Elberta Fortis Referring Physician: Dr Anne Fu   CC: to discuss Watchman implant    History of Present Illness: Colleen Cole is a 77 y.o. female referred by Dr Anne Fu for evaluation of atrial fibrillation and stroke prevention. She has paroxysmal atrial fibrillation.  The patient has been evaluated by their referring physician and is felt to be a poor candidate for long term OAC due to bleeding risk, horse riding with associated trauma risk.  She therefore presents today for Watchman evaluation.   She is here alone today. Overall doing well. She has done well since undergoing AF ablation in August 2023 with no clinical recurrence of AF. Today, she denies symptoms of palpitations, chest pain, shortness of breath, orthopnea, PND, lower extremity edema, claudication, dizziness, presyncope, syncope, bleeding, or neurologic sequela. She does complain of poor 'stamina.' States that she feels tired much of the time. She is very concerned about bleeding risk as she ages. She complains of easy bruising but denies clinical hx of major bleeding or blood transfusion. She is active with horse riding and other outdoor activities.    Past Medical History:  Diagnosis Date   Carotid disease, bilateral (HCC)    Coronary artery disease    Hyperlipidemia    Hypertension    Paroxysmal A-fib (HCC)    Past Surgical History:  Procedure Laterality Date   ATRIAL FIBRILLATION ABLATION N/A 12/26/2021   Procedure: ATRIAL FIBRILLATION ABLATION;  Surgeon: Regan Lemming, MD;  Location: [facility] INVASIVE CV LAB;  Service: Cardiovascular;  Laterality: N/A;   BREAST EXCISIONAL BIOPSY Right 1995   NEG   CORONARY STENT INTERVENTION N/A 08/27/2021   Procedure: CORONARY STENT INTERVENTION;  Surgeon: Tonny Bollman, MD;  Location: [facility] INVASIVE CV  LAB;  Service: Cardiovascular;  Laterality: N/A;   FRACTURE SURGERY  2007   fracture in left leg-at patella, tibia and fibula   INTRAVASCULAR PRESSURE WIRE/FFR STUDY N/A 08/27/2021   Procedure: INTRAVASCULAR PRESSURE WIRE/FFR STUDY;  Surgeon: Tonny Bollman, MD;  Location: [facility] INVASIVE CV LAB;  Service: Cardiovascular;  Laterality: N/A;   LEFT HEART CATH AND CORONARY ANGIOGRAPHY N/A 08/27/2021   Procedure: LEFT HEART CATH AND CORONARY ANGIOGRAPHY;  Surgeon: Tonny Bollman, MD;  Location: [facility] INVASIVE CV LAB;  Service: Cardiovascular;  Laterality: N/A;   TOTAL HIP ARTHROPLASTY Right 08/04/2018   Procedure: TOTAL HIP ARTHROPLASTY ANTERIOR APPROACH;  Surgeon: Marcene Corning, MD;  Location: [facility] ORS;  Service: Orthopedics;  Laterality: Right;     Current Outpatient Medications  Medication Sig Dispense Refill   acetaminophen (TYLENOL) 500 MG tablet Take 1,000 mg by mouth every 6 (six) hours as needed for mild pain or headache.     alendronate (FOSAMAX) 70 MG tablet Take 70 mg by mouth every Sunday.     amLODipine (NORVASC) 5 MG tablet Take 10 mg by mouth daily.     Calcium Carb-Cholecalciferol (CALCIUM 600 + D PO) Take 1 tablet by mouth 2 (two) times daily.     cholecalciferol (VITAMIN D3) 25 MCG (1000 UNIT) tablet Take 1,000 Units by mouth daily.     Docusate Calcium (STOOL SOFTENER PO) Take 2-3 capsules by mouth every evening.     escitalopram (LEXAPRO) 5 MG tablet Take 5 mg by mouth daily.     fluticasone (FLONASE) 50 MCG/ACT nasal spray Place 1 spray into both nostrils daily  as needed for allergies or rhinitis.     hydrALAZINE (APRESOLINE) 25 MG tablet Take 1 tablet (25 mg total) by mouth 2 (two) times daily. (Patient taking differently: Take 25 mg by mouth 2 (two) times daily. 2 tabs in the am, 1 at noon, 2 tabs at pm) 180 tablet 3   hydrochlorothiazide (HYDRODIURIL) 25 MG tablet Take 25 mg by mouth daily.     levocetirizine (XYZAL) 5 MG tablet Take 5 mg by mouth every evening.     losartan  (COZAAR) 100 MG tablet Take 100 mg by mouth every evening.     metoprolol succinate (TOPROL-XL) 50 MG 24 hr tablet Take 1 tablet (50 mg total) by mouth daily. Take with or immediately following a meal. 90 tablet 3   nitroGLYCERIN (NITROSTAT) 0.4 MG SL tablet Place 1 tablet (0.4 mg total) under the tongue every 5 (five) minutes as needed. 25 tablet 2   rosuvastatin (CRESTOR) 20 MG tablet TAKE 1 TABLET(20 MG) BY MOUTH DAILY 30 tablet 5   senna (SENOKOT) 8.6 MG TABS tablet Take 2-3 tablets by mouth every evening.     TOPAMAX 25 MG tablet daily.     XARELTO 20 MG TABS tablet Take 1 tablet (20 mg total) by mouth daily. 90 tablet 3   No current facility-administered medications for this visit.    Allergies:   Patient has no known allergies.   Social History:  The patient  reports that she quit smoking about 24 years ago. Her smoking use included cigarettes. She has never used smokeless tobacco. She reports current alcohol use of about 2.0 standard drinks of alcohol per week. She reports that she does not use drugs.   Family History:  The patient's  family history is not on file.    ROS:  Please see the history of present illness.   All other systems are reviewed and negative.    PHYSICAL EXAM: VS:  BP (!) 110/56   Pulse 69   Ht 5\' 2"  (1.575 m)   Wt 184 lb 6.4 oz (83.6 kg)   SpO2 95%   BMI 33.73 kg/m  , BMI Body mass index is 33.73 kg/m. GEN: Well nourished, well developed, in no acute distress  HEENT: normal  Neck: no JVD, carotid bruits, or masses Cardiac: RRR; no murmurs, rubs, or gallops,no edema  Respiratory:  clear to auscultation bilaterally, normal work of breathing GI: soft, nontender, nondistended, + BS MS: no deformity or atrophy  Skin: warm and dry  Neuro:  Strength and sensation are intact Psych: euthymic mood, full affect  EKG:  EKG is ordered today. The ekg ordered today shows NSR 69 bpm, within normal limits   Recent Labs: 12/17/2021: BUN 10; Creatinine, Ser  0.78; Hemoglobin 11.9; Platelets 194; Potassium 3.8; Sodium 135    Lipid Panel     Component Value Date/Time   CHOL 151 06/12/2021 0814   TRIG 63 06/12/2021 0814   HDL 81 06/12/2021 0814   CHOLHDL 1.9 06/12/2021 0814   LDLCALC 57 06/12/2021 0814     Wt Readings from Last 3 Encounters:  07/08/22 184 lb 6.4 oz (83.6 kg)  04/01/22 183 lb (83 kg)  04/01/22 183 lb (83 kg)      Other studies Reviewed: Additional studies/ records that were reviewed today include:  Echo 08/21/2021:  1. Left ventricular ejection fraction, by estimation, is 60 to 65%. The  left ventricle has normal function. The left ventricle has no regional  wall motion abnormalities. Left ventricular diastolic  parameters were  normal. The average left ventricular  global longitudinal strain is 27.3 %. The global longitudinal strain is  normal.   2. Right ventricular systolic function is normal. The right ventricular  size is normal. There is normal pulmonary artery systolic pressure. The  estimated right ventricular systolic pressure is 56.4 mmHg.   3. Left atrial size was moderately dilated.   4. The mitral valve is normal in structure. Trivial mitral valve  regurgitation. No evidence of mitral stenosis.   5. The aortic valve is tricuspid. Aortic valve regurgitation is not  visualized. No aortic stenosis is present.   6. The inferior vena cava is normal in size with greater than 50%  respiratory variability, suggesting right atrial pressure of 3 mmHg.   Cardiac Cath 08/27/2021: Severe stenosis of the LAD just after the first septal perforator, treated successfully with pressure wire guided PCI using a 2.5 x 20 mm Synergy DES Widely patent left main, left circumflex, and RCA with mild nonobstructive plaquing   Recommendations: Continue clopidogrel x6 months, resume rivaroxaban tomorrow.  Would avoid aspirin in the setting of chronic oral anticoagulation plus clopidogrel in order to avoid excessive bleeding  risk.  ASSESSMENT AND PLAN:  1.  Paroxysmal atrial fibrillation I have seen Jemiah Nohemy Koop is a 77 y.o. female in the office today who has been referred for a Watchman left atrial appendage closure device.  She has a history of paroxysmal atrial fibrillation.  The patients atrial fibrillation-related risk scores are outlined below:   CHA2DS2-VASc Score = 4   This indicates a 4.8% annual risk of stroke. The patient's score is based upon: CHF History: 0 HTN History: 1 Diabetes History: 0 Stroke History: 0 Vascular Disease History: 1 Age Score: 1 Gender Score: 1      HAS-BLED score = 1 Hypertension No  Abnormal renal and liver function (Dialysis, transplant, Cr >2.26 mg/dL /Cirrhosis or Bilirubin >2x Normal or AST/ALT/AP >3x Normal) No  Stroke No  Bleeding No  Labile INR (Unstable/high INR) No  Elderly (>65) Yes  Drugs or alcohol (? 8 drinks/week, anti-plt or NSAID) No   The patient is not felt to be a long term anticoagulation candidate secondary to lifestyle, concerns over bleeding risk.  As an alternative to lon-term oral anticoagulation, a device strategy with left atrial appendage occlusion is an appropriate consideration. The patients chart has been reviewed and I along with their referring cardiologist feel that they would be a candidate for short term oral anticoagulation.  Procedural risks for the Watchman implant have been reviewed with the patient, including but not limited to a <1% risk of stroke, <1% risk of perforation or pericardial effusion, 0.1% risk of device embolization.  Given the patient's poor candidacy for long-term oral anticoagulation, ability to tolerate short term oral anticoagulation, I have recommended the Watchman FLX left atrial appendage closure device through a shared decision making conversation with the patient.   The patient had a cardiac CTA in July 2023 for AF ablation planning. I personally reviewed those images and she appears to have a chicken  wing appendage anatomy. I am going to ask our imaging specialists to review her anatomy to make sure we have enough depth in the appendage to access for a successful implant. If so, the patient would like to proceed with Watchman FLX implantation. I will contact her after reviewing her CTA with imaging colleagues. As part of her evaluation today, I demonstrated a procedural animation of Watchman implantation, discussed the details of  the procedure, expected recovery, and anticoagulation strategies post-implantation. She understands that she would have an 8 week post-procedure CTA for surveillance. She understands that she would take xarelto x 6 weeks post-implantation then follow with ASA and plavix through 6 months.  Current medicines are reviewed at length with the patient today.   The patient does not have concerns regarding her medicines.  The following changes were made today:  none  Labs/ tests ordered today include:  Orders Placed This Encounter  Procedures   EKG 12-Lead     Signed, Sherren Mocha, MD 07/08/2022  11:13 AM     [facility] [address] [place] [address] [phone] (office) [phone] (fax)

## 2022-07-08 ENCOUNTER — Telehealth: Payer: Self-pay

## 2022-07-08 ENCOUNTER — Other Ambulatory Visit: Payer: Self-pay

## 2022-07-08 ENCOUNTER — Encounter: Payer: Self-pay | Admitting: Cardiovascular Disease

## 2022-07-08 ENCOUNTER — Ambulatory Visit (INDEPENDENT_AMBULATORY_CARE_PROVIDER_SITE_OTHER): Payer: Medicare Other | Admitting: Cardiovascular Disease

## 2022-07-08 VITALS — BP 110/56 | HR 69 | Ht 62.0 in | Wt 184.4 lb

## 2022-07-08 DIAGNOSIS — I480 Paroxysmal atrial fibrillation: Secondary | ICD-10-CM

## 2022-07-08 NOTE — Patient Instructions (Signed)
Medication Instructions:   Your physician recommends that you continue on your current medications as directed. Please refer to the Current Medication list given to you today.   *If you need a refill on your cardiac medications before your next appointment, please call your pharmacy*   Lab Work:  None ordered.   If you have labs (blood work) drawn today and your tests are completely normal, you will receive your results only by: [place] (if you have MyChart) OR A paper copy in the mail If you have any lab test that is abnormal or we need to change your treatment, we will call you to review the results.   Testing/Procedures:   None ordered.   Follow-Up: At [facility], you and your health needs are our priority.  As part of our continuing mission to provide you with exceptional heart care, we have created designated Provider Care Teams.  These Care Teams include your primary Cardiologist (physician) and Advanced Practice Providers (APPs -  Physician Assistants and Nurse Practitioners) who all work together to provide you with the care you need, when you need it.  We recommend signing up for the patient portal called "MyChart".  Sign up information is provided on this After Visit Summary.  MyChart is used to connect with patients for Virtual Visits (Telemedicine).  Patients are able to view lab/test results, encounter notes, upcoming appointments, etc.  Non-urgent messages can be sent to your provider as well.   To learn more about what you can do with MyChart, go to [email or web address].    Your next appointment:    The Structural Heart Team will be in touch.

## 2022-07-08 NOTE — Telephone Encounter (Signed)
See other phone note from 07/08/22. Per Dr. Burt Knack, [place] to proceed with scheduling Watchman implant.  Spoke with the patient who agreed to implant 07/11/2022. She will come back to the office tomorrow for blood work and to pick up her instruction letter and soap.  Confirmed with the precert department that no auth is needed to proceed.  The patient was grateful for call and agreed with plan.

## 2022-07-08 NOTE — Telephone Encounter (Signed)
Per Dr. Kyla Balzarine measurements: "Colleen Cancel Wallis Bamberg, MD  Sherren Mocha, MD; Werner Lean, MD; Geralynn Rile, MD Cc: Theodoro Parma, RN; Barkley Boards, RN The wing bend is pretty distal Measures with peak diameter 18.9 depth 19.1 average diameter 17.7 would size to a 24 mm FLX device"

## 2022-07-08 NOTE — Telephone Encounter (Signed)
Favorable anatomy for Watchman implant. Ok to schedule. See today's office evaluation. thanks

## 2022-07-09 ENCOUNTER — Telehealth: Payer: Self-pay

## 2022-07-09 ENCOUNTER — Ambulatory Visit: Payer: Medicare Other | Attending: Cardiovascular Disease

## 2022-07-09 DIAGNOSIS — I480 Paroxysmal atrial fibrillation: Secondary | ICD-10-CM

## 2022-07-09 LAB — CBC WITH DIFFERENTIAL/PLATELET
Basophils Absolute: 0 10*3/uL (ref 0.0–0.2)
Basos: 1 %
EOS (ABSOLUTE): 0.1 10*3/uL (ref 0.0–0.4)
Eos: 3 %
Hematocrit: 35.3 % (ref 34.0–46.6)
Hemoglobin: 11.8 g/dL (ref 11.1–15.9)
Lymphocytes Absolute: 1.3 10*3/uL (ref 0.7–3.1)
Lymphs: 30 %
MCH: 29.9 pg (ref 26.6–33.0)
MCHC: 33.4 g/dL (ref 31.5–35.7)
MCV: 89 fL (ref 79–97)
Monocytes Absolute: 0.4 10*3/uL (ref 0.1–0.9)
Monocytes: 10 %
Neutrophils Absolute: 2.5 10*3/uL (ref 1.4–7.0)
Neutrophils: 56 %
Platelets: 235 10*3/uL (ref 150–450)
RBC: 3.95 x10E6/uL (ref 3.77–5.28)
RDW: 14.3 % (ref 11.7–15.4)
WBC: 4.5 10*3/uL (ref 3.4–10.8)

## 2022-07-09 LAB — BASIC METABOLIC PANEL
BUN/Creatinine Ratio: 17 (ref 12–28)
BUN: 14 mg/dL (ref 8–27)
CO2: 24 mmol/L (ref 20–29)
Calcium: 9.6 mg/dL (ref 8.7–10.3)
Chloride: 101 mmol/L (ref 96–106)
Creatinine, Ser: 0.83 mg/dL (ref 0.57–1.00)
Glucose: 103 mg/dL — ABNORMAL HIGH (ref 70–99)
Potassium: 3.7 mmol/L (ref 3.5–5.2)
Sodium: 136 mmol/L (ref 134–144)
eGFR: 74 mL/min/{1.73_m2} (ref >59)

## 2022-07-09 NOTE — Telephone Encounter (Signed)
Spoke with patient and reviewed all instructions on letter picked up today. She understands to HOLD XARELTO the day prior to the procedure and ONLY TAKE amlodipine and metoprolol the AM of the procedure.  Confirmed arrival time of 0700 for 0930 procedure. She was grateful for call and agreed with plan.

## 2022-07-10 ENCOUNTER — Encounter (HOSPITAL_COMMUNITY): Payer: Self-pay | Admitting: Cardiovascular Disease

## 2022-07-11 ENCOUNTER — Inpatient Hospital Stay (HOSPITAL_COMMUNITY): Payer: Medicare Other

## 2022-07-11 ENCOUNTER — Encounter (HOSPITAL_COMMUNITY)
Admission: RE | Disposition: A | Discharge status: Home / Self Care | Payer: Self-pay | Source: Ambulatory Visit | Attending: Cardiovascular Disease

## 2022-07-11 ENCOUNTER — Inpatient Hospital Stay (HOSPITAL_COMMUNITY): Payer: Medicare Other | Admitting: Anesthesiology

## 2022-07-11 ENCOUNTER — Other Ambulatory Visit: Payer: Self-pay

## 2022-07-11 ENCOUNTER — Encounter (HOSPITAL_COMMUNITY): Payer: Self-pay | Admitting: Cardiovascular Disease

## 2022-07-11 ENCOUNTER — Inpatient Hospital Stay (HOSPITAL_COMMUNITY)
Admission: RE | Admit: 2022-07-11 | Discharge: 2022-07-11 | DRG: 274 | Disposition: A | Discharge status: Home / Self Care | Payer: Medicare Other | Source: Ambulatory Visit | Attending: Cardiovascular Disease | Admitting: Cardiovascular Disease

## 2022-07-11 DIAGNOSIS — Z7901 Long term (current) use of anticoagulants: Secondary | ICD-10-CM | POA: Diagnosis not present

## 2022-07-11 DIAGNOSIS — Z96641 Presence of right artificial hip joint: Secondary | ICD-10-CM | POA: Diagnosis present

## 2022-07-11 DIAGNOSIS — Z9181 History of falling: Secondary | ICD-10-CM | POA: Diagnosis not present

## 2022-07-11 DIAGNOSIS — Z955 Presence of coronary angioplasty implant and graft: Secondary | ICD-10-CM

## 2022-07-11 DIAGNOSIS — I4891 Unspecified atrial fibrillation: Secondary | ICD-10-CM

## 2022-07-11 DIAGNOSIS — I480 Paroxysmal atrial fibrillation: Secondary | ICD-10-CM

## 2022-07-11 DIAGNOSIS — Z95818 Presence of other cardiac implants and grafts: Secondary | ICD-10-CM

## 2022-07-11 DIAGNOSIS — E785 Hyperlipidemia, unspecified: Secondary | ICD-10-CM | POA: Diagnosis present

## 2022-07-11 DIAGNOSIS — I25119 Atherosclerotic heart disease of native coronary artery with unspecified angina pectoris: Secondary | ICD-10-CM

## 2022-07-11 DIAGNOSIS — Z006 Encounter for examination for normal comparison and control in clinical research program: Secondary | ICD-10-CM | POA: Diagnosis not present

## 2022-07-11 DIAGNOSIS — Z87891 Personal history of nicotine dependence: Secondary | ICD-10-CM

## 2022-07-11 DIAGNOSIS — Z7983 Long term (current) use of bisphosphonates: Secondary | ICD-10-CM

## 2022-07-11 DIAGNOSIS — I10 Essential (primary) hypertension: Secondary | ICD-10-CM | POA: Diagnosis present

## 2022-07-11 DIAGNOSIS — G4733 Obstructive sleep apnea (adult) (pediatric): Secondary | ICD-10-CM | POA: Diagnosis present

## 2022-07-11 DIAGNOSIS — I2510 Atherosclerotic heart disease of native coronary artery without angina pectoris: Secondary | ICD-10-CM | POA: Diagnosis present

## 2022-07-11 DIAGNOSIS — Z79899 Other long term (current) drug therapy: Secondary | ICD-10-CM

## 2022-07-11 HISTORY — DX: Presence of other cardiac implants and grafts: Z95.818

## 2022-07-11 HISTORY — PX: TEE WITHOUT CARDIOVERSION: SHX5443

## 2022-07-11 HISTORY — PX: LEFT ATRIAL APPENDAGE OCCLUSION: EP1229

## 2022-07-11 LAB — ECHO TEE

## 2022-07-11 LAB — POCT ACTIVATED CLOTTING TIME
Activated Clotting Time: 255 seconds
Activated Clotting Time: 314 seconds

## 2022-07-11 LAB — SURGICAL PCR SCREEN
MRSA, PCR: NEGATIVE
Staphylococcus aureus: NEGATIVE

## 2022-07-11 LAB — TYPE AND SCREEN
ABO/RH(D): O NEG
Antibody Screen: NEGATIVE

## 2022-07-11 SURGERY — LEFT ATRIAL APPENDAGE OCCLUSION
Anesthesia: General

## 2022-07-11 MED ORDER — PHENYLEPHRINE HCL-NACL 20-0.9 MG/250ML-% IV SOLN
INTRAVENOUS | Status: DC | PRN
  Administered 2022-07-11: 20 ug/min via INTRAVENOUS

## 2022-07-11 MED ORDER — DEXAMETHASONE SODIUM PHOSPHATE 10 MG/ML IJ SOLN
INTRAMUSCULAR | Status: DC | PRN
  Administered 2022-07-11: 5 mg via INTRAVENOUS

## 2022-07-11 MED ORDER — ONDANSETRON HCL 4 MG/2ML IJ SOLN
INTRAMUSCULAR | Status: DC | PRN
  Administered 2022-07-11: 4 mg via INTRAVENOUS

## 2022-07-11 MED ORDER — SODIUM CHLORIDE 0.9% FLUSH: 3.0000 mL | INTRAVENOUS | Status: DC | PRN

## 2022-07-11 MED ORDER — HEPARIN (PORCINE) IN NACL 1000-0.9 UT/500ML-% IV SOLN
INTRAVENOUS | Status: DC | PRN
  Administered 2022-07-11: 500 mL

## 2022-07-11 MED ORDER — CHLORHEXIDINE GLUCONATE 0.12 % MT SOLN
15.0000 mL | OROMUCOSAL | Status: AC
  Administered 2022-07-11: 15 mL via OROMUCOSAL
  Filled 2022-07-11: qty 15

## 2022-07-11 MED ORDER — PROTAMINE SULFATE 10 MG/ML IV SOLN
INTRAVENOUS | Status: DC | PRN
  Administered 2022-07-11: 30 mg via INTRAVENOUS

## 2022-07-11 MED ORDER — CHLORHEXIDINE GLUCONATE 4 % EX LIQD: Freq: Once | CUTANEOUS | Status: DC

## 2022-07-11 MED ORDER — SUGAMMADEX SODIUM 200 MG/2ML IV SOLN
INTRAVENOUS | Status: DC | PRN
  Administered 2022-07-11: 100 mg via INTRAVENOUS
  Administered 2022-07-11: 200 mg via INTRAVENOUS

## 2022-07-11 MED ORDER — CEFAZOLIN SODIUM-DEXTROSE 2-4 GM/100ML-% IV SOLN
2.0000 g | INTRAVENOUS | Status: AC
  Administered 2022-07-11: 2 g via INTRAVENOUS
  Filled 2022-07-11: qty 100

## 2022-07-11 MED ORDER — HEPARIN (PORCINE) IN NACL 2000-0.9 UNIT/L-% IV SOLN
INTRAVENOUS | Status: AC
  Filled 2022-07-11: qty 1000

## 2022-07-11 MED ORDER — ACETAMINOPHEN 500 MG PO TABS
1000.0000 mg | ORAL_TABLET | Freq: Once | ORAL | Status: AC
  Administered 2022-07-11: 1000 mg via ORAL
  Filled 2022-07-11: qty 2

## 2022-07-11 MED ORDER — HEPARIN (PORCINE) IN NACL 1000-0.9 UT/500ML-% IV SOLN
INTRAVENOUS | Status: AC
  Filled 2022-07-11: qty 500

## 2022-07-11 MED ORDER — PHENYLEPHRINE 80 MCG/ML (10ML) SYRINGE FOR IV PUSH (FOR BLOOD PRESSURE SUPPORT)
PREFILLED_SYRINGE | INTRAVENOUS | Status: DC | PRN
  Administered 2022-07-11: 80 ug via INTRAVENOUS

## 2022-07-11 MED ORDER — ROCURONIUM BROMIDE 10 MG/ML (PF) SYRINGE
PREFILLED_SYRINGE | INTRAVENOUS | Status: DC | PRN
  Administered 2022-07-11: 60 mg via INTRAVENOUS

## 2022-07-11 MED ORDER — SODIUM CHLORIDE 0.9 % IV SOLN: INTRAVENOUS | Status: DC

## 2022-07-11 MED ORDER — ONDANSETRON HCL 4 MG/2ML IJ SOLN: 4.0000 mg | Freq: Four times a day (QID) | INTRAMUSCULAR | Status: DC | PRN

## 2022-07-11 MED ORDER — IOHEXOL 350 MG/ML SOLN
INTRAVENOUS | Status: DC | PRN
  Administered 2022-07-11: 15 mL

## 2022-07-11 MED ORDER — FENTANYL CITRATE (PF) 250 MCG/5ML IJ SOLN
INTRAMUSCULAR | Status: DC | PRN
  Administered 2022-07-11 (×2): 50 ug via INTRAVENOUS

## 2022-07-11 MED ORDER — HEPARIN (PORCINE) IN NACL 2000-0.9 UNIT/L-% IV SOLN
INTRAVENOUS | Status: DC | PRN
  Administered 2022-07-11: 1000 mL

## 2022-07-11 MED ORDER — EPHEDRINE SULFATE-NACL 50-0.9 MG/10ML-% IV SOSY
PREFILLED_SYRINGE | INTRAVENOUS | Status: DC | PRN
  Administered 2022-07-11: 5 mg via INTRAVENOUS

## 2022-07-11 MED ORDER — HEPARIN SODIUM (PORCINE) 1000 UNIT/ML IJ SOLN
INTRAMUSCULAR | Status: DC | PRN
  Administered 2022-07-11: 2000 [IU] via INTRAVENOUS
  Administered 2022-07-11: 11000 [IU] via INTRAVENOUS

## 2022-07-11 MED ORDER — PROPOFOL 10 MG/ML IV BOLUS
INTRAVENOUS | Status: DC | PRN
  Administered 2022-07-11: 150 mg via INTRAVENOUS

## 2022-07-11 MED ORDER — LACTATED RINGERS IV SOLN: INTRAVENOUS | Status: DC

## 2022-07-11 MED ORDER — SODIUM CHLORIDE 0.9 % IV SOLN: 250.0000 mL | INTRAVENOUS | Status: DC | PRN

## 2022-07-11 MED ORDER — LIDOCAINE 2% (20 MG/ML) 5 ML SYRINGE
INTRAMUSCULAR | Status: DC | PRN
  Administered 2022-07-11: 80 mg via INTRAVENOUS

## 2022-07-11 MED ORDER — SODIUM CHLORIDE 0.9% FLUSH: 3.0000 mL | Freq: Two times a day (BID) | INTRAVENOUS | Status: DC

## 2022-07-11 MED ORDER — ACETAMINOPHEN 325 MG PO TABS: 650.0000 mg | ORAL_TABLET | ORAL | Status: DC | PRN

## 2022-07-11 SURGICAL SUPPLY — 21 items
CATH INFINITI 5FR ANG PIGTAIL (CATHETERS) IMPLANT
CLOSURE PERCLOSE PROSTYLE (VASCULAR PRODUCTS) IMPLANT
DEVICE WATCHMAN FLX PROC (KITS) IMPLANT
DILATOR VESSEL 38 20CM 14FR (INTRODUCER) IMPLANT
GUIDEWIRE INQWIRE 1.5J.035X260 (WIRE) ×1 IMPLANT
INQWIRE 1.5J .035X260CM (WIRE) ×1
KIT HEART LEFT (KITS) ×1 IMPLANT
KIT SHEA VERSACROSS LAAC CONNE (KITS) IMPLANT
PACK CARDIAC CATHETERIZATION (CUSTOM PROCEDURE TRAY) ×1 IMPLANT
PAD DEFIB RADIO PHYSIO CONN (PAD) ×1 IMPLANT
SHEATH PERFORMER 16FR 30 (SHEATH) IMPLANT
SHEATH PINNACLE 8F 10CM (SHEATH) IMPLANT
SHEATH PROBE COVER 6X72 (BAG) ×1 IMPLANT
SHIELD RADPAD SCOOP 12X17 (MISCELLANEOUS) ×1 IMPLANT
SYS WATCHMAN FXD DBL (SHEATH) ×1
SYSTEM WATCHMAN FXD DBL (SHEATH) IMPLANT
TRANSDUCER W/STOPCOCK (MISCELLANEOUS) ×1 IMPLANT
TUBING CIL FLEX 10 FLL-RA (TUBING) ×1 IMPLANT
WATCHMAN FLX 27 (Prosthesis & Implant Heart) IMPLANT
WATCHMAN FLX PROCEDURE DEVICE (KITS) ×1 IMPLANT
WATCHMAN PROCED TRUSEAL ACCESS (SHEATH) IMPLANT

## 2022-07-11 NOTE — Progress Notes (Signed)
  [facility] TEAM  Patient doing well s/p LAAO closure with Watchman. She is hemodynamically stable. Groin sites stable. ECG with NSR. Plan for early ambulation after bedrest completed and discharge later today.   Kathyrn Drown NP-C Structural Heart Team  Pager: [phone] Phone: [phone]

## 2022-07-11 NOTE — Anesthesia Preprocedure Evaluation (Addendum)
Anesthesia Evaluation  Patient identified by MRN, date of birth, ID band Patient awake    Reviewed: Allergy & Precautions, NPO status , Patient's Chart, lab work & pertinent test results, reviewed documented beta blocker date and time   Airway Mallampati: II  TM Distance: >3 FB Neck ROM: Full    Dental  (+) Teeth Intact, Dental Advisory Given, Caps   Pulmonary sleep apnea and Continuous Positive Airway Pressure Ventilation , former smoker   Pulmonary exam normal breath sounds clear to auscultation       Cardiovascular hypertension, Pt. on medications and Pt. on home beta blockers + angina  + CAD  + dysrhythmias Atrial Fibrillation  Rhythm:Regular Rate:Normal     Neuro/Psych negative neurological ROS  negative psych ROS   GI/Hepatic negative GI ROS, Neg liver ROS,,,  Endo/Other  Obesity   Renal/GU negative Renal ROS     Musculoskeletal  (+) Arthritis ,    Abdominal   Peds  Hematology  (+) Blood dyscrasia (Xarelto)   Anesthesia Other Findings Day of surgery medications reviewed with the patient.  Reproductive/Obstetrics                             Anesthesia Physical Anesthesia Plan  ASA: 3  Anesthesia Plan: General   Post-op Pain Management: Tylenol PO (pre-op)*   Induction: Intravenous  PONV Risk Score and Plan: 4 or greater and 3 and Dexamethasone and Ondansetron  Airway Management Planned: Oral ETT  Additional Equipment: ClearSight  Intra-op Plan:   Post-operative Plan: Extubation in OR  Informed Consent: I have reviewed the patients History and Physical, chart, labs and discussed the procedure including the risks, benefits and alternatives for the proposed anesthesia with the patient or authorized representative who has indicated his/her understanding and acceptance.     Dental advisory given  Plan Discussed with: CRNA  Anesthesia Plan Comments: (2nd PIV)        Anesthesia Quick Evaluation

## 2022-07-11 NOTE — Interval H&P Note (Signed)
History and Physical Interval Note:  07/11/2022 9:26 AM  Colleen Cole  has presented today for surgery, with the diagnosis of atrial fibrillation.  The various methods of treatment have been discussed with the patient and family. After consideration of risks, benefits and other options for treatment, the patient has consented to  Procedure(s): LEFT ATRIAL APPENDAGE OCCLUSION (N/A) TRANSESOPHAGEAL ECHOCARDIOGRAM (N/A) as a surgical intervention.  The patient's history has been reviewed, patient examined, no change in status, stable for surgery.  I have reviewed the patient's chart and labs.  Questions were answered to the patient's satisfaction.     Sherren Mocha

## 2022-07-11 NOTE — Discharge Summary (Signed)
[facility]    Patient ID: Colleen Cole,  MRN: [id number], DOB/AGE: 03-Feb-1946 77 y.o.  Admit date: 07/11/2022 Discharge date: 07/11/2022  Primary Care Physician: Caren Macadam, MD  Primary Cardiologist: Candee Furbish, MD  Electrophysiologist: None  Primary Discharge Diagnosis:  Paroxysmal Atrial Fibrillation Poor candidacy for long term anticoagulation due to high risk hobbies.   Secondary Discharge Diagnosis:  -HTN -CAD s/p PCI to LAD  -OSA on CPAP  Procedures This Admission:  Transeptal Puncture Intra-procedural TEE which showed no LAA thrombus Left atrial appendage occlusive device placement on 07/11/22 by Dr. Burt Knack.   This study demonstrated:  CONCLUSIONS:  Successful implantation of a 27 mm WATCHMAN FLX left atrial appendage occlusive device   2.   No early apparent complications.    Anticoagulation Recommendations: Xarelto 20 mg daily x 6 weeks ASA/Plavix through 6 months after completing 6 weeks of Xarelto Cardiac CTA for surveillance at 2 months Same day DC if criteria met    Brief HPI: Colleen Cole is a 77 y.o. female with a history of HTN, CAD s/p stent to LAD 08/2021, OSA on CPAP, PAF s/p ablation 12/2021 who wished to purse LAAO closure with Watchman secondary to increased risk of bleeding due to rigorous horseback riding and outdoor hobbies.   She is followed by Dr. Marlou Porch for her cardiology care and was felt to be a poor candidate for long term [place] and referred to Dr. Burt Knack for [facility] evaluation.    The patient had a cardiac CTA in 12/2021 for AF ablation planning which showed anatomy suitable for LAAO closure. Medication plan is for Xarelto x 6 weeks post-implantation then follow with ASA and plavix through 6 months.   Hospital Course:  The patient was admitted and underwent left atrial appendage occlusive device placement with 50m Watchman device. She was monitored on telemetry post procedure which demonstrated NSR. Groin site has  been without complication on the day of discharge. Due to her stability, she was considered for same day discharge. Wound care and restrictions were reviewed with the patient. The patient has been scheduled for post procedure follow up with JKathyrn Drown NP in approximately 1 month. Medication plan will be to continue Xarelto x 6 weeks then follow with ASA and Plavix through 6 months (to be confirmed DOS).   Physical Exam: Vitals:   07/11/22 1330 07/11/22 1400 07/11/22 1430 07/11/22 1500  BP: (!) 109/44 (!) 106/40 (!) 104/45 (!) 120/101  Pulse: 78 73 72 79  Resp: 16 15 12 13  $ Temp:      TempSrc:      SpO2: 93% 95% 93% 97%   General: Well developed, well nourished, NAD Cardiovascular: RRR with S1 S2. No murmurs Extremities: No edema. Groin site stable.  Neuro: Alert and oriented. No focal deficits. No facial asymmetry. MAE spontaneously. Psych: Responds to questions appropriately with normal affect.    Labs:   Lab Results  Component Value Date   WBC 4.5 07/09/2022   HGB 11.8 07/09/2022   HCT 35.3 07/09/2022   MCV 89 07/09/2022   PLT 235 07/09/2022    Recent Labs  Lab 07/09/22 0948  NA 136  K 3.7  CL 101  CO2 24  BUN 14  CREATININE 0.83  CALCIUM 9.6  GLUCOSE 103*    Discharge Medications:  Allergies as of 07/11/2022   No Known Allergies      Medication List     TAKE these medications    acetaminophen 500  MG tablet Commonly known as: TYLENOL Take 1,000 mg by mouth every 6 (six) hours as needed for moderate pain.   alendronate 70 MG tablet Commonly known as: FOSAMAX Take 70 mg by mouth every Sunday.   amLODipine 5 MG tablet Commonly known as: NORVASC Take 10 mg by mouth daily.   CALCIUM 600 + D PO Take 1 tablet by mouth 2 (two) times daily.   escitalopram 5 MG tablet Commonly known as: LEXAPRO Take 5 mg by mouth daily.   fluticasone 50 MCG/ACT nasal spray Commonly known as: FLONASE Place 1 spray into both nostrils daily as needed for allergies or  rhinitis.   hydrALAZINE 25 MG tablet Commonly known as: APRESOLINE Take 1 tablet (25 mg total) by mouth 2 (two) times daily. What changed:  how much to take when to take this additional instructions   hydrochlorothiazide 25 MG tablet Commonly known as: HYDRODIURIL Take 25 mg by mouth daily.   levocetirizine 5 MG tablet Commonly known as: XYZAL Take 5 mg by mouth every evening.   losartan 100 MG tablet Commonly known as: COZAAR Take 100 mg by mouth every evening.   LUBRICATING EYE DROPS OP Place 1 drop into both eyes daily as needed (dry eyes).   metoprolol succinate 50 MG 24 hr tablet Commonly known as: TOPROL-XL Take 1 tablet (50 mg total) by mouth daily. Take with or immediately following a meal. What changed: when to take this   nitroGLYCERIN 0.4 MG SL tablet Commonly known as: Nitrostat Place 1 tablet (0.4 mg total) under the tongue every 5 (five) minutes as needed.   rosuvastatin 20 MG tablet Commonly known as: CRESTOR TAKE 1 TABLET(20 MG) BY MOUTH DAILY What changed: See the new instructions.   senna 8.6 MG Tabs tablet Commonly known as: SENOKOT Take 2 tablets by mouth every evening.   STOOL SOFTENER PO Take 2 capsules by mouth every evening.   Topamax 25 MG tablet Generic drug: topiramate Take 25 mg by mouth every evening.   VITAMIN D PO Take 1 capsule by mouth daily.   Xarelto 20 MG Tabs tablet Generic drug: rivaroxaban Take 1 tablet (20 mg total) by mouth daily. Notes to patient: Restart tonight, 07/11/22       Disposition:  Home  Discharge Instructions     Call MD for:  difficulty breathing, headache or visual disturbances   Complete by: As directed    Call MD for:  extreme fatigue   Complete by: As directed    Call MD for:  hives   Complete by: As directed    Call MD for:  persistant dizziness or light-headedness   Complete by: As directed    Call MD for:  persistant nausea and vomiting   Complete by: As directed    Call MD for:   redness, tenderness, or signs of infection (pain, swelling, redness, odor or green/yellow discharge around incision site)   Complete by: As directed    Call MD for:  severe uncontrolled pain   Complete by: As directed    Call MD for:  temperature >100.4   Complete by: As directed    Diet - low sodium heart healthy   Complete by: As directed    Discharge instructions   Complete by: As directed    [facility] Procedure, Care After  Procedure MD: Dr. Armandina Stammer Clinical Coordinator: Lenice Llamas, RN  This sheet gives you information about how to care for yourself after your procedure. Your health care provider may also give  you more specific instructions. If you have problems or questions, contact your health care provider.  What can I expect after the procedure? After the procedure, it is common to have: Bruising around your puncture site. Tenderness around your puncture site. Tiredness (fatigue).  Medication instructions It is very important to continue to take your blood thinner as directed by your doctor after the Watchman procedure. Call your procedure doctor's office with question or concerns. If you are on Coumadin (warfarin), you will have your INR checked the week after your procedure, with a goal INR of 2.0 - 3.0. Please follow your medication instructions on your discharge summary. Only take the medications listed on your discharge paperwork.  Follow up You will be seen in 1 month after your procedure You will have a repeat CT scan approximately 8 weeks after your procedure mark to check your device You will follow up the MD/APP who performed your procedure 6 months after your procedure The Watchman Clinical Coordinator will check in with you from time to time, including 1 and 2 years after your procedure.    Follow these instructions at home: Puncture site care  Follow instructions from your health care provider about how to take care of your puncture site. Make sure  you: If present, leave stitches (sutures), skin glue, or adhesive strips in place.  If a large square bandage is present, this may be removed 24 hours after surgery.  Check your puncture site every day for signs of infection. Check for: Redness, swelling, or pain. Fluid or blood. If your puncture site starts to bleed, lie down on your back, apply firm pressure to the area, and contact your health care provider. Warmth. Pus or a bad smell. Driving Do not drive yourself home if you received sedation Do not drive for at least 4 days after your procedure or however long your health care provider recommends. (Do not resume driving if you have previously been instructed not to drive for other health reasons.) Do not spend greater than 1 hour at a time in a car for the first 3 days. Stop and take a break with a 5 minute walk at least every hour.  Do not drive or use heavy machinery while taking prescription pain medicine.  Activity Avoid activities that take a lot of effort, including exercise, for at least 7 days after your procedure. For the first 3 days, avoid sitting for longer than one hour at a time.  Avoid alcoholic beverages, signing paperwork, or participating in legal proceedings for 24 hours after receiving sedation Do not lift anything that is heavier than 10 lb (4.5 kg) for one week.  No sexual activity for 1 week.  Return to your normal activities as told by your health care provider. Ask your health care provider what activities are safe for you. General instructions Take over-the-counter and prescription medicines only as told by your health care provider. Do not use any products that contain nicotine or tobacco, such as cigarettes and e-cigarettes. If you need help quitting, ask your health care provider. You may shower after 24 hours, but Do not take baths, swim, or use a hot tub for 1 week.  Do not drink alcohol for 24 hours after your procedure. Keep all follow-up visits as  told by your health care provider. This is important. Dental Work: You will require antibiotics prior to any dental work, including cleanings, for 6 months after your Watchman implantation to help protect you from infection. After 6 months, antibiotics  are no longer required. Contact a health care provider if: You have redness, mild swelling, or pain around your puncture site. You have soreness in your throat or at your puncture site that does not improve after several days You have fluid or blood coming from your puncture site that stops after applying firm pressure to the area. Your puncture site feels warm to the touch. You have pus or a bad smell coming from your puncture site. You have a fever. You have chest pain or discomfort that spreads to your neck, jaw, or arm. You are sweating a lot. You feel nauseous. You have a fast or irregular heartbeat. You have shortness of breath. You are dizzy or light-headed and feel the need to lie down. You have pain or numbness in the arm or leg closest to your puncture site. Get help right away if: Your puncture site suddenly swells. Your puncture site is bleeding and the bleeding does not stop after applying firm pressure to the area. These symptoms may represent a serious problem that is an emergency. Do not wait to see if the symptoms will go away. Get medical help right away. Call your local emergency services (911 in the [place] Do not drive yourself to the hospital. Summary After the procedure, it is normal to have bruising and tenderness at the puncture site in your groin, neck, or forearm. Check your puncture site every day for signs of infection. Get help right away if your puncture site is bleeding and the bleeding does not stop after applying firm pressure to the area. This is a medical emergency.  This information is not intended to replace advice given to you by your health care provider. Make sure you discuss any questions you have with  your health care provider.   Increase activity slowly   Complete by: As directed        Follow-up Information     Tommie Raymond, NP Follow up on 08/12/2022.   Specialty: Cardiology Why: @ 915am. Please arrive at [place] information: [address] [address] [phone]                 Duration of Discharge Encounter: Greater than 30 minutes including physician time.  Signed, Kathyrn Drown, NP  07/11/2022 3:28 PM

## 2022-07-11 NOTE — Transfer of Care (Signed)
Immediate Anesthesia Transfer of Care Note  Patient: Colleen Cole  Procedure(s) Performed: LEFT ATRIAL APPENDAGE OCCLUSION TRANSESOPHAGEAL ECHOCARDIOGRAM  Patient Location: Cath Lab  Anesthesia Type:General  Level of Consciousness: awake, alert , and drowsy  Airway & Oxygen Therapy: Patient Spontanous Breathing and Patient connected to nasal cannula oxygen  Post-op Assessment: Report given to RN, Post -op Vital signs reviewed and stable, and Patient moving all extremities X 4  Post vital signs: Reviewed and stable  Last Vitals:  Vitals Value Taken Time  BP 129/52 07/11/22 1123  Temp    Pulse 88 07/11/22 1123  Resp 19 07/11/22 1123  SpO2 90 % 07/11/22 1123  Vitals shown include unvalidated device data.  Last Pain:  Vitals:   07/11/22 0738  TempSrc: Oral  PainSc: 0-No pain         Complications: There were no known notable events for this encounter.

## 2022-07-11 NOTE — TOC Initial Note (Signed)
Transition of Care [facility]) - Initial/Assessment Note    Patient Details  Name: Colleen Cole MRN: [id number] Date of Birth: August 11, 1945  Transition of Care [facility]) CM/SW Contact:    Verdell Carmine, RN Phone Number: 07/11/2022, 2:55 PM  Clinical Narrative:                 Toc has reviewed patient and found no needs. Patient had a Watchman procedure and will likely DC later today, at the latest in the AM. If needs are identified please place a TOC consult        Patient Goals and CMS Choice            Expected Discharge Plan and Services                                              Prior Living Arrangements/Services                       Activities of Daily Living Home Assistive Devices/Equipment: Eyeglasses ADL Screening (condition at time of admission) Patient's cognitive ability adequate to safely complete daily activities?: Yes Is the patient deaf or have difficulty hearing?: No Does the patient have difficulty seeing, even when wearing glasses/contacts?: No Does the patient have difficulty concentrating, remembering, or making decisions?: No Patient able to express need for assistance with ADLs?: Yes Does the patient have difficulty dressing or bathing?: No Independently performs ADLs?: Yes (appropriate for developmental age) Does the patient have difficulty walking or climbing stairs?: No Weakness of Legs: None Weakness of Arms/Hands: None  Permission Sought/Granted                  Emotional Assessment              Admission diagnosis:  Atrial fibrillation ([place]) [I48.91] Patient Active Problem List   Diagnosis Date Noted   Atrial fibrillation ([place]) 07/11/2022   Presence of Watchman left atrial appendage closure device 07/11/2022   Secondary hypercoagulable state ([place]) 01/28/2022   Angina pectoris ([place]) 08/14/2021   Dyspnea on exertion 08/14/2021   Coronary artery disease involving native coronary artery of native heart  with angina pectoris ([place]) 08/14/2021   History of right hip replacement 03/12/2021   Chronic anticoagulation 03/12/2021   OSA (obstructive sleep apnea) 03/12/2021   Abnormal findings diagnostic imaging of heart and coronary circulation 03/21/2020   Carotid bruit 03/21/2020   Paroxysmal atrial fibrillation ([place]) 12/24/2019   Essential hypertension 12/24/2019   Primary osteoarthritis of right hip 08/04/2018   PCP:  Caren Macadam, MD Pharmacy:   [facility], [address] AT [address] [place] [address] Phone: [phone] Fax: [phone]     Social Determinants of Health (SDOH) Social History: SDOH Screenings   Depression (PHQ2-9): Low Risk  (12/05/2021)  Tobacco Use: Medium Risk (07/11/2022)   SDOH Interventions:     Readmission Risk Interventions     No data to display

## 2022-07-11 NOTE — Anesthesia Postprocedure Evaluation (Signed)
Anesthesia Post Note  Patient: Colleen Cole  Procedure(s) Performed: LEFT ATRIAL APPENDAGE OCCLUSION TRANSESOPHAGEAL ECHOCARDIOGRAM     Patient location during evaluation: PACU Anesthesia Type: General Level of consciousness: awake and alert Pain management: pain level controlled Vital Signs Assessment: post-procedure vital signs reviewed and stable Respiratory status: spontaneous breathing, nonlabored ventilation, respiratory function stable and patient connected to nasal cannula oxygen Cardiovascular status: blood pressure returned to baseline and stable Postop Assessment: no apparent nausea or vomiting Anesthetic complications: no   There were no known notable events for this encounter.  Last Vitals:  Vitals:   07/11/22 1430 07/11/22 1500  BP: (!) 104/45 (!) 120/101  Pulse: 72 79  Resp: 12 13  Temp:    SpO2: 93% 97%    Last Pain:  Vitals:   07/11/22 1500  TempSrc:   PainSc: 0-No pain                 Santa Lighter

## 2022-07-11 NOTE — Op Note (Signed)
  [facility]ART AND [facility]ASCULAR CENTER   MULTIDISCIPLINARY HEART VALVE TEAM SURGEON: Sherren Mocha, MD   TEE:  Eleonore Chiquito MD  PREPROCEDURE DIAGNOSIS: 1. Paroxysmal Atrial fibrillation 2. Bleeding risk/High trauma risk    POSTPROCEDURE DIAGNOSIS:   1. Paroxysmal Atrial fibrillation 2. Bleeding risk/High trauma risk   PROCEDURES:  1. Transseptal puncture with RF energy 2. Transesophageal echocardiogram 3. Left atrial appendage occlusive device placement.  INTRODUCTION:  Sanna Ophie Burrowes is a 77 y.o. female with a history of atrial fibrillation who presents for left atrial appendage occlusive device placement.  She has a very active lifestyle and competitively rides horses, placing her at high risk of bleeding complications on long term oral anticoagulation. After a shared decision making conversation, she elected to proceed with Watchman FLX implantation as an alternative to long term [place].   DESCRIPTION OF PROCEDURE: Informed written consent was obtained, and the patient was brought to the hybrid catheterization lab in a fasting state. The patient received general anesthesia as outlined in the anesthesia report.  TEE was performed which revealed trace pericardial effusion and no LAA thrombus.  Measurements of the atrial appendage OS revealed maximal diameter of 19 mm and depth of 18 mm.  Using direct US guidance and a micropuncture technique, the right femoral vein is accessed via a front wall puncture. 2 Perclose devices are deployed at 10' and 2' positions, and a 16-French sheath is placed in the right common femoral vein.  [place] images are captured and digitally stored in the patient's chart. A Baylis Versacross Connect transseptal sheath is advanced through the RCFV into the SVC.  Heparin is administered and a therapeutic ACT is achieved. Using fluroscopic guidance and TEE, transseptal puncture was performed which the puncture made in the inferior and mid portion of the intraatrial septum.   Transseptal puncture was confirmed by LA pressure measurement and TEE under fluoroscopic guidance.   The [facility] double curve 86F access system is positioned in the mid-LA (lot # [id number]). A 6 F pigtail was introduced through the access guide and advanced under fluoroscopy and TEE guidance into the left atrial appendage.  An angiogram was then performed by hand injection of nonionic contrast into the left atrial appendage.  The pigtail and access guide were then advanced to the tip of the appendage.  The pigtail was then removed and exchanged for a [employer] WATCHMAN FLX Device 27 mm (LOT # [id number]) device.  The device is deployed using standard technique. Good position of the device was noted.   PASS Criteria: Final position of the WATCHMAN device revealed that it was located at the LAA ostium.  Tug test was performed and adequate.  Measurements by TEE revealed compression of 18-25%.  Doppler revealed no leak.  Fluoroscopy with angiogram also revealed no leak.  The device was deployed into the LAA using a standard technique.  The access system was then removed from the body and the Perclose sutures are tightened. The sheaths were removed and hemostasis was assured. EBL<86ml.  There were no early apparent complications.   CONCLUSIONS:  Successful implantation of a 27 mm WATCHMAN FLX left atrial appendage occlusive device   2.   No early apparent complications.   Anticoagulation Recommendations: Xarelto 20 mg daily x 6 weeks ASA/Plavix through 6 months after completing 6 weeks of Xarelto Cardiac CTA for surveillance at 2 months Same day DC if criteria met  Sherren Mocha MD 07/11/2022 12:06 PM

## 2022-07-11 NOTE — Anesthesia Procedure Notes (Signed)
Procedure Name: Intubation Date/Time: 07/11/2022 9:46 AM  Performed by: Gaylene Brooks, CRNAPre-anesthesia Checklist: Patient identified, Emergency Drugs available, Suction available and Patient being monitored Patient Re-evaluated:Patient Re-evaluated prior to induction Oxygen Delivery Method: Circle System Utilized Preoxygenation: Pre-oxygenation with 100% oxygen Induction Type: IV induction Ventilation: Mask ventilation without difficulty Laryngoscope Size: Miller and 2 Grade View: Grade I Tube type: Oral Tube size: 7.0 mm Number of attempts: 1 Airway Equipment and Method: Stylet and Oral airway Placement Confirmation: ETT inserted through vocal cords under direct vision, positive ETCO2 and breath sounds checked- equal and bilateral Secured at: 21 cm Tube secured with: Tape Dental Injury: Teeth and Oropharynx as per pre-operative assessment

## 2022-07-12 ENCOUNTER — Encounter (HOSPITAL_COMMUNITY): Payer: Self-pay | Admitting: Cardiovascular Disease

## 2022-07-12 ENCOUNTER — Telehealth: Payer: Self-pay

## 2022-07-12 NOTE — Telephone Encounter (Signed)
Left message to call back  

## 2022-07-15 NOTE — Telephone Encounter (Signed)
  [place] Team  Contacted the patient regarding discharge from [facility] on 07/11/2022  The patient understands to follow up with Kathyrn Drown on 08/12/2022 in preparation for imaging on 09/18/22 (rescheduled from 4/11 while on the phone).  The patient understands discharge instructions? Yes  The patient understands medications and regimen? Yes   The patient reports groin site looks healthy with no S/S of infection or bleeding  The patient understands to call with any questions or concerns prior to scheduled visit.

## 2022-07-22 ENCOUNTER — Telehealth: Payer: Self-pay | Admitting: Cardiology

## 2022-07-22 DIAGNOSIS — I480 Paroxysmal atrial fibrillation: Secondary | ICD-10-CM

## 2022-07-22 MED ORDER — METOPROLOL TARTRATE 25 MG PO TABS: 25.0000 mg | ORAL_TABLET | Freq: Two times a day (BID) | ORAL | 3 refills | Status: DC

## 2022-07-22 NOTE — Telephone Encounter (Signed)
Received a phone call.  She has been feeling faster heart rates at times, up to the 130s with minimal exertion.  She has noted this after switching to Toprol XL 50 mg once a day.  She would like to go back to the metoprolol tartrate 25 mg twice a day.  We will go ahead and send her out a new prescription for metoprolol tartrate 25 mg twice a day.  She will let me know how she is doing.  Candee Furbish, MD

## 2022-08-07 NOTE — Progress Notes (Signed)
[facility]                                     Cardiology Office Note:    Date:  08/12/2022   ID:  Colleen Cole, DOB [birth year], MRN [id number]  PCP:  Caren Macadam, MD  [facility] Cardiologist:  Candee Furbish, MD / Dr. Burt Knack, MD [place])    Referring MD: Caren Macadam, MD   Chief Complaint  Patient presents with   Follow-up    Post LAAO   History of Present Illness:    Colleen Cole is a 77 y.o. female with a hx of HTN, CAD s/p stent to LAD 08/2021, OSA on CPAP, PAF s/p ablation 12/2021 who wished to purse LAAO closure with Watchman secondary to increased risk of bleeding due to rigorous horseback riding and outdoor hobbies.    Colleen Cole is followed by Dr. Marlou Porch for her cardiology care and was felt to be a poor candidate for long term [place] and referred to Dr. Burt Knack for [facility] evaluation. She had a cardiac CTA in 12/2021 for AF ablation planning which showed anatomy suitable for LAAO closure.   She presented to [facility] and underwent LAAO closure with Watchman 07/11/22 with 25mm Watchman device. She did well post procedure and was discharge same day. She was restarted on Xarelto x 6 weeks (through 08/25/22) then follow with ASA and Plavix through 6 months (01/09/23).   Today she is here and reports that she has been well since implant. She does report some minor chest "pressure" post procedure however this is resolving. No dizziness, or SOB. She also has been having intermittent palpitations. In reviewing her Apple watch EKG tracings it appears that she has been having increased PACs. We discussed increasing her beat blocker for this. BP on the lower side therefore will make adjustments to antihypertensives. She denies LE edema, orthopnea, bleeding in stool or urine, dizziness or syncope.   Past Medical History:  Diagnosis Date   Carotid disease, bilateral ([place])    Coronary artery disease    Hyperlipidemia    Hypertension    Paroxysmal A-fib (HCC)    Presence of  Watchman left atrial appendage closure device 07/11/2022   56mm Watchman implant with Dr. Burt Knack    Past Surgical History:  Procedure Laterality Date   ATRIAL FIBRILLATION ABLATION N/A 12/26/2021   Procedure: ATRIAL FIBRILLATION ABLATION;  Surgeon: Constance Haw, MD;  Location: [place] [facility];  Service: Cardiovascular;  Laterality: N/A;   BREAST EXCISIONAL BIOPSY Right 1995   NEG   CORONARY STENT INTERVENTION N/A 08/27/2021   Procedure: CORONARY STENT INTERVENTION;  Surgeon: Sherren Mocha, MD;  Location: [place] [facility];  Service: Cardiovascular;  Laterality: N/A;   FRACTURE SURGERY  2007   fracture in left leg-at patella, tibia and fibula   INTRAVASCULAR PRESSURE WIRE/FFR STUDY N/A 08/27/2021   Procedure: INTRAVASCULAR PRESSURE WIRE/FFR STUDY;  Surgeon: Sherren Mocha, MD;  Location: [place] [facility];  Service: Cardiovascular;  Laterality: N/A;   LEFT ATRIAL APPENDAGE OCCLUSION N/A 07/11/2022   Procedure: LEFT ATRIAL APPENDAGE OCCLUSION;  Surgeon: Sherren Mocha, MD;  Location: [place] [facility];  Service: Cardiovascular;  Laterality: N/A;   LEFT HEART CATH AND CORONARY ANGIOGRAPHY N/A 08/27/2021   Procedure: LEFT HEART CATH AND CORONARY ANGIOGRAPHY;  Surgeon: Sherren Mocha, MD;  Location: [place] [facility];  Service: Cardiovascular;  Laterality: N/A;  TEE WITHOUT CARDIOVERSION N/A 07/11/2022   Procedure: TRANSESOPHAGEAL ECHOCARDIOGRAM;  Surgeon: Sherren Mocha, MD;  Location: [place] [facility];  Service: Cardiovascular;  Laterality: N/A;   TOTAL HIP ARTHROPLASTY Right 08/04/2018   Procedure: TOTAL HIP ARTHROPLASTY ANTERIOR APPROACH;  Surgeon: Melrose Nakayama, MD;  Location: [facility];  Service: Orthopedics;  Laterality: Right;    Current Medications: Current Meds  Medication Sig   acetaminophen (TYLENOL) 500 MG tablet Take 1,000 mg by mouth every 6 (six) hours as needed for moderate pain.   alendronate (FOSAMAX) 70 MG tablet Take 70 mg by mouth every Sunday.    amoxicillin (AMOXIL) 500 MG tablet Take 4 tablets (2,000 mg total) by mouth as directed. 1 HOUR PRIOR TO DENTAL APPOINTMENTS   aspirin EC 81 MG tablet Take 1 tablet (81 mg total) by mouth daily. Swallow whole. START ON 3/25   Calcium Carb-Cholecalciferol (CALCIUM 600 + D PO) Take 1 tablet by mouth 2 (two) times daily.   Carboxymethylcellul-Glycerin (LUBRICATING EYE DROPS OP) Place 1 drop into both eyes daily as needed (dry eyes).   clopidogrel (PLAVIX) 75 MG tablet Take 1 tablet (75 mg total) by mouth daily. START ON 3/25   Docusate Calcium (STOOL SOFTENER PO) Take 2 capsules by mouth every evening.   escitalopram (LEXAPRO) 5 MG tablet Take 5 mg by mouth daily.   fluticasone (FLONASE) 50 MCG/ACT nasal spray Place 1 spray into both nostrils daily as needed for allergies or rhinitis.   hydrALAZINE (APRESOLINE) 25 MG tablet Take 1 tablet (25 mg total) by mouth 2 (two) times daily. (Patient taking differently: Take 25-50 mg by mouth See admin instructions. 2 tabs in the am, 1 at noon, 2 tabs at pm)   hydrochlorothiazide (HYDRODIURIL) 25 MG tablet Take 25 mg by mouth daily.   levocetirizine (XYZAL) 5 MG tablet Take 5 mg by mouth every evening.   losartan (COZAAR) 100 MG tablet Take 100 mg by mouth every evening.   metoprolol tartrate (LOPRESSOR) 50 MG tablet Take 1 tablet (50 mg total) by mouth 2 (two) times daily.   nitroGLYCERIN (NITROSTAT) 0.4 MG SL tablet Place 1 tablet (0.4 mg total) under the tongue every 5 (five) minutes as needed.   rosuvastatin (CRESTOR) 20 MG tablet TAKE 1 TABLET(20 MG) BY MOUTH DAILY (Patient taking differently: Take 20 mg by mouth every evening.)   senna (SENOKOT) 8.6 MG TABS tablet Take 2 tablets by mouth every evening.   TOPAMAX 25 MG tablet Take 25 mg by mouth every evening.   VITAMIN D PO Take 1 capsule by mouth daily.   [DISCONTINUED] amLODipine (NORVASC) 5 MG tablet Take 10 mg by mouth daily.   [DISCONTINUED] metoprolol tartrate (LOPRESSOR) 25 MG tablet Take 1 tablet  (25 mg total) by mouth 2 (two) times daily.   [DISCONTINUED] XARELTO 20 MG TABS tablet Take 1 tablet (20 mg total) by mouth daily.     Allergies:   Patient has no known allergies.   Social History   Socioeconomic History   Marital status: Single    Spouse name: Not on file   Number of children: Not on file   Years of education: Not on file   Highest education level: Not on file  Occupational History   Not on file  Tobacco Use   Smoking status: Former    Types: Cigarettes    Quit date: 07/13/1998    Years since quitting: 24.0   Smokeless tobacco: Never   Tobacco comments:    Former smoker 01/28/22  Vaping  Use   Vaping Use: Never used  Substance and Sexual Activity   Alcohol use: Yes    Alcohol/week: 2.0 standard drinks of alcohol    Types: 2 Standard drinks or equivalent per week    Comment: 2 glasses of wine weekly 01/28/22   Drug use: Never   Sexual activity: Not on file  Other Topics Concern   Not on file  Social History Narrative   Not on file   Social Determinants of Health   Financial Resource Strain: Not on file  Food Insecurity: Not on file  Transportation Needs: Not on file  Physical Activity: Not on file  Stress: Not on file  Social Connections: Not on file   Family History: The patient's family history is negative for Breast cancer.  ROS:   Please see the history of present illness.    All other systems reviewed and are negative.  EKGs/Labs/Other Studies Reviewed:    The following studies were reviewed today:  Procedures This Admission:  Transeptal Puncture Intra-procedural TEE which showed no LAA thrombus Left atrial appendage occlusive device placement on 07/11/22 by Dr. Burt Knack.    This study demonstrated:   CONCLUSIONS:  Successful implantation of a 27 mm WATCHMAN FLX left atrial appendage occlusive device   2.   No early apparent complications.    Anticoagulation Recommendations: Xarelto 20 mg daily x 6 weeks ASA/Plavix through 6  months after completing 6 weeks of Xarelto Cardiac CTA for surveillance at 2 months Same day DC if criteria met    EKG:  EKG is not ordered today.   Recent Labs: 07/09/2022: BUN 14; Creatinine, Ser 0.83; Hemoglobin 11.8; Platelets 235; Potassium 3.7; Sodium 136   Recent Lipid Panel    Component Value Date/Time   CHOL 151 06/12/2021 0814   TRIG 63 06/12/2021 0814   HDL 81 06/12/2021 0814   CHOLHDL 1.9 06/12/2021 0814   LDLCALC 57 06/12/2021 0814   Physical Exam:    VS:  BP 110/60   Pulse 67   Ht 5\' 2"  (1.575 m)   Wt 181 lb (82.1 kg)   SpO2 95%   BMI 33.11 kg/m     Wt Readings from Last 3 Encounters:  08/12/22 181 lb (82.1 kg)  07/08/22 184 lb 6.4 oz (83.6 kg)  04/01/22 183 lb (83 kg)    General: Well developed, well nourished, NAD Lungs:Clear to ausculation bilaterally. No wheezes, rales, or rhonchi. Breathing is unlabored. Cardiovascular: RRR with S1 S2. No murmurs Extremities: No edema.  Neuro: Alert and oriented. No focal deficits. No facial asymmetry. MAE spontaneously. Psych: Responds to questions appropriately with normal affect.    ASSESSMENT/PLAN:    Paroxsymal atrial fibrillation: Underwent LAAO closure with Watchman 07/11/22 with 25mm Watchman device. She was restarted on Xarelto and will continue this through 3/24 then stop and transition to ASA 81 and Plavix 75 through 6 months (01/09/23). CT instruction letter reviewed. Obtain BMET today. Dental SBE sent to pharmacy. She will need this through 6 months.   Palpitations: Noted to have increased frequency of palpitations with Apple EKG showing PACs. Plan to stop amlodipine today and increased metoprolol to 50mg  BID. She will monitor her BP at home and report any decreases.   CAD: s/p stent to LAD 08/2021. Will stop Xarelto and start ASA and Plavix. After 6 months of DAPT, she will require ASA indefinitely. Continue beta blocker, statin.     Medication Adjustments/Labs and Tests Ordered: Current medicines are  reviewed at length with the patient today.  Concerns regarding medicines are outlined above.  Orders Placed This Encounter  Procedures   Basic metabolic panel   Meds ordered this encounter  Medications   XARELTO 20 MG TABS tablet    Sig: Take 1 tablet (20 mg total) by mouth daily.    Dispense:  90 tablet    Refill:  3    STOP ON 3/25, START PLAVIX AND ASPIRIN ON 3/25   clopidogrel (PLAVIX) 75 MG tablet    Sig: Take 1 tablet (75 mg total) by mouth daily. START ON 3/25    Dispense:  90 tablet    Refill:  3    STOP XARELTO ON 3/24 AND START ASA AND PLAVIX ON 3/25   aspirin EC 81 MG tablet    Sig: Take 1 tablet (81 mg total) by mouth daily. Swallow whole. START ON 3/25    Dispense:  90 tablet    Refill:  3    STOP XARELTO ON 3/24 AND START ASA AND PLAVIX ON 3/25   metoprolol tartrate (LOPRESSOR) 50 MG tablet    Sig: Take 1 tablet (50 mg total) by mouth 2 (two) times daily.    Dispense:  180 tablet    Refill:  3   amoxicillin (AMOXIL) 500 MG tablet    Sig: Take 4 tablets (2,000 mg total) by mouth as directed. 1 HOUR PRIOR TO DENTAL APPOINTMENTS    Dispense:  12 tablet    Refill:  6    Patient Instructions  Medication Instructions:  Your physician has recommended you make the following change in your medication:  STOP AMLODIPINE INCREASE METOPROLOL TO 50 MG TWICE DAILY. STOP XARELTO ON 3/24 START PLAVIX 75 ON 3/25 START ASA ON 3/24 START AMOXICILLIN 500 MG - TAKE 2000 MG (4 TABLETS) 1 HOUR PRIOR TO DENTAL APPOINTMENTS AND PROCEDURES   *If you need a refill on your cardiac medications before your next appointment, please call your pharmacy*   Lab Work: TODAY: BMET If you have labs (blood work) drawn today and your tests are completely normal, you will receive your results only by: [place] (if you have MyChart) OR A paper copy in the mail If you have any lab test that is abnormal or we need to change your treatment, we will call you to review the  results.   Testing/Procedures: SEE IN CT INSTRUCTION LETTER   Follow-Up: At [facility], you and your health needs are our priority.  As part of our continuing mission to provide you with exceptional heart care, we have created designated Provider Care Teams.  These Care Teams include your primary Cardiologist (physician) and Advanced Practice Providers (APPs -  Physician Assistants and Nurse Practitioners) who all work together to provide you with the care you need, when you need it.  We recommend signing up for the patient portal called "MyChart".  Sign up information is provided on this After Visit Summary.  MyChart is used to connect with patients for Virtual Visits (Telemedicine).  Patients are able to view lab/test results, encounter notes, upcoming appointments, etc.  Non-urgent messages can be sent to your provider as well.   To learn more about what you can do with MyChart, go to [email or web address].    Your next appointment:   KEEP SCHEDULED FOLLOW-UP   Signed, Kathyrn Drown, NP  08/12/2022 9:59 AM    [place]

## 2022-08-12 ENCOUNTER — Ambulatory Visit: Payer: Medicare Other | Attending: Cardiology | Admitting: Cardiology

## 2022-08-12 VITALS — BP 110/60 | HR 67 | Ht 62.0 in | Wt 181.0 lb

## 2022-08-12 DIAGNOSIS — R002 Palpitations: Secondary | ICD-10-CM | POA: Diagnosis present

## 2022-08-12 DIAGNOSIS — I10 Essential (primary) hypertension: Secondary | ICD-10-CM | POA: Insufficient documentation

## 2022-08-12 DIAGNOSIS — Z955 Presence of coronary angioplasty implant and graft: Secondary | ICD-10-CM | POA: Diagnosis present

## 2022-08-12 DIAGNOSIS — I25119 Atherosclerotic heart disease of native coronary artery with unspecified angina pectoris: Secondary | ICD-10-CM | POA: Diagnosis present

## 2022-08-12 DIAGNOSIS — Z95818 Presence of other cardiac implants and grafts: Secondary | ICD-10-CM | POA: Diagnosis present

## 2022-08-12 DIAGNOSIS — I480 Paroxysmal atrial fibrillation: Secondary | ICD-10-CM | POA: Diagnosis present

## 2022-08-12 MED ORDER — XARELTO 20 MG PO TABS: 20.0000 mg | ORAL_TABLET | Freq: Every day | ORAL | 3 refills | Status: DC

## 2022-08-12 MED ORDER — AMOXICILLIN 500 MG PO TABS: 2000.0000 mg | ORAL_TABLET | ORAL | 6 refills | Status: DC

## 2022-08-12 MED ORDER — CLOPIDOGREL BISULFATE 75 MG PO TABS: 75.0000 mg | ORAL_TABLET | Freq: Every day | ORAL | 3 refills | Status: DC

## 2022-08-12 MED ORDER — ASPIRIN 81 MG PO TBEC: 81.0000 mg | DELAYED_RELEASE_TABLET | Freq: Every day | ORAL | 3 refills | Status: DC

## 2022-08-12 MED ORDER — METOPROLOL TARTRATE 50 MG PO TABS: 50.0000 mg | ORAL_TABLET | Freq: Two times a day (BID) | ORAL | 3 refills | Status: DC

## 2022-08-12 NOTE — Patient Instructions (Addendum)
Medication Instructions:  Your physician has recommended you make the following change in your medication:  STOP AMLODIPINE INCREASE METOPROLOL TO 50 MG TWICE DAILY. STOP XARELTO ON 3/24 START PLAVIX 75 ON 3/25 START ASA ON 3/25 START AMOXICILLIN 500 MG - TAKE 2000 MG (4 TABLETS) 1 HOUR PRIOR TO DENTAL APPOINTMENTS AND PROCEDURES   *If you need a refill on your cardiac medications before your next appointment, please call your pharmacy*   Lab Work: TODAY: BMET If you have labs (blood work) drawn today and your tests are completely normal, you will receive your results only by: [place] (if you have MyChart) OR A paper copy in the mail If you have any lab test that is abnormal or we need to change your treatment, we will call you to review the results.   Testing/Procedures: SEE IN CT INSTRUCTION LETTER   Follow-Up: At [facility], you and your health needs are our priority.  As part of our continuing mission to provide you with exceptional heart care, we have created designated Provider Care Teams.  These Care Teams include your primary Cardiologist (physician) and Advanced Practice Providers (APPs -  Physician Assistants and Nurse Practitioners) who all work together to provide you with the care you need, when you need it.  We recommend signing up for the patient portal called "MyChart".  Sign up information is provided on this After Visit Summary.  MyChart is used to connect with patients for Virtual Visits (Telemedicine).  Patients are able to view lab/test results, encounter notes, upcoming appointments, etc.  Non-urgent messages can be sent to your provider as well.   To learn more about what you can do with MyChart, go to [email or web address].    Your next appointment:   KEEP SCHEDULED FOLLOW-UP

## 2022-08-17 LAB — BASIC METABOLIC PANEL
BUN/Creatinine Ratio: 20 (ref 12–28)
BUN: 20 mg/dL (ref 8–27)
CO2: 17 mmol/L — ABNORMAL LOW (ref 20–29)
Calcium: 10.9 mg/dL — ABNORMAL HIGH (ref 8.7–10.3)
Chloride: 116 mmol/L (ref 96–106)
Creatinine, Ser: 1 mg/dL (ref 0.57–1.00)
Glucose: 117 mg/dL — ABNORMAL HIGH (ref 70–99)
Potassium: 4.1 mmol/L (ref 3.5–5.2)
Sodium: 138 mmol/L (ref 134–144)
eGFR: 59 mL/min/{1.73_m2} — ABNORMAL LOW (ref >59)

## 2022-08-19 ENCOUNTER — Telehealth: Payer: Self-pay

## 2022-08-19 DIAGNOSIS — E878 Other disorders of electrolyte and fluid balance, not elsewhere classified: Secondary | ICD-10-CM

## 2022-08-19 NOTE — Telephone Encounter (Signed)
Spoke with Dr Burt Knack who implanted watchman due to critical lab.  He advises pt return for a repeat BMET.  Spoke with pt and advised of lab result and recommendation.  Pt verbalizes understanding and  appt.scheduled for 08/20/2022.

## 2022-08-20 ENCOUNTER — Ambulatory Visit: Payer: Medicare Other | Attending: Cardiovascular Disease

## 2022-08-20 DIAGNOSIS — E878 Other disorders of electrolyte and fluid balance, not elsewhere classified: Secondary | ICD-10-CM

## 2022-08-21 LAB — BASIC METABOLIC PANEL
BUN/Creatinine Ratio: 23 (ref 12–28)
BUN: 19 mg/dL (ref 8–27)
CO2: 22 mmol/L (ref 20–29)
Calcium: 9.8 mg/dL (ref 8.7–10.3)
Chloride: 104 mmol/L (ref 96–106)
Creatinine, Ser: 0.81 mg/dL (ref 0.57–1.00)
Glucose: 97 mg/dL (ref 70–99)
Potassium: 4.2 mmol/L (ref 3.5–5.2)
Sodium: 140 mmol/L (ref 134–144)
eGFR: 76 mL/min/{1.73_m2} (ref >59)

## 2022-09-04 ENCOUNTER — Other Ambulatory Visit: Payer: Self-pay | Admitting: Cardiology

## 2022-09-05 ENCOUNTER — Other Ambulatory Visit (HOSPITAL_COMMUNITY): Payer: Medicare Other

## 2022-09-12 ENCOUNTER — Other Ambulatory Visit (HOSPITAL_COMMUNITY): Payer: Medicare Other

## 2022-09-17 ENCOUNTER — Telehealth (HOSPITAL_COMMUNITY): Payer: Self-pay | Admitting: *Deleted

## 2022-09-17 NOTE — Telephone Encounter (Signed)
Patient calling about her upcoming cardiac imaging study; pt verbalizes understanding of appt date/time, parking situation and where to check in, pre-test NPO status, and verified current allergies; name and call back number provided for further questions should they arise  Larey Brick RN Navigator Cardiac Imaging [facility] [phone] office [phone] cell  Patient had same study in July 2023 without incident. She is aware to arrive at 11:30am.

## 2022-09-18 ENCOUNTER — Ambulatory Visit (HOSPITAL_COMMUNITY)
Admission: RE | Admit: 2022-09-18 | Discharge: 2022-09-18 | Disposition: A | Discharge status: Home / Self Care | Payer: Medicare Other | Source: Ambulatory Visit | Attending: Cardiovascular Disease | Admitting: Cardiovascular Disease

## 2022-09-18 ENCOUNTER — Telehealth: Payer: Self-pay

## 2022-09-18 ENCOUNTER — Ambulatory Visit: Payer: Medicare Other

## 2022-09-18 ENCOUNTER — Telehealth: Payer: Self-pay | Admitting: Cardiovascular Disease

## 2022-09-18 DIAGNOSIS — Z95818 Presence of other cardiac implants and grafts: Secondary | ICD-10-CM | POA: Insufficient documentation

## 2022-09-18 DIAGNOSIS — R9389 Abnormal findings on diagnostic imaging of other specified body structures: Secondary | ICD-10-CM | POA: Insufficient documentation

## 2022-09-18 DIAGNOSIS — I480 Paroxysmal atrial fibrillation: Secondary | ICD-10-CM | POA: Insufficient documentation

## 2022-09-18 LAB — D-DIMER, QUANTITATIVE: D-DIMER: 1.52 mg/L FEU — ABNORMAL HIGH (ref 0.00–0.49)

## 2022-09-18 MED ORDER — IOHEXOL 350 MG/ML SOLN
100.0000 mL | Freq: Once | INTRAVENOUS | Status: AC | PRN
  Administered 2022-09-18: 100 mL via INTRAVENOUS

## 2022-09-18 NOTE — Telephone Encounter (Signed)
CT overread reported "Nonocclusive thrombus at the segmental bifurcation of the medial right middle lobe pulmonary artery." The patient stopped her Xarelto 20 mg qd and started Plavix and ASA 08/26/22.  Discussed with Dr. Excell Seltzer and Dr. Izora Ribas. Will bring the patient in for ddimer today. VQ scan scheduled for tomorrow morning. Since Ms. Colleen Cole is asymptomatic, will wait to switch back to Xarelto pending results.  Ms. Colleen Cole agreed with plan and will come to the office now for STAT blood draw.

## 2022-09-18 NOTE — Telephone Encounter (Signed)
Ddimer result is elevated at 1.52.  Discussed with Georgie Chard. Will await VQ results tomorrow morning.

## 2022-09-18 NOTE — Telephone Encounter (Signed)
Dr. Alfredo Batty from [facility] is requesting to speak to either Dr. Excell Seltzer or DOD in regards to critical result from CT.

## 2022-09-19 ENCOUNTER — Ambulatory Visit (HOSPITAL_COMMUNITY)
Admission: RE | Admit: 2022-09-19 | Discharge: 2022-09-19 | Disposition: A | Discharge status: Home / Self Care | Payer: Medicare Other | Source: Ambulatory Visit | Attending: Cardiovascular Disease | Admitting: Cardiovascular Disease

## 2022-09-19 DIAGNOSIS — R9389 Abnormal findings on diagnostic imaging of other specified body structures: Secondary | ICD-10-CM

## 2022-09-19 MED ORDER — TECHNETIUM TO 99M ALBUMIN AGGREGATED
4.2000 | Freq: Once | INTRAVENOUS | Status: AC | PRN
  Administered 2022-09-19: 4.2 via INTRAVENOUS

## 2022-09-19 NOTE — Telephone Encounter (Signed)
Called Radiology and asked to expedite VQ scan. Called and updated the patient. She feels fine, has no CP or SOB but is anxious.  Informed her Georgie Chard will call her first thing in the morning for an update and plan.   The patient wishes for Dr. Anne Fu and Dr. Excell Seltzer to be part of the decision making for plan going forward if her VQ scan is positive.

## 2022-09-20 ENCOUNTER — Telehealth: Payer: Self-pay | Admitting: Cardiology

## 2022-09-20 NOTE — Telephone Encounter (Signed)
Patient underwent post LAAO closure CT which showed no device leak or thrombus however imaging showed nonocclusive thrombus at the segmental bifurcation of the medial right middle lobe pulmonary artery. As notes indicate, results discussed with Dr. Excell Cole and Dr. Izora Cole who recommended follow up D-dimer and VQ scan. D-dimer resulted at 1.52 and VQ/ nuclear medicine pulmonary perfusion imaging demonstrated no perfusion defects in either lung. Results shared with patient. Plan to continue current post Watchman therapy with ASA and Plavix. She has follow up with Dr. Anne Cole on 10/21/22 at 0920am.    Colleen Chard NP-C Structural Heart Team  Pager: [phone] Phone: [phone]

## 2022-10-21 ENCOUNTER — Ambulatory Visit: Payer: Medicare Other | Attending: Cardiology | Admitting: Cardiology

## 2022-10-21 ENCOUNTER — Encounter: Payer: Self-pay | Admitting: Cardiology

## 2022-10-21 ENCOUNTER — Ambulatory Visit (INDEPENDENT_AMBULATORY_CARE_PROVIDER_SITE_OTHER): Payer: Medicare Other | Admitting: Cardiology

## 2022-10-21 VITALS — BP 114/70 | HR 57 | Ht 62.0 in | Wt 187.0 lb

## 2022-10-21 VITALS — BP 112/60 | HR 60 | Ht 62.0 in | Wt 185.2 lb

## 2022-10-21 DIAGNOSIS — D6869 Other thrombophilia: Secondary | ICD-10-CM | POA: Insufficient documentation

## 2022-10-21 DIAGNOSIS — I480 Paroxysmal atrial fibrillation: Secondary | ICD-10-CM | POA: Insufficient documentation

## 2022-10-21 DIAGNOSIS — I25119 Atherosclerotic heart disease of native coronary artery with unspecified angina pectoris: Secondary | ICD-10-CM | POA: Diagnosis present

## 2022-10-21 DIAGNOSIS — I10 Essential (primary) hypertension: Secondary | ICD-10-CM | POA: Diagnosis present

## 2022-10-21 DIAGNOSIS — Z955 Presence of coronary angioplasty implant and graft: Secondary | ICD-10-CM

## 2022-10-21 DIAGNOSIS — Z95818 Presence of other cardiac implants and grafts: Secondary | ICD-10-CM | POA: Diagnosis present

## 2022-10-21 DIAGNOSIS — I2510 Atherosclerotic heart disease of native coronary artery without angina pectoris: Secondary | ICD-10-CM | POA: Diagnosis present

## 2022-10-21 NOTE — Progress Notes (Signed)
Electrophysiology Office Note   Date:  10/21/2022   ID:  Colleen Cole, DOB April 06, 1946, MRN [id number]  PCP:  Aliene Beams, MD  Cardiologist:  Anne Fu Primary Electrophysiologist:  Murle Otting Jorja Loa, MD    Chief Complaint: AF   History of Present Illness: Colleen Cole is a 77 y.o. female who is being seen today for the evaluation of AF at the request of Aliene Beams, MD. Presenting today for electrophysiology evaluation.  She has a history significant for atrial fibrillation, hypertension, coronary artery disease.  She is post LAD stent.  She is now post ablation 12/26/2021.  Today, denies symptoms of palpitations, chest pain, shortness of breath, orthopnea, PND, lower extremity edema, claudication, dizziness, presyncope, syncope, bleeding, or neurologic sequela. The patient is tolerating medications without difficulties.  Since her ablation she has done well.  She has noted no further episodes of atrial fibrillation.  Is able to continue to do all of her daily activities without restriction.   Past Medical History:  Diagnosis Date   Carotid disease, bilateral (HCC)    Coronary artery disease    Hyperlipidemia    Hypertension    Paroxysmal A-fib (HCC)    Presence of Watchman left atrial appendage closure device 07/11/2022   27mm Watchman implant with Dr. Excell Seltzer   Past Surgical History:  Procedure Laterality Date   ATRIAL FIBRILLATION ABLATION N/A 12/26/2021   Procedure: ATRIAL FIBRILLATION ABLATION;  Surgeon: Regan Lemming, MD;  Location: [facility] INVASIVE CV LAB;  Service: Cardiovascular;  Laterality: N/A;   BREAST EXCISIONAL BIOPSY Right 1995   NEG   CORONARY PRESSURE/FFR STUDY N/A 08/27/2021   Procedure: INTRAVASCULAR PRESSURE WIRE/FFR STUDY;  Surgeon: Tonny Bollman, MD;  Location: [facility] INVASIVE CV LAB;  Service: Cardiovascular;  Laterality: N/A;   CORONARY STENT INTERVENTION N/A 08/27/2021   Procedure: CORONARY STENT INTERVENTION;  Surgeon: Tonny Bollman,  MD;  Location: [facility] INVASIVE CV LAB;  Service: Cardiovascular;  Laterality: N/A;   FRACTURE SURGERY  2007   fracture in left leg-at patella, tibia and fibula   LEFT ATRIAL APPENDAGE OCCLUSION N/A 07/11/2022   Procedure: LEFT ATRIAL APPENDAGE OCCLUSION;  Surgeon: Tonny Bollman, MD;  Location: [facility] INVASIVE CV LAB;  Service: Cardiovascular;  Laterality: N/A;   LEFT HEART CATH AND CORONARY ANGIOGRAPHY N/A 08/27/2021   Procedure: LEFT HEART CATH AND CORONARY ANGIOGRAPHY;  Surgeon: Tonny Bollman, MD;  Location: [facility] INVASIVE CV LAB;  Service: Cardiovascular;  Laterality: N/A;   TEE WITHOUT CARDIOVERSION N/A 07/11/2022   Procedure: TRANSESOPHAGEAL ECHOCARDIOGRAM;  Surgeon: Tonny Bollman, MD;  Location: [facility] INVASIVE CV LAB;  Service: Cardiovascular;  Laterality: N/A;   TOTAL HIP ARTHROPLASTY Right 08/04/2018   Procedure: TOTAL HIP ARTHROPLASTY ANTERIOR APPROACH;  Surgeon: Marcene Corning, MD;  Location: [facility] ORS;  Service: Orthopedics;  Laterality: Right;     Current Outpatient Medications  Medication Sig Dispense Refill   acetaminophen (TYLENOL) 500 MG tablet Take 1,000 mg by mouth every 6 (six) hours as needed for moderate pain.     alendronate (FOSAMAX) 70 MG tablet Take 70 mg by mouth every Sunday.     amoxicillin (AMOXIL) 500 MG tablet Take 4 tablets (2,000 mg total) by mouth as directed. 1 HOUR PRIOR TO DENTAL APPOINTMENTS 12 tablet 6   aspirin EC 81 MG tablet Take 1 tablet (81 mg total) by mouth daily. Swallow whole. START ON 3/25 90 tablet 3   BREO ELLIPTA 100-25 MCG/ACT AEPB 1 puff every morning.     Calcium Carb-Cholecalciferol (CALCIUM 600 + D PO)  Take 1 tablet by mouth 2 (two) times daily.     Carboxymethylcellul-Glycerin (LUBRICATING EYE DROPS OP) Place 1 drop into both eyes daily as needed (dry eyes).     clopidogrel (PLAVIX) 75 MG tablet Take 1 tablet (75 mg total) by mouth daily. START ON 3/25 90 tablet 3   Docusate Calcium (STOOL SOFTENER PO) Take 2 capsules by mouth every evening.      escitalopram (LEXAPRO) 5 MG tablet Take 5 mg by mouth daily.     fluticasone (FLONASE) 50 MCG/ACT nasal spray Place 1 spray into both nostrils daily as needed for allergies or rhinitis.     hydrALAZINE (APRESOLINE) 25 MG tablet Take 1 tablet (25 mg total) by mouth 2 (two) times daily. (Patient taking differently: Take 25-50 mg by mouth See admin instructions. 2 tabs in the am, 1 at noon, 2 tabs at pm) 180 tablet 3   hydrochlorothiazide (HYDRODIURIL) 25 MG tablet Take 25 mg by mouth daily.     levocetirizine (XYZAL) 5 MG tablet Take 5 mg by mouth every evening.     losartan (COZAAR) 100 MG tablet Take 100 mg by mouth every evening.     metoprolol tartrate (LOPRESSOR) 50 MG tablet Take 1 tablet (50 mg total) by mouth 2 (two) times daily. 180 tablet 3   nitroGLYCERIN (NITROSTAT) 0.4 MG SL tablet Place 1 tablet (0.4 mg total) under the tongue every 5 (five) minutes as needed. 25 tablet 2   rosuvastatin (CRESTOR) 20 MG tablet Take 1 tablet (20 mg total) by mouth every evening. 90 tablet 3   senna (SENOKOT) 8.6 MG TABS tablet Take 2 tablets by mouth every evening.     VITAMIN D PO Take 1 capsule by mouth daily.     No current facility-administered medications for this visit.    Allergies:   Patient has no known allergies.   Social History:  The patient  reports that she quit smoking about 24 years ago. Her smoking use included cigarettes. She has never used smokeless tobacco. She reports current alcohol use of about 2.0 standard drinks of alcohol per week. She reports that she does not use drugs.   Family History:  The patient's family history is not on file.   ROS:  Please see the history of present illness.   Otherwise, review of systems is positive for none.   All other systems are reviewed and negative.   PHYSICAL EXAM: VS:  BP 114/70   Pulse (!) 57   Ht 5\' 2"  (1.575 m)   Wt 187 lb (84.8 kg)   SpO2 96%   BMI 34.20 kg/m  , BMI Body mass index is 34.2 kg/m. GEN: Well nourished, well  developed, in no acute distress  HEENT: normal  Neck: no JVD, carotid bruits, or masses Cardiac: RRR; no murmurs, rubs, or gallops,no edema  Respiratory:  clear to auscultation bilaterally, normal work of breathing GI: soft, nontender, nondistended, + BS MS: no deformity or atrophy  Skin: warm and dry Neuro:  Strength and sensation are intact Psych: euthymic mood, full affect  EKG:  EKG is ordered today. Personal review of the ekg ordered shows sinus rhythm   Recent Labs: 07/09/2022: Hemoglobin 11.8; Platelets 235 08/20/2022: BUN 19; Creatinine, Ser 0.81; Potassium 4.2; Sodium 140    Lipid Panel     Component Value Date/Time   CHOL 151 06/12/2021 0814   TRIG 63 06/12/2021 0814   HDL 81 06/12/2021 0814   CHOLHDL 1.9 06/12/2021 0814   LDLCALC 57  06/12/2021 0814     Wt Readings from Last 3 Encounters:  10/21/22 187 lb (84.8 kg)  10/21/22 185 lb 3.2 oz (84 kg)  08/12/22 181 lb (82.1 kg)      Other studies Reviewed: Additional studies/ records that were reviewed today include: TTE 08/21/21  Review of the above records today demonstrates:   1. Left ventricular ejection fraction, by estimation, is 60 to 65%. The  left ventricle has normal function. The left ventricle has no regional  wall motion abnormalities. Left ventricular diastolic parameters were  normal. The average left ventricular  global longitudinal strain is 27.3 %. The global longitudinal strain is  normal.   2. Right ventricular systolic function is normal. The right ventricular  size is normal. There is normal pulmonary artery systolic pressure. The  estimated right ventricular systolic pressure is 26.8 mmHg.   3. Left atrial size was moderately dilated.   4. The mitral valve is normal in structure. Trivial mitral valve  regurgitation. No evidence of mitral stenosis.   5. The aortic valve is tricuspid. Aortic valve regurgitation is not  visualized. No aortic stenosis is present.   6. The inferior vena cava is  normal in size with greater than 50%  respiratory variability, suggesting right atrial pressure of 3 mmHg.   LHC 08/27/21 Severe stenosis of the LAD just after the first septal perforator, treated successfully with pressure wire guided PCI using a 2.5 x 20 mm Synergy DES Widely patent left main, left circumflex, and RCA with mild nonobstructive plaquing  ASSESSMENT AND PLAN:  1.  Paroxysmal atrial fibrillation: Currently on Xarelto.  CHA2DS2-VASc of 4.  Post ablation 12/26/2021.  She has remained in sinus rhythm.  She has had no further episodes of atrial fibrillation.  Chaun Uemura continue with current management.  2.  Coronary artery disease: Post LAD stent.  Plan per primary cardiology  3.  Hypertension: well controlled  4.  Second hypercoagulable state: Currently on Xarelto for atrial fibrillation   Current medicines are reviewed at length with the patient today.   The patient does not have concerns regarding her medicines.  The following changes were made today: none  Labs/ tests ordered today include:  Orders Placed This Encounter  Procedures   EKG 12-Lead     Disposition:   FU 6 months  Signed, Johnnie Goynes Jorja Loa, MD  10/21/2022 11:35 AM     [facility] [address] [place] [address] [phone] (office) [phone] (fax)

## 2022-10-21 NOTE — Progress Notes (Signed)
Cardiology Office Note:    Date:  10/21/2022   ID:  Colleen Cole, DOB [birth year], MRN [id number]  PCP:  Aliene Beams, MD   [facility] Providers Cardiologist:  Donato Schultz, MD Electrophysiologist:  Will Jorja Loa, MD     Referring MD: Aliene Beams, MD    History of Present Illness:    Colleen Cole is a 77 y.o. female with #27 Watchman device 07/11/2022, paroxysmal atrial fibrillation status post ablation Dr. Elberta Fortis 12/26/2020, coronary artery disease stent to LAD 08/2021, OSA on CPAP, here for follow-up.  Original coronary artery disease was picked up after symptoms of shortness of breath/chest discomfort, prior calcium score that was elevated in the LAD distribution which subsequently led to a coronary CTA that showed FFR positive LAD lesion.  Was having episodes of shortness of breath, underwent CT scan of chest.  A small subsegmental pulmonary artery thrombosis was reported out however VQ scan was normal and multiple colleagues reviewed CT imagery and felt as though this finding may be spurious/highly sensitive.  Nonetheless, she is doing well.  Recently had some excellent rides with her horses.  Now off Xarelto.  Overall doing well today.  Shortness of breath is markedly improved with Breo Ellipta 25 mcg she states.   Past Medical History:  Diagnosis Date   Carotid disease, bilateral (HCC)    Coronary artery disease    Hyperlipidemia    Hypertension    Paroxysmal A-fib (HCC)    Presence of Watchman left atrial appendage closure device 07/11/2022   27mm Watchman implant with Dr. Excell Seltzer    Past Surgical History:  Procedure Laterality Date   ATRIAL FIBRILLATION ABLATION N/A 12/26/2021   Procedure: ATRIAL FIBRILLATION ABLATION;  Surgeon: Regan Lemming, MD;  Location: [facility] INVASIVE CV LAB;  Service: Cardiovascular;  Laterality: N/A;   BREAST EXCISIONAL BIOPSY Right 1995   NEG   CORONARY PRESSURE/FFR STUDY N/A 08/27/2021   Procedure: INTRAVASCULAR  PRESSURE WIRE/FFR STUDY;  Surgeon: Tonny Bollman, MD;  Location: [facility] INVASIVE CV LAB;  Service: Cardiovascular;  Laterality: N/A;   CORONARY STENT INTERVENTION N/A 08/27/2021   Procedure: CORONARY STENT INTERVENTION;  Surgeon: Tonny Bollman, MD;  Location: [facility] INVASIVE CV LAB;  Service: Cardiovascular;  Laterality: N/A;   FRACTURE SURGERY  2007   fracture in left leg-at patella, tibia and fibula   LEFT ATRIAL APPENDAGE OCCLUSION N/A 07/11/2022   Procedure: LEFT ATRIAL APPENDAGE OCCLUSION;  Surgeon: Tonny Bollman, MD;  Location: [facility] INVASIVE CV LAB;  Service: Cardiovascular;  Laterality: N/A;   LEFT HEART CATH AND CORONARY ANGIOGRAPHY N/A 08/27/2021   Procedure: LEFT HEART CATH AND CORONARY ANGIOGRAPHY;  Surgeon: Tonny Bollman, MD;  Location: [facility] INVASIVE CV LAB;  Service: Cardiovascular;  Laterality: N/A;   TEE WITHOUT CARDIOVERSION N/A 07/11/2022   Procedure: TRANSESOPHAGEAL ECHOCARDIOGRAM;  Surgeon: Tonny Bollman, MD;  Location: [facility] INVASIVE CV LAB;  Service: Cardiovascular;  Laterality: N/A;   TOTAL HIP ARTHROPLASTY Right 08/04/2018   Procedure: TOTAL HIP ARTHROPLASTY ANTERIOR APPROACH;  Surgeon: Marcene Corning, MD;  Location: [facility] ORS;  Service: Orthopedics;  Laterality: Right;    Current Medications: Current Meds  Medication Sig   acetaminophen (TYLENOL) 500 MG tablet Take 1,000 mg by mouth every 6 (six) hours as needed for moderate pain.   alendronate (FOSAMAX) 70 MG tablet Take 70 mg by mouth every Sunday.   amoxicillin (AMOXIL) 500 MG tablet Take 4 tablets (2,000 mg total) by mouth as directed. 1 HOUR PRIOR TO DENTAL APPOINTMENTS   aspirin EC 81  MG tablet Take 1 tablet (81 mg total) by mouth daily. Swallow whole. START ON 3/25   BREO ELLIPTA 100-25 MCG/ACT AEPB 1 puff every morning.   Calcium Carb-Cholecalciferol (CALCIUM 600 + D PO) Take 1 tablet by mouth 2 (two) times daily.   Carboxymethylcellul-Glycerin (LUBRICATING EYE DROPS OP) Place 1 drop into both eyes daily as needed (dry eyes).    clopidogrel (PLAVIX) 75 MG tablet Take 1 tablet (75 mg total) by mouth daily. START ON 3/25   Docusate Calcium (STOOL SOFTENER PO) Take 2 capsules by mouth every evening.   escitalopram (LEXAPRO) 5 MG tablet Take 5 mg by mouth daily.   fluticasone (FLONASE) 50 MCG/ACT nasal spray Place 1 spray into both nostrils daily as needed for allergies or rhinitis.   hydrALAZINE (APRESOLINE) 25 MG tablet Take 1 tablet (25 mg total) by mouth 2 (two) times daily. (Patient taking differently: Take 25-50 mg by mouth See admin instructions. 2 tabs in the am, 1 at noon, 2 tabs at pm)   hydrochlorothiazide (HYDRODIURIL) 25 MG tablet Take 25 mg by mouth daily.   levocetirizine (XYZAL) 5 MG tablet Take 5 mg by mouth every evening.   losartan (COZAAR) 100 MG tablet Take 100 mg by mouth every evening.   metoprolol tartrate (LOPRESSOR) 50 MG tablet Take 1 tablet (50 mg total) by mouth 2 (two) times daily.   nitroGLYCERIN (NITROSTAT) 0.4 MG SL tablet Place 1 tablet (0.4 mg total) under the tongue every 5 (five) minutes as needed.   rosuvastatin (CRESTOR) 20 MG tablet Take 1 tablet (20 mg total) by mouth every evening.   senna (SENOKOT) 8.6 MG TABS tablet Take 2 tablets by mouth every evening.   VITAMIN D PO Take 1 capsule by mouth daily.     Allergies:   Patient has no known allergies.   Social History   Socioeconomic History   Marital status: Single    Spouse name: Not on file   Number of children: Not on file   Years of education: Not on file   Highest education level: Not on file  Occupational History   Not on file  Tobacco Use   Smoking status: Former    Types: Cigarettes    Quit date: 07/13/1998    Years since quitting: 24.2   Smokeless tobacco: Never   Tobacco comments:    Former smoker 01/28/22  Vaping Use   Vaping Use: Never used  Substance and Sexual Activity   Alcohol use: Yes    Alcohol/week: 2.0 standard drinks of alcohol    Types: 2 Standard drinks or equivalent per week    Comment: 2  glasses of wine weekly 01/28/22   Drug use: Never   Sexual activity: Not on file  Other Topics Concern   Not on file  Social History Narrative   Not on file   Social Determinants of Health   Financial Resource Strain: Not on file  Food Insecurity: Not on file  Transportation Needs: Not on file  Physical Activity: Not on file  Stress: Not on file  Social Connections: Not on file     Family History: The patient's family history is negative for Breast cancer.  ROS:   Please see the history of present illness.     All other systems reviewed and are negative.  EKGs/Labs/Other Studies Reviewed:    The following studies were reviewed today: Cardiac Studies & Procedures   CARDIAC CATHETERIZATION  CARDIAC CATHETERIZATION 08/27/2021  Narrative Severe stenosis of the LAD just after  the first septal perforator, treated successfully with pressure wire guided PCI using a 2.5 x 20 mm Synergy DES Widely patent left main, left circumflex, and RCA with mild nonobstructive plaquing  Recommendations: Continue clopidogrel x6 months, resume rivaroxaban tomorrow.  Would avoid aspirin in the setting of chronic oral anticoagulation plus clopidogrel in order to avoid excessive bleeding risk.  Findings Coronary Findings Diagnostic  Dominance: Right  Left Main The vessel exhibits minimal luminal irregularities.  Left Anterior Descending Prox LAD to Mid LAD lesion is 80% stenosed. The lesion is eccentric. The lesion is mildly calcified.  Left Circumflex The vessel exhibits minimal luminal irregularities.  Right Coronary Artery The vessel exhibits minimal luminal irregularities. Mid RCA lesion is 30% stenosed.  Intervention  Prox LAD to Mid LAD lesion Stent CATH LAUNCHER 27F EBU3.5 guide catheter was inserted. Lesion crossed with guidewire using a GUIDEWIRE PRESSURE X 175. Pre-stent angioplasty was performed using a BALLN SAPPHIRE 2.5X15. A drug-eluting stent was successfully placed using  a SYNERGY XD 2.50X20. Post-stent angioplasty was performed using a BALLN [facility] EUPHORA RX [id number]. Post-Intervention Lesion Assessment The intervention was successful. Pre-interventional TIMI flow is 3. Post-intervention TIMI flow is 3. No complications occurred at this lesion. There is a 0% residual stenosis post intervention.     ECHOCARDIOGRAM  ECHOCARDIOGRAM COMPLETE 08/21/2021  Narrative ECHOCARDIOGRAM REPORT    Patient Name:   Eimaan Hasler Date of Exam: 08/21/2021 Medical Rec #:  [id number]         Height:       63.0 in Accession #:    [id number]        Weight:       186.0 lb Date of Birth:  [birth year]         BSA:          1.875 m Patient Age:    75 years          BP:           124/64 mmHg Patient Gender: F                 HR:           68 bpm. Exam Location:  [address]  Procedure: 2D Echo, 3D Echo, Cardiac Doppler, Color Doppler and Strain Analysis  Indications:    R06.09 Dyspnea  History:        Patient has prior history of Echocardiogram examinations, most recent 01/11/2020. Arrythmias:Atrial Fibrillation, Signs/Symptoms:Dyspnea, Shortness of Breath and Fatigue; Risk Factors:Hypertension, Former Smoker, Dyslipidemia and Family History of Coronary Artery Disease. COVID 19 (september 2022) with residual SOB and Fatigue.  Sonographer:    Farrel Conners RDCS Referring Phys: Jake Bathe  IMPRESSIONS   1. Left ventricular ejection fraction, by estimation, is 60 to 65%. The left ventricle has normal function. The left ventricle has no regional wall motion abnormalities. Left ventricular diastolic parameters were normal. The average left ventricular global longitudinal strain is 27.3 %. The global longitudinal strain is normal. 2. Right ventricular systolic function is normal. The right ventricular size is normal. There is normal pulmonary artery systolic pressure. The estimated right ventricular systolic pressure is 26.8 mmHg. 3. Left atrial size was moderately  dilated. 4. The mitral valve is normal in structure. Trivial mitral valve regurgitation. No evidence of mitral stenosis. 5. The aortic valve is tricuspid. Aortic valve regurgitation is not visualized. No aortic stenosis is present. 6. The inferior vena cava is normal in size with greater than 50% respiratory variability, suggesting right atrial pressure  of 3 mmHg.  FINDINGS Left Ventricle: Left ventricular ejection fraction, by estimation, is 60 to 65%. The left ventricle has normal function. The left ventricle has no regional wall motion abnormalities. The average left ventricular global longitudinal strain is 27.3 %. The global longitudinal strain is normal. The left ventricular internal cavity size was normal in size. There is no left ventricular hypertrophy. Left ventricular diastolic parameters were normal.  Right Ventricle: The right ventricular size is normal. No increase in right ventricular wall thickness. Right ventricular systolic function is normal. There is normal pulmonary artery systolic pressure. The tricuspid regurgitant velocity is 2.44 m/s, and with an assumed right atrial pressure of 3 mmHg, the estimated right ventricular systolic pressure is 26.8 mmHg.  Left Atrium: Left atrial size was moderately dilated.  Right Atrium: Right atrial size was normal in size.  Pericardium: There is no evidence of pericardial effusion.  Mitral Valve: The mitral valve is normal in structure. Trivial mitral valve regurgitation. No evidence of mitral valve stenosis.  Tricuspid Valve: The tricuspid valve is normal in structure. Tricuspid valve regurgitation is trivial.  Aortic Valve: The aortic valve is tricuspid. Aortic valve regurgitation is not visualized. No aortic stenosis is present.  Pulmonic Valve: The pulmonic valve was normal in structure. Pulmonic valve regurgitation is trivial.  Aorta: The aortic root is normal in size and structure.  Venous: The inferior vena cava is normal in  size with greater than 50% respiratory variability, suggesting right atrial pressure of 3 mmHg.  IAS/Shunts: No atrial level shunt detected by color flow Doppler.   LEFT VENTRICLE PLAX 2D LVIDd:         4.50 cm   Diastology LVIDs:         2.40 cm   LV e' medial:    11.30 cm/s LV PW:         1.05 cm   LV E/e' medial:  10.6 LV IVS:        0.85 cm   LV e' lateral:   11.90 cm/s LVOT diam:     2.10 cm   LV E/e' lateral: 10.1 LV SV:         85 LV SV Index:   46        2D Longitudinal Strain LVOT Area:     3.46 cm  2D Strain GLS (A2C):   27.5 % 2D Strain GLS (A3C):   25.5 % 2D Strain GLS (A4C):   29.0 % 2D Strain GLS Avg:     27.3 %  3D Volume EF: 3D EF:        63 % LV EDV:       80 ml LV ESV:       30 ml LV SV:        50 ml  RIGHT VENTRICLE RV S prime:     22.70 cm/s TAPSE (M-mode): 2.2 cm  LEFT ATRIUM             Index        RIGHT ATRIUM           Index LA diam:        4.50 cm 2.40 cm/m   RA Area:     17.70 cm LA Vol (A2C):   93.9 ml 50.08 ml/m  RA Volume:   47.00 ml  25.07 ml/m LA Vol (A4C):   77.2 ml 41.18 ml/m LA Biplane Vol: 88.1 ml 46.99 ml/m AORTIC VALVE LVOT Vmax:   117.33 cm/s LVOT Vmean:  75.167  cm/s LVOT VTI:    0.247 m  AORTA Ao Root diam: 2.90 cm Ao Asc diam:  3.40 cm  MITRAL VALVE                TRICUSPID VALVE MV Area (PHT): cm          TR Peak grad:   23.8 mmHg MV Decel Time: 182 msec     TR Vmax:        244.00 cm/s MV E velocity: 120.00 cm/s MV A velocity: 72.25 cm/s   SHUNTS MV E/A ratio:  1.66         Systemic VTI:  0.25 m Systemic Diam: 2.10 cm  Dalton McleanMD Electronically signed by Wilfred Lacy Signature Date/Time: 08/21/2021/2:38:54 PM    Final   TEE  ECHO TEE 07/11/2022  Narrative TRANSESOPHOGEAL ECHO REPORT    Patient Name:   ASSATA SHOAFF Ruotolo Date of Exam: 07/11/2022 Medical Rec #:  [id number]         Height:       62.0 in Accession #:    [id number]        Weight:       184.4 lb Date of Birth:  July 20, 1945         BSA:           1.847 m Patient Age:    76 years          BP:           115/54 mmHg Patient Gender: F                 HR:           70 bpm. Exam Location:  Inpatient  Procedure: Transesophageal Echo, 3D Echo, Color Doppler and Cardiac Doppler  Indications:     I48.2 Chronic atrial fibrillation  History:         Patient has prior history of Echocardiogram examinations, most recent 09/19/2020. CHF; Risk Factors:Hypertension and Diabetes.  Sonographer:     Irving Burton Senior RDCS Referring Phys:  [id number] MICHAEL COOPER Diagnosing Phys: Lennie Odor MD   Sonographer Comments: 27mm Watchman Device Implanted   PROCEDURE: After discussion of the risks and benefits of a TEE, an informed consent was obtained from the patient. The transesophogeal probe was passed without difficulty through the esophogus of the patient. Sedation performed by different physician. The patient was monitored while under deep sedation. Image quality was excellent. The patient's vital signs; including heart rate, blood pressure, and oxygen saturation; remained stable throughout the procedure. The patient developed no complications during the procedure.  IMPRESSIONS   1. TEE guided left atrial appendage occlusion. 27 mm Watchman FLX placed with 22% compression. No device leak. A small iatrogenic ASD was present after the case with L to R shunting. A trivial pericardial effusion was present before and after the case. No immediate complications. 2. Left ventricular ejection fraction, by estimation, is 55 to 60%. The left ventricle has normal function. 3. Right ventricular systolic function is normal. The right ventricular size is normal. 4. No left atrial/left atrial appendage thrombus was detected. 5. The mitral valve is grossly normal. Trivial mitral valve regurgitation. No evidence of mitral stenosis. 6. The aortic valve is tricuspid. Aortic valve regurgitation is not visualized. No aortic stenosis is present. 7. There is mild  (Grade II) layered plaque involving the descending aorta.  FINDINGS Left Ventricle: Left ventricular ejection fraction, by estimation, is 55 to 60%. The left ventricle has normal function. The left  ventricular internal cavity size was normal in size.  Right Ventricle: The right ventricular size is normal. No increase in right ventricular wall thickness. Right ventricular systolic function is normal.  Left Atrium: Left atrial size was normal in size. No left atrial/left atrial appendage thrombus was detected.  Right Atrium: Right atrial size was normal in size.  Pericardium: Trivial pericardial effusion is present.  Mitral Valve: The mitral valve is grossly normal. Trivial mitral valve regurgitation. No evidence of mitral valve stenosis.  Tricuspid Valve: The tricuspid valve is grossly normal. Tricuspid valve regurgitation is mild . No evidence of tricuspid stenosis.  Aortic Valve: The aortic valve is tricuspid. Aortic valve regurgitation is not visualized. No aortic stenosis is present.  Pulmonic Valve: The pulmonic valve was grossly normal. Pulmonic valve regurgitation is not visualized. No evidence of pulmonic stenosis.  Aorta: The aortic root and ascending aorta are structurally normal, with no evidence of dilitation. There is mild (Grade II) layered plaque involving the descending aorta.  Venous: The left upper pulmonary vein, left lower pulmonary vein, right lower pulmonary vein and right upper pulmonary vein are normal.  IAS/Shunts: The atrial septum is grossly normal.  Additional Comments: Spectral Doppler performed.   AORTA Ao Root diam: 2.92 cm Ao Asc diam:  3.24 cm  Lennie Odor MD Electronically signed by Lennie Odor MD Signature Date/Time: 07/11/2022/12:06:28 PM    Final    CT SCANS  CT CORONARY MORPH W/CTA COR W/SCORE 08/22/2021  Addendum 08/22/2021 10:26 PM ADDENDUM REPORT: 08/22/2021 22:24  CLINICAL DATA:  77 year old White  Female  EXAM: Cardiac/Coronary  CTA  TECHNIQUE: The patient was scanned on a [employer].  FINDINGS: Scan was triggered in the descending thoracic aorta. Axial non-contrast 3 mm slices were carried out through the heart. The data set was analyzed on a dedicated work station and scored using the Agatson method. Gantry rotation speed was 250 msecs and collimation was .6 mm. 0.8 mg of sl NTG was given. The 3D data set was reconstructed in 5% intervals of the 67-82 % of the R-R cycle. Diastolic phases were analyzed on a dedicated work station using MPR, MIP and VRT modes. The patient received 100 cc of contrast.  Aorta:  Normal size.  Aortic atherosclerosis.  No dissection.  Main Pulmonary Artery: Normal size of the pulmonary artery.  Aortic Valve:  Tri-leaflet.  No calcifications.  Coronary Arteries:  Normal coronary origin.  Right dominance.  Coronary Calcium Score:  Left main: 0  Left anterior descending artery: 270  Left circumflex artery: 17  Right coronary artery: 680  Total: 967  Percentile: 95th for age, sex, and race matched control.  RCA is a large dominant artery that gives rise to PDA and PLA. Mild calcified plaque in the middle and distal vessel.  Left main is a large artery that gives rise to LAD and LCX arteries. Mild distal calcified plaque.  LAD is a large vessel that gives rise to one large D1 Branch. Severe calcified stenosis mid LAD followed by tandem mild calcified lesion. Minimal calcified proximal plaque. Mild calcified plaque in the proximal D1.  LCX is a non-dominant artery that gives rise to one large OM1 branch. Mild soft plaque in proximal OM1.  Other findings:  Normal pulmonary vein drainage into the left atrium.  Normal left atrial appendage without a thrombus.  Extra-cardiac findings: See attached radiology report for non-cardiac structures.  IMPRESSION: 1. Coronary calcium score of 967. This was 95th percentile for  age, sex,  and race matched control.  2. Normal coronary origin with right dominance.  3. CAD-RADS 4 Severe stenosis. (70-99% or > 50% left main). Cardiac catheterization or CT FFR is recommended. Consider symptom-guided anti-ischemic pharmacotherapy as well as risk factor modification per guideline directed care.  RECOMMENDATIONS:  Coronary artery calcium (CAC) score is a strong predictor of incident coronary heart disease (CHD) and provides predictive information beyond traditional risk factors. CAC scoring is reasonable to use in the decision to withhold, postpone, or initiate statin therapy in intermediate-risk or selected borderline-risk asymptomatic adults (age 54-75 years and LDL-C >=70 to <190 mg/dL) who do not have diabetes or established atherosclerotic cardiovascular disease (ASCVD).* In intermediate-risk (10-year ASCVD risk >=7.5% to <20%) adults or selected borderline-risk (10-year ASCVD risk >=5% to <7.5%) adults in whom a CAC score is measured for the purpose of making a treatment decision the following recommendations have been made:  If CAC = 0, it is reasonable to withhold statin therapy and reassess in 5 to 10 years, as long as higher risk conditions are absent (diabetes mellitus, family history of premature CHD in first degree relatives (males <55 years; females <65 years), cigarette smoking, LDL >=190 mg/dL or other independent risk factors).  If CAC is 1 to 99, it is reasonable to initiate statin therapy for patients >=58 years of age.  If CAC is >=100 or >=75th percentile, it is reasonable to initiate statin therapy at any age.  Cardiology referral should be considered for patients with CAC scores =400 or >=75th percentile.  *2018 AHA/ACC/AACVPR/AAPA/ABC/ACPM/ADA/AGS/APhA/ASPC/NLA/PCNA Guideline on the Management of Blood Cholesterol: A Report of the American College of Cardiology/American Heart Association Task Force on Clinical Practice Guidelines. J  Am Coll Cardiol. 2019;73(24):3168-3209.  Riley Lam, MD   Electronically Signed By: Riley Lam M.D. On: 08/22/2021 22:24  Narrative EXAM: OVER-READ INTERPRETATION  CT CHEST  The following report is an over-read performed by radiologist Dr. Irish Lack of [facility] on 08/22/2021. This over-read does not include interpretation of cardiac or coronary anatomy or pathology. The coronary CTA interpretation by the cardiologist is attached.  COMPARISON:  Prior coronary calcium score study on 01/11/2020  FINDINGS: Vascular: Atherosclerosis of the descending thoracic aorta.  Mediastinum/Nodes: Visualized mediastinum and hilar regions demonstrate no lymphadenopathy or masses.  Lungs/Pleura: Visualized lungs show no evidence of pulmonary edema, consolidation, pneumothorax, nodule or pleural fluid.  Upper Abdomen: Focal area of increased arterial phase perfusion in the peripheral subcapsular aspect of the right lobe of the liver on images 53-58 likely represents a focal perfusion abnormality.  Musculoskeletal: No chest wall mass or suspicious bone lesions identified.  IMPRESSION: 1. Atherosclerosis of the thoracic aorta. 2. Focal area of increased arterial perfusion in the subcapsular right lobe of the liver likely representing a focal perfusion abnormality given appearance.  Electronically Signed: By: Irish Lack M.D. On: 08/22/2021 16:53   CT SCANS  CT CARDIAC SCORING (SELF PAY ONLY) 01/11/2020  Addendum 01/11/2020  5:57 PM ADDENDUM REPORT: 01/11/2020 17:54  CLINICAL DATA:  60F with hypertension, family history of CAD and PAF for risk stratification  EXAM: Coronary Calcium Score  TECHNIQUE: The patient was scanned on a [employer]. Axial non-contrast 3 mm slices were carried out through the heart. The data set was analyzed on a dedicated work station and scored using the Agatson  method.  FINDINGS: Non-cardiac: See separate report from [facility].  Aorta: Normal size. Ascending aorta 3.2 cm. Mild calcification of the aortic root and aortic arch. Moderate calcification  in the descending aorta.  Pericardium: Normal  Coronary arteries: Normal anatomy. Right dominant. Calcification noted in all three coronary distributions and the distal left main.  IMPRESSION: Coronary calcium score of 711. This was 93rd percentile for age and sex matched control.  Calcification noted in all three coronary distributions and the distal left main.  Recommend aggressive risk factor modification including LDL goal <70.  Chilton Si, MD   Electronically Signed By: Chilton Si On: 01/11/2020 17:54  Narrative EXAM: OVER-READ INTERPRETATION  CT CHEST  The following report is an over-read performed by radiologist Dr. Charlett Nose of [facility] on 01/11/2020. This over-read does not include interpretation of cardiac or coronary anatomy or pathology. The coronary calcium score interpretation by the cardiologist is attached.  COMPARISON:  None.  FINDINGS: Vascular: Heart is normal size. Aorta normal caliber. Calcifications throughout the aorta, most pronounced in the descending thoracic aorta.  Mediastinum/Nodes: No adenopathy.  Lungs/Pleura: No confluent opacities or effusions. Scarring in the medial posterior right lower lobe. No effusions.  Upper Abdomen: Imaging into the upper abdomen demonstrates no acute findings.  Musculoskeletal: Chest wall soft tissues are unremarkable. No acute bony abnormality.  IMPRESSION: Aortic atherosclerosis.  No acute findings.  Electronically Signed: By: Charlett Nose M.D. On: 01/11/2020 15:09           EKG:  No new  Recent Labs: 07/09/2022: Hemoglobin 11.8; Platelets 235 08/20/2022: BUN 19; Creatinine, Ser 0.81; Potassium 4.2; Sodium 140  Recent Lipid Panel    Component Value  Date/Time   CHOL 151 06/12/2021 0814   TRIG 63 06/12/2021 0814   HDL 81 06/12/2021 0814   CHOLHDL 1.9 06/12/2021 0814   LDLCALC 57 06/12/2021 0814     Risk Assessment/Calculations:               Physical Exam:    VS:  BP 112/60   Pulse 60   Ht 5\' 2"  (1.575 m)   Wt 185 lb 3.2 oz (84 kg)   SpO2 94%   BMI 33.87 kg/m     Wt Readings from Last 3 Encounters:  10/21/22 185 lb 3.2 oz (84 kg)  08/12/22 181 lb (82.1 kg)  07/08/22 184 lb 6.4 oz (83.6 kg)     GEN:  Well nourished, well developed in no acute distress HEENT: Normal NECK: No JVD; No carotid bruits LYMPHATICS: No lymphadenopathy CARDIAC: RRR, no murmurs, rubs, gallops RESPIRATORY:  Clear to auscultation without rales, wheezing or rhonchi  ABDOMEN: Soft, non-tender, non-distended MUSCULOSKELETAL:  No edema; No deformity  SKIN: Warm and dry NEUROLOGIC:  Alert and oriented x 3 PSYCHIATRIC:  Normal affect   ASSESSMENT:    1. Paroxysmal atrial fibrillation (HCC)   2. Presence of Watchman left atrial appendage closure device   3. S/P coronary artery stent placement   4. Coronary artery disease involving native coronary artery of native heart with angina pectoris [facility])    PLAN:    In order of problems listed above:  Paroxysmal atrial fibrillation, Camnitz July 2023, post Elmer Sow February 2024 -Doing well post ablation. Now off of Xarelto.  Coronary artery disease - LAD stent Excell Seltzer 08/2021.  Stable.  Doing well.  Goal-directed medical therapy.  Aspirin indefinitely.LDL 51  Palpitations - Previously noted PACs on Apple Watch back in March 2024.  Amlodipine was stopped and metoprolol was increased to 50 mg twice a day.  Hyperlipidemia - Continue with statin therapy.  LDL goal less than 70. LDL 51  Shortness of breath -  She tried Breo Ellipta 25 mcg inhaler prior to exercise and this seemed to help significantly she states.  She is going to see pulmonary for testing as well by Dr. Malena Peer  referral.  Right lower quadrant abdominal discomfort - Normal workup, CT on 10/08/2022 in [place].  Her CT scan mentioned under liver several low-density nodules in the liver too small to be characterized.  These are likely benign but should be characterized with MRI of the liver on a nonemergent basis.  Ultrasound is doubtful that it would yield diagnostic value as lesions are relatively small.  She will discuss this further with Dr. Tracie Harrier.  If she needs our assistance she will let us know.        Medication Adjustments/Labs and Tests Ordered: Current medicines are reviewed at length with the patient today.  Concerns regarding medicines are outlined above.  No orders of the defined types were placed in this encounter.  No orders of the defined types were placed in this encounter.   Patient Instructions  Medication Instructions:  The current medical regimen is effective;  continue present plan and medications.  *If you need a refill on your cardiac medications before your next appointment, please call your pharmacy*  Follow-Up: At [facility], you and your health needs are our priority.  As part of our continuing mission to provide you with exceptional heart care, we have created designated Provider Care Teams.  These Care Teams include your primary Cardiologist (physician) and Advanced Practice Providers (APPs -  Physician Assistants and Nurse Practitioners) who all work together to provide you with the care you need, when you need it.  We recommend signing up for the patient portal called "MyChart".  Sign up information is provided on this After Visit Summary.  MyChart is used to connect with patients for Virtual Visits (Telemedicine).  Patients are able to view lab/test results, encounter notes, upcoming appointments, etc.  Non-urgent messages can be sent to your provider as well.   To learn more about what you can do with MyChart, go to [email or web address].     Your next appointment:   6 month(s)  Provider:   Donato Schultz, MD       Signed, Donato Schultz, MD  10/21/2022 10:31 AM    [facility]

## 2022-10-21 NOTE — Patient Instructions (Signed)
Medication Instructions:  °Your physician recommends that you continue on your current medications as directed. Please refer to the Current Medication list given to you today. ° °*If you need a refill on your cardiac medications before your next appointment, please call your pharmacy* ° ° °Lab Work: °None ordered ° ° °Testing/Procedures: °None ordered ° ° °Follow-Up: °At [facility], you and your health needs are our priority.  As part of our continuing mission to provide you with exceptional heart care, we have created designated Provider Care Teams.  These Care Teams include your primary Cardiologist (physician) and Advanced Practice Providers (APPs -  Physician Assistants and Nurse Practitioners) who all work together to provide you with the care you need, when you need it. ° °Your next appointment:   °6 month(s) ° °The format for your next appointment:   °In Person ° °Provider:   °Will Camnitz, MD ° ° ° °Thank you for choosing [facility]!! ° ° °Rever Pichette, RN °[phone] °  °

## 2022-10-21 NOTE — Patient Instructions (Signed)
Medication Instructions:  The current medical regimen is effective;  continue present plan and medications.  *If you need a refill on your cardiac medications before your next appointment, please call your pharmacy*  Follow-Up: At  [facility]Care, you and your health needs are our priority.  As part of our continuing mission to provide you with exceptional heart care, we have created designated Provider Care Teams.  These Care Teams include your primary Cardiologist (physician) and Advanced Practice Providers (APPs -  Physician Assistants and Nurse Practitioners) who all work together to provide you with the care you need, when you need it.  We recommend signing up for the patient portal called "MyChart".  Sign up information is provided on this After Visit Summary.  MyChart is used to connect with patients for Virtual Visits (Telemedicine).  Patients are able to view lab/test results, encounter notes, upcoming appointments, etc.  Non-urgent messages can be sent to your provider as well.   To learn more about what you can do with MyChart, go to [email or web address].    Your next appointment:   6 month(s)  Provider:   Mark Skains, MD     

## 2022-10-22 ENCOUNTER — Other Ambulatory Visit: Payer: Self-pay | Admitting: Family Medicine

## 2022-10-22 DIAGNOSIS — K7689 Other specified diseases of liver: Secondary | ICD-10-CM

## 2022-10-23 ENCOUNTER — Encounter: Payer: Self-pay | Admitting: Pulmonary Disease

## 2022-10-23 ENCOUNTER — Ambulatory Visit (INDEPENDENT_AMBULATORY_CARE_PROVIDER_SITE_OTHER): Payer: Medicare Other | Admitting: Pulmonary Disease

## 2022-10-23 VITALS — BP 122/60 | HR 61 | Ht 62.0 in | Wt 184.2 lb

## 2022-10-23 DIAGNOSIS — R0602 Shortness of breath: Secondary | ICD-10-CM | POA: Diagnosis not present

## 2022-10-23 NOTE — Patient Instructions (Signed)
I will see you in about 6 weeks  We can get the breathing study done on the day you are coming in so that you are not making multiple trips  Continue Breo -Be on the look out for will you said you experience recently which was the rapid heartbeat If this were to continue May be related to the use of Breo In that situation we have to consider changing you to a different inhaler  Continue with regular activities as tolerated  Call us with significant concerns  At present continue [facility]

## 2022-10-23 NOTE — Progress Notes (Signed)
Colleen Cole    [id number]    January 13, 1946  Primary Care Physician:Hagler, Fleet Contras, MD  Referring Physician: Aliene Beams, MD [address] [place],  [place] [address]  Chief complaint:   Patient being seen for shortness of breath  HPI:  History of shortness of breath with activity  Usually gets short of breath whenever she is showing horses  Able to get on the treadmill and walk at a moderate pace but with increased incline will have some difficulty  Was recently seen by her primary doctor who prescribed Robbie Louis has been working very well for her Improvement in symptoms  Reformed smoker quit in 2000, was smoking about 1 pack of cigarettes a day Was not labeled as having obstructive lung disease previously  She does have a history of hypertension, history of asthma, history of allergies,  She tries to stay active  Denies any significant chest pains or chest discomfort  Does have a history of atrial fibrillation for which she had a watchman's procedure  Outpatient Encounter Medications as of 10/23/2022  Medication Sig   acetaminophen (TYLENOL) 500 MG tablet Take 1,000 mg by mouth every 6 (six) hours as needed for moderate pain.   alendronate (FOSAMAX) 70 MG tablet Take 70 mg by mouth every Sunday.   amoxicillin (AMOXIL) 500 MG tablet Take 4 tablets (2,000 mg total) by mouth as directed. 1 HOUR PRIOR TO DENTAL APPOINTMENTS   aspirin EC 81 MG tablet Take 1 tablet (81 mg total) by mouth daily. Swallow whole. START ON 3/25   BREO ELLIPTA 100-25 MCG/ACT AEPB 1 puff every morning.   Calcium Carb-Cholecalciferol (CALCIUM 600 + D PO) Take 1 tablet by mouth 2 (two) times daily.   Carboxymethylcellul-Glycerin (LUBRICATING EYE DROPS OP) Place 1 drop into both eyes daily as needed (dry eyes).   clopidogrel (PLAVIX) 75 MG tablet Take 1 tablet (75 mg total) by mouth daily. START ON 3/25   Docusate Calcium (STOOL SOFTENER PO) Take 2 capsules by mouth every evening.    escitalopram (LEXAPRO) 5 MG tablet Take 5 mg by mouth daily.   fluticasone (FLONASE) 50 MCG/ACT nasal spray Place 1 spray into both nostrils daily as needed for allergies or rhinitis.   hydrALAZINE (APRESOLINE) 25 MG tablet Take 1 tablet (25 mg total) by mouth 2 (two) times daily. (Patient taking differently: Take 25-50 mg by mouth See admin instructions. 2 tabs in the am, 1 at noon, 2 tabs at pm)   hydrochlorothiazide (HYDRODIURIL) 25 MG tablet Take 25 mg by mouth daily.   levocetirizine (XYZAL) 5 MG tablet Take 5 mg by mouth every evening.   losartan (COZAAR) 100 MG tablet Take 100 mg by mouth every evening.   metoprolol tartrate (LOPRESSOR) 50 MG tablet Take 1 tablet (50 mg total) by mouth 2 (two) times daily.   nitroGLYCERIN (NITROSTAT) 0.4 MG SL tablet Place 1 tablet (0.4 mg total) under the tongue every 5 (five) minutes as needed.   rosuvastatin (CRESTOR) 20 MG tablet Take 1 tablet (20 mg total) by mouth every evening.   senna (SENOKOT) 8.6 MG TABS tablet Take 2 tablets by mouth every evening.   VITAMIN D PO Take 1 capsule by mouth daily.   No facility-administered encounter medications on file as of 10/23/2022.    Allergies as of 10/23/2022   (No Known Allergies)    Past Medical History:  Diagnosis Date   Carotid disease, bilateral (HCC)    Coronary artery disease  Hyperlipidemia    Hypertension    Paroxysmal A-fib (HCC)    Presence of Watchman left atrial appendage closure device 07/11/2022   27mm Watchman implant with Dr. Excell Seltzer    Past Surgical History:  Procedure Laterality Date   ATRIAL FIBRILLATION ABLATION N/A 12/26/2021   Procedure: ATRIAL FIBRILLATION ABLATION;  Surgeon: Regan Lemming, MD;  Location: [facility] INVASIVE CV LAB;  Service: Cardiovascular;  Laterality: N/A;   BREAST EXCISIONAL BIOPSY Right 1995   NEG   CORONARY PRESSURE/FFR STUDY N/A 08/27/2021   Procedure: INTRAVASCULAR PRESSURE WIRE/FFR STUDY;  Surgeon: Tonny Bollman, MD;  Location: [facility] INVASIVE CV  LAB;  Service: Cardiovascular;  Laterality: N/A;   CORONARY STENT INTERVENTION N/A 08/27/2021   Procedure: CORONARY STENT INTERVENTION;  Surgeon: Tonny Bollman, MD;  Location: [facility] INVASIVE CV LAB;  Service: Cardiovascular;  Laterality: N/A;   FRACTURE SURGERY  2007   fracture in left leg-at patella, tibia and fibula   LEFT ATRIAL APPENDAGE OCCLUSION N/A 07/11/2022   Procedure: LEFT ATRIAL APPENDAGE OCCLUSION;  Surgeon: Tonny Bollman, MD;  Location: [facility] INVASIVE CV LAB;  Service: Cardiovascular;  Laterality: N/A;   LEFT HEART CATH AND CORONARY ANGIOGRAPHY N/A 08/27/2021   Procedure: LEFT HEART CATH AND CORONARY ANGIOGRAPHY;  Surgeon: Tonny Bollman, MD;  Location: [facility] INVASIVE CV LAB;  Service: Cardiovascular;  Laterality: N/A;   TEE WITHOUT CARDIOVERSION N/A 07/11/2022   Procedure: TRANSESOPHAGEAL ECHOCARDIOGRAM;  Surgeon: Tonny Bollman, MD;  Location: [facility] INVASIVE CV LAB;  Service: Cardiovascular;  Laterality: N/A;   TOTAL HIP ARTHROPLASTY Right 08/04/2018   Procedure: TOTAL HIP ARTHROPLASTY ANTERIOR APPROACH;  Surgeon: Marcene Corning, MD;  Location: [facility] ORS;  Service: Orthopedics;  Laterality: Right;    Family History  Problem Relation Age of Onset   Breast cancer Neg Hx     Social History   Socioeconomic History   Marital status: Single    Spouse name: Not on file   Number of children: Not on file   Years of education: Not on file   Highest education level: Not on file  Occupational History   Not on file  Tobacco Use   Smoking status: Former    Types: Cigarettes    Quit date: 07/13/1998    Years since quitting: 24.2   Smokeless tobacco: Never   Tobacco comments:    Former smoker 01/28/22  Vaping Use   Vaping Use: Never used  Substance and Sexual Activity   Alcohol use: Yes    Alcohol/week: 2.0 standard drinks of alcohol    Types: 2 Standard drinks or equivalent per week    Comment: 2 glasses of wine weekly 01/28/22   Drug use: Never   Sexual activity: Not Currently  Other  Topics Concern   Not on file  Social History Narrative   Not on file   Social Determinants of Health   Financial Resource Strain: Not on file  Food Insecurity: Not on file  Transportation Needs: Not on file  Physical Activity: Not on file  Stress: Not on file  Social Connections: Not on file  Intimate Partner Violence: Not on file    Review of Systems  Respiratory:  Positive for shortness of breath.     Vitals:   10/23/22 0926  BP: 122/60  Pulse: 61  SpO2: 96%     Physical Exam Constitutional:      Appearance: She is obese.  HENT:     Head: Normocephalic.     Nose: Nose normal.     Mouth/Throat:  Mouth: Mucous membranes are moist.  Eyes:     General: No scleral icterus.    Pupils: Pupils are equal, round, and reactive to light.  Cardiovascular:     Rate and Rhythm: Normal rate and regular rhythm.     Heart sounds: No murmur heard.    No friction rub.  Pulmonary:     Effort: No respiratory distress.     Breath sounds: No stridor. No wheezing or rhonchi.  Musculoskeletal:     Cervical back: No rigidity or tenderness.  Neurological:     Mental Status: She is alert.  Psychiatric:        Mood and Affect: Mood normal.      Data Reviewed: Patient had a recent cardiac CT which was reviewed by myself, does have evidence of bronchiectasis at the bases  Assessment:  Patient with a history of shortness of breath on exertion with past history of smoking Does not have underlying obstructive lung disease known to her -Has had improvement of symptoms with use of Breo  History of obstructive sleep apnea -On CPAP therapy  History of paroxysmal atrial fibrillation -History of watchman's procedure  Did have an episode of rapid heartbeats, did not go into A-fib according to her according to monitoring from her watch-we did discuss if this may be related to use of Breo  Plan/Recommendations: Schedule for pulmonary function test  Continue Breo-has used this for  about 2 weeks now and does notice improvement in symptoms  Will have to consider another agent if Breo contributes to tachycardia  Encouraged to continue graded activities as tolerated  Encouraged to call with significant concerns  Tentative follow-up in about 6 weeks   Virl Diamond MD [facility] 10/23/2022, 9:36 AM  CC: Aliene Beams, MD

## 2022-11-07 ENCOUNTER — Telehealth: Payer: Self-pay | Admitting: Cardiology

## 2022-11-07 NOTE — Telephone Encounter (Signed)
Phone call. Apple watch AFIB. Sent strips from watch AFIB 120-130 noted.  Took metoprolol 50mg .  About ten min later, new strip sent sinus rhythm. Felt better.  Drank Gatorade/ hydrated. She felt AFIB when present.   BP elevated at time. Had stopped amlodipine 5mg  previously. Has with her just in case needs later.   Post ablation last year. Watchman as well.   Will monitor.   Donato Schultz, MD

## 2022-11-18 ENCOUNTER — Telehealth: Payer: Self-pay | Admitting: Pharmacist

## 2022-11-18 NOTE — Telephone Encounter (Addendum)
Pt with Medicare Part D, does not have DM (A1c 5.6% at [facility] 07/2022). Will try submitting for [facility] under newer CV indication. Key: [id number].

## 2022-11-18 NOTE — Telephone Encounter (Signed)
-----   Message from Jake Bathe, MD sent at 11/18/2022 12:24 PM EDT ----- Regarding: Question GLP-1 prior authorization Colleen Cole,  I was wondering if you could check out [name]'s eligibility/prior authorization for GLP-1 receptor agonist.  Has known coronary artery disease, LAD stent.  She has been battling weight loss.  She is interested and asked me about this.  Thanks -[employer]

## 2022-11-19 ENCOUNTER — Other Ambulatory Visit (HOSPITAL_COMMUNITY): Payer: Self-pay

## 2022-11-19 MED ORDER — WEGOVY 0.25 MG/0.5ML ~~LOC~~ SOAJ: 0.2500 mg | SUBCUTANEOUS | 0 refills | Status: DC

## 2022-11-19 NOTE — Telephone Encounter (Signed)
Patient called stating that her copay for [facility] is $500. She said that it is doable for her. She was scheduled for apt with PharmD tomorrow at 10:30. She was advised to put medication in fridge but bring 1 pen so we can show her how to use it.

## 2022-11-19 NOTE — Telephone Encounter (Signed)
Prior auth approved through 11/18/23. Tech to run test claim to check copay amount which came back around $500. Spoke with pt, rx sent to pharmacy to confirm specific copay. She will give [place] a call back and let us know if it's affordable. If so, will schedule appt with PharmD in office. Have not reviewed med specifics yet with pt as wanted to see if it was affordable first.

## 2022-11-20 ENCOUNTER — Ambulatory Visit
Admission: RE | Admit: 2022-11-20 | Discharge: 2022-11-20 | Disposition: A | Discharge status: Home / Self Care | Payer: Medicare Other | Source: Ambulatory Visit | Attending: Family Medicine | Admitting: Family Medicine

## 2022-11-20 ENCOUNTER — Ambulatory Visit: Payer: Medicare Other | Attending: Internal Medicine | Admitting: Pharmacist

## 2022-11-20 VITALS — Wt 186.0 lb

## 2022-11-20 DIAGNOSIS — I25119 Atherosclerotic heart disease of native coronary artery with unspecified angina pectoris: Secondary | ICD-10-CM

## 2022-11-20 DIAGNOSIS — K7689 Other specified diseases of liver: Secondary | ICD-10-CM

## 2022-11-20 DIAGNOSIS — E669 Obesity, unspecified: Secondary | ICD-10-CM | POA: Insufficient documentation

## 2022-11-20 DIAGNOSIS — E66811 Obesity, class 1: Secondary | ICD-10-CM | POA: Insufficient documentation

## 2022-11-20 NOTE — Progress Notes (Signed)
Patient ID: Colleen Cole                 DOB: 04/17/1946                    MRN: [id number]     HPI: Colleen Cole is a 77 y.o. female patient referred to pharmacy clinic by Dr Anne Fu to initiate weight loss therapy with GLP1-RA. PMH is significant for afib s/p ablation and Watchman device, CAD s/p stent to LAD in 08/2021, OSA on CPAP, and obesity. A1c normal at 5.6% at PCP in 07/2022. Most recent BMI 34.  [place] prior auth approved under CV risk reduction indication through 11/18/23 with monthly copay of $500 which pt is ok with. Also follows at [facility]. Has previously done Weight Watchers and lost 40 lbs. Eats a healthy diet when she's home, but is on the road traveling a lot showing horses and her diet isn't as great then.  Diet:  Breakfast - Atkins protein shake Lunch - salad with [place] and low fat dressing Dinner - chicken thigh air fried, green beans Snack - cottage cheese and watermelon Drinks - water, coffee, glass of wine a few nights a week, no sugary drinks  Exercise: Horseback riding 2-3 days a week, golfs, used to go to the gym before afib was acting up.  Family History: Non-contributory  Social History: Former tobacco use, quit in 2000, occasional wine, no drug use.  Wt Readings from Last 1 Encounters:  10/23/22 184 lb 3.2 oz (83.6 kg)    BP Readings from Last 1 Encounters:  10/23/22 122/60   Pulse Readings from Last 1 Encounters:  10/23/22 61       Component Value Date/Time   CHOL 151 06/12/2021 0814   TRIG 63 06/12/2021 0814   HDL 81 06/12/2021 0814   CHOLHDL 1.9 06/12/2021 0814   LDLCALC 57 06/12/2021 0814    Past Medical History:  Diagnosis Date   Carotid disease, bilateral (HCC)    Coronary artery disease    Hyperlipidemia    Hypertension    Paroxysmal A-fib (HCC)    Presence of Watchman left atrial appendage closure device 07/11/2022   27mm Watchman implant with Dr. Excell Seltzer    Current Outpatient Medications on File Prior to Visit   Medication Sig Dispense Refill   acetaminophen (TYLENOL) 500 MG tablet Take 1,000 mg by mouth every 6 (six) hours as needed for moderate pain.     alendronate (FOSAMAX) 70 MG tablet Take 70 mg by mouth every Sunday.     amoxicillin (AMOXIL) 500 MG tablet Take 4 tablets (2,000 mg total) by mouth as directed. 1 HOUR PRIOR TO DENTAL APPOINTMENTS 12 tablet 6   aspirin EC 81 MG tablet Take 1 tablet (81 mg total) by mouth daily. Swallow whole. START ON 3/25 90 tablet 3   BREO ELLIPTA 100-25 MCG/ACT AEPB 1 puff every morning.     Calcium Carb-Cholecalciferol (CALCIUM 600 + D PO) Take 1 tablet by mouth 2 (two) times daily.     Carboxymethylcellul-Glycerin (LUBRICATING EYE DROPS OP) Place 1 drop into both eyes daily as needed (dry eyes).     clopidogrel (PLAVIX) 75 MG tablet Take 1 tablet (75 mg total) by mouth daily. START ON 3/25 90 tablet 3   Docusate Calcium (STOOL SOFTENER PO) Take 2 capsules by mouth every evening.     escitalopram (LEXAPRO) 5 MG tablet Take 5 mg by mouth daily.     fluticasone (FLONASE) 50 MCG/ACT  nasal spray Place 1 spray into both nostrils daily as needed for allergies or rhinitis.     hydrALAZINE (APRESOLINE) 25 MG tablet Take 1 tablet (25 mg total) by mouth 2 (two) times daily. (Patient taking differently: Take 25-50 mg by mouth See admin instructions. 2 tabs in the am, 1 at noon, 2 tabs at pm) 180 tablet 3   hydrochlorothiazide (HYDRODIURIL) 25 MG tablet Take 25 mg by mouth daily.     levocetirizine (XYZAL) 5 MG tablet Take 5 mg by mouth every evening.     losartan (COZAAR) 100 MG tablet Take 100 mg by mouth every evening.     metoprolol tartrate (LOPRESSOR) 50 MG tablet Take 1 tablet (50 mg total) by mouth 2 (two) times daily. 180 tablet 3   nitroGLYCERIN (NITROSTAT) 0.4 MG SL tablet Place 1 tablet (0.4 mg total) under the tongue every 5 (five) minutes as needed. 25 tablet 2   rosuvastatin (CRESTOR) 20 MG tablet Take 1 tablet (20 mg total) by mouth every evening. 90 tablet 3    senna (SENOKOT) 8.6 MG TABS tablet Take 2 tablets by mouth every evening.     VITAMIN D PO Take 1 capsule by mouth daily.     WEGOVY 0.25 MG/0.5ML SOAJ Inject 0.25 mg into the skin once a week. 2 mL 0   No current facility-administered medications on file prior to visit.    No Known Allergies   Assessment/Plan:  1. Weight loss and cardiovascular risk reduction - Patient has not met goal of at least 5% of body weight loss with comprehensive lifestyle modifications alone in the past 3-6 months. Pharmacotherapy is appropriate to pursue as augmentation. Will start Wegovy 0.25mg  SQ weekly. Discussed anticipated weight loss, dose titration schedule, CV benefit, and potential GI side effects with pt. Will call her monthly for future dose titrations.   Atharv Barriere E. Rehana Uncapher, PharmD, BCACP, CPP [facility] [address], [place], [place] [address] Phone: [phone]; Fax: [phone] 11/20/2022 11:06 AM

## 2022-11-20 NOTE — Patient Instructions (Addendum)
[facility] Counseling Points This medication reduces your appetite and may make you feel fuller longer.  Stop eating when your body tells you that you are full. This will likely happen sooner than you are used to. Fried/greasy food and sweets may upset your stomach - minimize these as much as possible. Store your medication in the fridge until you are ready to use it. Inject your medication in the fatty tissue of your lower abdominal area (2 inches away from belly button) or upper outer thigh. Rotate injection sites. Common side effects include: nausea, diarrhea/constipation, and heartburn, and are more likely to occur if you overeat. Stop your injection for 7 days prior to surgical procedures requiring anesthesia.  Dosing schedule: - Month 1: Inject 0.25mg  subcutaneously once weekly for 4 weeks - Month 2: Inject 0.5mg  subcutaneously once weekly for 4 weeks - Month 3: Inject 1mg  subcutaneously once weekly for 4 weeks - Month 4: Inject 1.7mg  subcutaneously once weekly for 4 weeks - Month 5: Inject 2.4mg  subcutaneously once weekly  I'll call you monthly for dose titrations. Give me a call with any concerns at #[phone]  Tips for success: Write down the reasons why you want to lose weight and post it in a place where you'll see it often.  Start small and work your way up. Keep in mind that it takes time to achieve goals, and small steps add up.  Any additional movements help to burn calories. Taking the stairs rather than the elevator and parking at the far end of your parking lot are easy ways to start. Brisk walking for at least 30 minutes 4 or more days of the week is an excellent goal to work toward  Understanding what it means to feel full: Did you know that it can take 15 minutes or more for your brain to receive the message that you've eaten? That means that, if you eat less food, but consume it slower, you may still feel satisfied.  Eating a lot of fruits and vegetables can also help  you feel fuller.  Eat off of smaller plates so that moderate portions don't seem too small  Tips for living a healthier life     Building a Healthy and Balanced Diet Make most of your meal vegetables and fruits -  of your plate. Aim for color and variety, and remember that potatoes don't count as vegetables on the Healthy Eating Plate because of their negative impact on blood sugar.  Go for whole grains -  of your plate. Whole and intact grains--whole wheat, barley, wheat berries, quinoa, oats, brown rice, and foods made with them, such as whole wheat pasta--have a milder effect on blood sugar and insulin than white bread, white rice, and other refined grains.  Protein power -  of your plate. Fish, poultry, beans, and nuts are all healthy, versatile protein sources--they can be mixed into salads, and pair well with vegetables on a plate. Limit red meat, and avoid processed meats such as bacon and sausage.  Healthy plant oils - in moderation. Choose healthy vegetable oils like olive, canola, soy, corn, sunflower, peanut, and others, and avoid partially hydrogenated oils, which contain unhealthy trans fats. Remember that low-fat does not mean "healthy."  Drink water, coffee, or tea. Skip sugary drinks, limit milk and dairy products to one to two servings per day, and limit juice to a small glass per day.  Stay active. The red figure running across the Healthy Eating Plate's placemat is a reminder that staying active  is also important in weight control.  The main message of the Healthy Eating Plate is to focus on diet quality:  The type of carbohydrate in the diet is more important than the amount of carbohydrate in the diet, because some sources of carbohydrate--like vegetables (other than potatoes), fruits, whole grains, and beans--are healthier than others. The Healthy Eating Plate also advises consumers to avoid sugary beverages, a major source of calories--usually with little  nutritional value--in the American diet. The Healthy Eating Plate encourages consumers to use healthy oils, and it does not set a maximum on the percentage of calories people should get each day from healthy sources of fat. In this way, the Healthy Eating Plate recommends the opposite of the low-fat message promoted for decades by the USDA.  [employer]  SUGAR  Sugar is a huge problem in the modern day diet. Sugar is a big contributor to heart disease, diabetes, high triglyceride levels, fatty liver disease and obesity. Sugar is hidden in almost all packaged foods/beverages. Added sugar is extra sugar that is added beyond what is naturally found and has no nutritional benefit for your body. The American Heart Association recommends limiting added sugars to no more than 25g for women and 36 grams for men per day. There are many names for sugar including maltose, sucrose (names ending in "ose"), high fructose corn syrup, molasses, cane sugar, corn sweetener, raw sugar, syrup, honey or fruit juice concentrate.   One of the best ways to limit your added sugars is to stop drinking sweetened beverages such as soda, sweet tea, and fruit juice.  There is 65g of added sugars in one 20oz bottle of Coke! That is equal to 7.5 donuts.   Pay attention and read all nutrition facts labels. Below is an examples of a nutrition facts label. The #1 is showing you the total sugars where the # 2 is showing you the added sugars. This one serving has almost the max amount of added sugars per day!   EXERCISE  Exercise is good. We've all heard that. In an ideal world, we would all have time and resources to get plenty of it. When you are active, your heart pumps more efficiently and you will feel better.  Multiple studies show that even walking regularly has benefits that include living a longer life. The American Heart Association recommends 150 minutes per week of  exercise (30 minutes per day most days of the week). You can do this in any increment you wish. Nine or more 10-minute walks count. So does an hour-long exercise class. Break the time apart into what will work in your life. Some of the best things you can do include walking briskly, jogging, cycling or swimming laps. Not everyone is ready to "exercise." Sometimes we need to start with just getting active. Here are some easy ways to be more active throughout the day:  Take the stairs instead of the elevator  Go for a 10-15 minute walk during your lunch break (find a friend to make it more enjoyable)  When shopping, park at the back of the parking lot  If you take public transportation, get off one stop early and walk the extra distance  Pace around while making phone calls  Check with your doctor if you aren't sure what your limitations may be. Always remember to drink plenty of water when doing any type of exercise. Don't feel like a failure if you're not getting the 90-150 minutes per week. If you started by  being a couch potato, then just a 10-minute walk each day is a huge improvement. Start with little victories and work your way up.   HEALTHY EATING TIPS              Plan ahead: make a menu of the meals for a week then create a grocery list to go with that menu. Consider meals that easily stretch into a night of leftovers, such as stews or casseroles. Or consider making two of your favorite meal and put one in the freezer for another night. Try a night or two each week that is "meatless" or "no cook" such as salads. When you get home from the grocery store wash and prepare your vegetables and fruits. Then when you need them they are ready to go.   Tips for going to the grocery store:  Buy store or generic brands  Check the weekly ad from your store on-line or in their in-store flyer  Look at the unit price on the shelf tag to compare/contrast the costs of different items  Buy fruits/vegetables  in season  Carrots, bananas and apples are low-cost, naturally healthy items  If meats or frozen vegetables are on sale, buy some extras and put in your freezer  Limit buying prepared or "ready to eat" items, even if they are pre-made salads or fruit snacks  Do not shop when you're hungry  Foods at eye level tend to be more expensive. Look on the high and low shelves for deals.  Consider shopping at the farmer's market for fresh foods in season.  Avoid the cookie and chip aisles (these are expensive, high in calories and low in nutritional value). Shop on the outside of the grocery store.  Healthy food preparations:  If you can't get lean hamburger, be sure to drain the fat when cooking  Steam, saut (in olive oil), grill or bake foods  Experiment with different seasonings to avoid adding salt to your foods. Kosher salt, sea salt and Himalayan salt are all still salt and should be avoided. Try seasoning food with onion, garlic, thyme, rosemary, basil ect. Onion powder or garlic powder is ok. Avoid if it says salt (ie garlic salt).

## 2022-12-11 ENCOUNTER — Telehealth: Payer: Self-pay | Admitting: Pharmacist

## 2022-12-11 MED ORDER — WEGOVY 0.5 MG/0.5ML ~~LOC~~ SOAJ: 0.5000 mg | SUBCUTANEOUS | 0 refills | Status: DC

## 2022-12-11 NOTE — Telephone Encounter (Signed)
Called pt to follow up with Colleen Cole tolerability. Giving 4th dose of 0.25mg  today. No GI upset, tolerating well. Down 5-6 lbs so far. Still getting hungry but not wanting as much when she does eat, feels full sooner. Would like to increase her dose for next month, rx sent in.

## 2022-12-16 ENCOUNTER — Telehealth: Payer: Self-pay | Admitting: Pharmacist

## 2022-12-16 NOTE — Telephone Encounter (Signed)
Pt called clinic, reports sore throat, runny nose, and cough. Wants to make sure it's not her Wegovy. Mentions she was started on Breo inhaler about 6 weeks ago as well - all of these side effects are listed with Breo. Discussed inhaler more likely to be the cause of her symptoms, or may have a cold. No signs of allergic reaction to [facility]. She will f/u with PCP who prescribed inhaler.

## 2023-01-08 ENCOUNTER — Telehealth: Payer: Self-pay | Admitting: Pharmacist

## 2023-01-08 MED ORDER — WEGOVY 1 MG/0.5ML ~~LOC~~ SOAJ: 1.0000 mg | SUBCUTANEOUS | 0 refills | Status: DC

## 2023-01-08 NOTE — Telephone Encounter (Signed)
Called pt, she's lost about 5 lbs so far in 6 weeks on Wegovy. Tolerating med well, thinks she eats well but portions are still too big. Will continue with dose titration which should help decrease appetite. Rx sent in for 1mg  dose for next month.

## 2023-01-10 ENCOUNTER — Ambulatory Visit (INDEPENDENT_AMBULATORY_CARE_PROVIDER_SITE_OTHER): Payer: Medicare Other | Admitting: Pulmonary Disease

## 2023-01-10 ENCOUNTER — Ambulatory Visit: Payer: Medicare Other | Admitting: Pulmonary Disease

## 2023-01-10 ENCOUNTER — Encounter: Payer: Self-pay | Admitting: Pulmonary Disease

## 2023-01-10 VITALS — BP 100/60 | HR 73 | Ht 62.0 in | Wt 183.6 lb

## 2023-01-10 DIAGNOSIS — R0602 Shortness of breath: Secondary | ICD-10-CM | POA: Diagnosis not present

## 2023-01-10 LAB — PULMONARY FUNCTION TEST
DL/VA % pred: 95 %
DL/VA: 3.97 ml/min/mmHg/L
DLCO cor % pred: 98 %
DLCO cor: 17.6 ml/min/mmHg
DLCO unc % pred: 98 %
DLCO unc: 17.6 ml/min/mmHg
FEF 25-75 Post: 1.95 L/sec
FEF 25-75 Pre: 1.54 L/sec
FEF2575-%Change-Post: 26 %
FEF2575-%Pred-Post: 125 %
FEF2575-%Pred-Pre: 99 %
FEV1-%Change-Post: 10 %
FEV1-%Pred-Post: 107 %
FEV1-%Pred-Pre: 97 %
FEV1-Post: 2.06 L
FEV1-Pre: 1.87 L
FEV1FVC-%Change-Post: 3 %
FEV1FVC-%Pred-Pre: 99 %
FEV6-%Change-Post: 6 %
FEV6-%Pred-Post: 109 %
FEV6-%Pred-Pre: 102 %
FEV6-Post: 2.66 L
FEV6-Pre: 2.5 L
FEV6FVC-%Change-Post: 0 %
FEV6FVC-%Pred-Post: 104 %
FEV6FVC-%Pred-Pre: 104 %
FVC-%Change-Post: 6 %
FVC-%Pred-Post: 104 %
FVC-%Pred-Pre: 97 %
FVC-Post: 2.68 L
FVC-Pre: 2.51 L
Post FEV1/FVC ratio: 77 %
Post FEV6/FVC ratio: 100 %
Pre FEV1/FVC ratio: 74 %
Pre FEV6/FVC Ratio: 100 %
RV % pred: 103 %
RV: 2.26 L
TLC % pred: 104 %
TLC: 4.96 L

## 2023-01-10 NOTE — Progress Notes (Signed)
Colleen Cole    [id number]    1945-08-21  Primary Care Physician:Hagler, Fleet Contras, MD  Referring Physician: Aliene Beams, MD [phone] [address] [place],  [place] [address]  Chief complaint:   Patient being seen for shortness of breath  HPI:  History of shortness of breath with activity  Continues to try to stay active  She was prescribed Advair, was having problems with Advair with a cough, switched to Symbicort and has been doing better with Symbicort  Prior to Advair she did use Breo  Reformed smoker quit in 2000, was smoking about 1 pack of cigarettes a day Was not labeled as having obstructive lung disease previously  She does have a history of hypertension, history of asthma, history of allergies,  She tries to stay active  Denies any significant chest pains or chest discomfort  Does have a history of atrial fibrillation for which she had a watchman's procedure  Outpatient Encounter Medications as of 01/10/2023  Medication Sig   acetaminophen (TYLENOL) 500 MG tablet Take 1,000 mg by mouth every 6 (six) hours as needed for moderate pain.   alendronate (FOSAMAX) 70 MG tablet Take 70 mg by mouth every Sunday.   amoxicillin (AMOXIL) 500 MG tablet Take 4 tablets (2,000 mg total) by mouth as directed. 1 HOUR PRIOR TO DENTAL APPOINTMENTS   aspirin EC 81 MG tablet Take 1 tablet (81 mg total) by mouth daily. Swallow whole. START ON 3/25   BREO ELLIPTA 100-25 MCG/ACT AEPB 1 puff every morning.   Calcium Carb-Cholecalciferol (CALCIUM 600 + D PO) Take 1 tablet by mouth 2 (two) times daily.   Carboxymethylcellul-Glycerin (LUBRICATING EYE DROPS OP) Place 1 drop into both eyes daily as needed (dry eyes).   clopidogrel (PLAVIX) 75 MG tablet Take 1 tablet (75 mg total) by mouth daily. START ON 3/25   Docusate Calcium (STOOL SOFTENER PO) Take 2 capsules by mouth every evening.   escitalopram (LEXAPRO) 5 MG tablet Take 5 mg by mouth daily.   fluticasone  (FLONASE) 50 MCG/ACT nasal spray Place 1 spray into both nostrils daily as needed for allergies or rhinitis.   hydrALAZINE (APRESOLINE) 25 MG tablet Take 1 tablet (25 mg total) by mouth 2 (two) times daily. (Patient taking differently: Take 25-50 mg by mouth See admin instructions. 2 tabs in the am, 1 at noon, 2 tabs at pm)   hydrochlorothiazide (HYDRODIURIL) 25 MG tablet Take 25 mg by mouth daily.   levocetirizine (XYZAL) 5 MG tablet Take 5 mg by mouth every evening.   losartan (COZAAR) 100 MG tablet Take 100 mg by mouth every evening.   metoprolol tartrate (LOPRESSOR) 50 MG tablet Take 1 tablet (50 mg total) by mouth 2 (two) times daily. (Patient taking differently: Take 75 mg by mouth 2 (two) times daily.)   nitroGLYCERIN (NITROSTAT) 0.4 MG SL tablet Place 1 tablet (0.4 mg total) under the tongue every 5 (five) minutes as needed.   rosuvastatin (CRESTOR) 20 MG tablet Take 1 tablet (20 mg total) by mouth every evening.   senna (SENOKOT) 8.6 MG TABS tablet Take 2 tablets by mouth every evening.   VITAMIN D PO Take 1 capsule by mouth daily.   WEGOVY 1 MG/0.5ML SOAJ Inject 1 mg into the skin once a week.   No facility-administered encounter medications on file as of 01/10/2023.    Allergies as of 01/10/2023   (No Known Allergies)    Past Medical History:  Diagnosis Date  Carotid disease, bilateral (HCC)    Coronary artery disease    Hyperlipidemia    Hypertension    Paroxysmal A-fib (HCC)    Presence of Watchman left atrial appendage closure device 07/11/2022   27mm Watchman implant with Dr. Excell Seltzer    Past Surgical History:  Procedure Laterality Date   ATRIAL FIBRILLATION ABLATION N/A 12/26/2021   Procedure: ATRIAL FIBRILLATION ABLATION;  Surgeon: Regan Lemming, MD;  Location: [facility] INVASIVE CV LAB;  Service: Cardiovascular;  Laterality: N/A;   BREAST EXCISIONAL BIOPSY Right 1995   NEG   CORONARY PRESSURE/FFR STUDY N/A 08/27/2021   Procedure: INTRAVASCULAR PRESSURE WIRE/FFR  STUDY;  Surgeon: Tonny Bollman, MD;  Location: [facility] INVASIVE CV LAB;  Service: Cardiovascular;  Laterality: N/A;   CORONARY STENT INTERVENTION N/A 08/27/2021   Procedure: CORONARY STENT INTERVENTION;  Surgeon: Tonny Bollman, MD;  Location: [facility] INVASIVE CV LAB;  Service: Cardiovascular;  Laterality: N/A;   FRACTURE SURGERY  2007   fracture in left leg-at patella, tibia and fibula   LEFT ATRIAL APPENDAGE OCCLUSION N/A 07/11/2022   Procedure: LEFT ATRIAL APPENDAGE OCCLUSION;  Surgeon: Tonny Bollman, MD;  Location: [facility] INVASIVE CV LAB;  Service: Cardiovascular;  Laterality: N/A;   LEFT HEART CATH AND CORONARY ANGIOGRAPHY N/A 08/27/2021   Procedure: LEFT HEART CATH AND CORONARY ANGIOGRAPHY;  Surgeon: Tonny Bollman, MD;  Location: [facility] INVASIVE CV LAB;  Service: Cardiovascular;  Laterality: N/A;   TEE WITHOUT CARDIOVERSION N/A 07/11/2022   Procedure: TRANSESOPHAGEAL ECHOCARDIOGRAM;  Surgeon: Tonny Bollman, MD;  Location: [facility] INVASIVE CV LAB;  Service: Cardiovascular;  Laterality: N/A;   TOTAL HIP ARTHROPLASTY Right 08/04/2018   Procedure: TOTAL HIP ARTHROPLASTY ANTERIOR APPROACH;  Surgeon: Marcene Corning, MD;  Location: [facility] ORS;  Service: Orthopedics;  Laterality: Right;    Family History  Problem Relation Age of Onset   Breast cancer Neg Hx     Social History   Socioeconomic History   Marital status: Single    Spouse name: Not on file   Number of children: Not on file   Years of education: Not on file   Highest education level: Not on file  Occupational History   Not on file  Tobacco Use   Smoking status: Former    Current packs/day: 0.00    Types: Cigarettes    Quit date: 07/13/1998    Years since quitting: 24.5   Smokeless tobacco: Never   Tobacco comments:    Former smoker 01/28/22  Vaping Use   Vaping status: Never Used  Substance and Sexual Activity   Alcohol use: Yes    Alcohol/week: 2.0 standard drinks of alcohol    Types: 2 Standard drinks or equivalent per week    Comment:  2 glasses of wine weekly 01/28/22   Drug use: Never   Sexual activity: Not Currently  Other Topics Concern   Not on file  Social History Narrative   Not on file   Social Determinants of Health   Financial Resource Strain: Not on file  Food Insecurity: Not on file  Transportation Needs: Not on file  Physical Activity: Not on file  Stress: Not on file  Social Connections: Unknown (10/16/2021)   Received from [facility], [facility]   Social Network    Social Network: Not on file  Intimate Partner Violence: Unknown (09/07/2021)   Received from [facility], [facility]   HITS    Physically Hurt: Not on file    Insult or Talk Down To: Not on file    Threaten  Physical Harm: Not on file    Scream or Curse: Not on file    Review of Systems  Respiratory:  Positive for shortness of breath.     Vitals:   01/10/23 1000  BP: 100/60  Pulse: 73  SpO2: 97%     Physical Exam Constitutional:      Appearance: She is obese.  HENT:     Head: Normocephalic.     Nose: Nose normal.     Mouth/Throat:     Mouth: Mucous membranes are moist.  Eyes:     General: No scleral icterus.    Pupils: Pupils are equal, round, and reactive to light.  Cardiovascular:     Rate and Rhythm: Normal rate and regular rhythm.     Heart sounds: No murmur heard.    No friction rub.  Pulmonary:     Effort: No respiratory distress.     Breath sounds: No stridor. No wheezing or rhonchi.  Musculoskeletal:     Cervical back: No rigidity or tenderness.  Neurological:     Mental Status: She is alert.  Psychiatric:        Mood and Affect: Mood normal.      Data Reviewed: Evidence of bronchiectasis on cardiac CT  Pulmonary function test was reviewed with the patient showing no obstruction, no significant bronchodilator response, no restriction    Assessment:  Patient with a history of shortness of breath on exertion with past history of smoking Does not have underlying obstructive lung  disease known to her -Improvement in symptoms with use of Symbicort  History of obstructive sleep apnea-on CPAP therapy  History of paroxysmal atrial fibrillation -History of watchman's procedure   Plan/Recommendations: Continue Symbicort 160  Continue graded activities as tolerated  Call with significant concerns  Follow-up in 6 months    Virl Diamond MD [facility] 01/10/2023, 10:28 AM  CC: Aliene Beams, MD

## 2023-01-10 NOTE — Patient Instructions (Signed)
Full PFT performed today. °

## 2023-01-10 NOTE — Patient Instructions (Signed)
I will see you back in 6 months  Call with significant concerns  Continue Symbicort  Continue graded activities as tolerated

## 2023-01-10 NOTE — Progress Notes (Signed)
Full PFT performed today. °

## 2023-01-17 ENCOUNTER — Other Ambulatory Visit: Payer: Self-pay | Admitting: Cardiology

## 2023-01-17 NOTE — Progress Notes (Signed)
  [facility]   MULTIDISCIPLINARY HEART TEAM   Patient contacted for 6 month s/p LAAO closure with Watchman. She continues to do very well with no issues. Informed her that she may now stop Plavix 75mg  daily and she no longer requires dental SBE with Amoxicillin. She will remain on lifelong ASA 81mg  daily given hx of CAD. Patient understands and thankful for the call. We will follow with her at the one year anniversary of Watchman placement. Med list has been updated.   Georgie Chard NP-C Structural Heart Team  Pager: [phone] Phone: [phone]

## 2023-02-05 ENCOUNTER — Telehealth: Payer: Self-pay | Admitting: Pharmacist

## 2023-02-05 MED ORDER — WEGOVY 1.7 MG/0.75ML ~~LOC~~ SOAJ: 1.7000 mg | SUBCUTANEOUS | 0 refills | Status: DC

## 2023-02-05 NOTE — Telephone Encounter (Signed)
Called pt to follow up with [facility], she is tolerating med well, will increase dose to 1.7mg  for next month, rx sent in.

## 2023-02-24 ENCOUNTER — Other Ambulatory Visit: Payer: Self-pay | Admitting: Family Medicine

## 2023-02-24 DIAGNOSIS — Z1231 Encounter for screening mammogram for malignant neoplasm of breast: Secondary | ICD-10-CM

## 2023-03-03 ENCOUNTER — Other Ambulatory Visit (HOSPITAL_BASED_OUTPATIENT_CLINIC_OR_DEPARTMENT_OTHER): Payer: Self-pay

## 2023-03-03 MED ORDER — FLUAD 0.5 ML IM SUSY
0.5000 mL | PREFILLED_SYRINGE | Freq: Once | INTRAMUSCULAR | 0 refills | Status: AC
  Filled 2023-03-03: qty 0.5, 1d supply, fill #0

## 2023-03-03 MED ORDER — COMIRNATY 30 MCG/0.3ML IM SUSY
0.3000 mL | PREFILLED_SYRINGE | Freq: Once | INTRAMUSCULAR | 0 refills | Status: AC
  Filled 2023-03-03: qty 0.3, 1d supply, fill #0

## 2023-03-05 ENCOUNTER — Telehealth: Payer: Self-pay | Admitting: Pharmacist

## 2023-03-05 MED ORDER — WEGOVY 2.4 MG/0.75ML ~~LOC~~ SOAJ: 2.4000 mg | SUBCUTANEOUS | 11 refills | Status: DC

## 2023-03-05 NOTE — Telephone Encounter (Signed)
Called pt to follow up with [facility]. Pt reports tolerating well aside from some constipation, senna has been helping. Seeing appetite reduction and further weight loss. Ok with increasing to maintenance 2.4mg  weekly dose. Sees Dr Anne Fu next month, will have office weigh in at that time. PA still good through June 2025.

## 2023-03-06 ENCOUNTER — Other Ambulatory Visit: Payer: Self-pay | Admitting: Cardiology

## 2023-04-03 ENCOUNTER — Ambulatory Visit
Admission: RE | Admit: 2023-04-03 | Discharge: 2023-04-03 | Disposition: A | Discharge status: Home / Self Care | Payer: Medicare Other | Source: Ambulatory Visit | Attending: Family Medicine | Admitting: Family Medicine

## 2023-04-03 DIAGNOSIS — Z1231 Encounter for screening mammogram for malignant neoplasm of breast: Secondary | ICD-10-CM

## 2023-04-07 ENCOUNTER — Other Ambulatory Visit (HOSPITAL_BASED_OUTPATIENT_CLINIC_OR_DEPARTMENT_OTHER): Payer: Self-pay

## 2023-04-07 MED ORDER — RSVPREF3 VAC RECOMB ADJUVANTED 120 MCG/0.5ML IM SUSR
0.5000 mL | Freq: Once | INTRAMUSCULAR | 0 refills | Status: AC
  Filled 2023-04-07: qty 0.5, 1d supply, fill #0

## 2023-04-15 ENCOUNTER — Ambulatory Visit: Payer: Medicare Other | Admitting: Cardiology

## 2023-04-17 ENCOUNTER — Ambulatory Visit: Payer: Medicare Other | Admitting: Cardiology

## 2023-04-22 ENCOUNTER — Ambulatory Visit: Payer: Medicare Other | Attending: Cardiology | Admitting: Cardiology

## 2023-04-22 ENCOUNTER — Encounter: Payer: Self-pay | Admitting: Cardiology

## 2023-04-22 VITALS — BP 120/62 | HR 72 | Ht 62.0 in | Wt 183.2 lb

## 2023-04-22 DIAGNOSIS — I480 Paroxysmal atrial fibrillation: Secondary | ICD-10-CM | POA: Diagnosis not present

## 2023-04-22 DIAGNOSIS — I25119 Atherosclerotic heart disease of native coronary artery with unspecified angina pectoris: Secondary | ICD-10-CM | POA: Diagnosis present

## 2023-04-22 NOTE — Progress Notes (Signed)
Cardiology Office Note:  .   Date:  04/22/2023  ID:  Colleen Cole, DOB 14-Jan-1946, MRN [id number] PCP: Aliene Beams, MD  [facility] Providers Cardiologist:  Donato Schultz, MD Electrophysiologist:  Will Jorja Loa, MD     History of Present Illness: .   Jalynn Dipirro is a 77 y.o. female Discussed the use of AI scribe   History of Present Illness   The 77 year old patient with a history of persistent atrial fibrillation, status post ablation and Watchman device placement, presents for follow-up. The patient experienced an episode of Afib with RVR in June 2024, which was managed with metoprolol 50mg . The patient's atrial fibrillation rate at that time was between 120 and 130. Since then, the patient has been started on Wegovy in June 2024. The patient also has a history of coronary artery disease, status post LAD stent placement. The patient's ablation for atrial fibrillation took place on December 26, 2021, and an echocardiogram showed an EF of 65%. The patient is currently on aspirin 81mg , losartan 100mg , Crestor 20mg , Wegovy 2.4mg , and metoprolol 75mg  twice daily.  The patient reports improved endurance and has been attending the gym regularly, participating in water aerobics. The patient has also been on the highest dose of Wegovy for about a month, which has recently started to affect her appetite, decreasing it. The patient has not noticed any significant weight loss yet but is hopeful that the higher dose will have an effect. The patient has not had any further episodes of atrial fibrillation since the episode in June 2024. The patient reports feeling well overall and is actively engaged in various activities and travels.             Physical Exam:   VS:  BP 120/62   Pulse 72   Ht 5\' 2"  (1.575 m)   Wt 183 lb 3.2 oz (83.1 kg)   SpO2 95%   BMI 33.51 kg/m    Wt Readings from Last 3 Encounters:  04/22/23 183 lb 3.2 oz (83.1 kg)  01/10/23 183 lb 9.6 oz (83.3 kg)   11/20/22 186 lb (84.4 kg)    GEN: Well nourished, well developed in no acute distress NECK: No JVD; No carotid bruits CARDIAC: RRR, no murmurs, no rubs, no gallops RESPIRATORY:  Clear to auscultation without rales, wheezing or rhonchi  ABDOMEN: Soft, non-tender, non-distended EXTREMITIES:  No edema; No deformity   ASSESSMENT AND PLAN: .    Assessment and Plan    Persistent Atrial Fibrillation Persistent atrial fibrillation, status post ablation and Watchman device. Episode of atrial fibrillation with RVR in June 2024, managed with metoprolol 50 mg. Current management includes metoprolol 75 mg twice daily. No recent episodes noted. Discussed risks of atrial fibrillation including stroke and heart failure. Benefits of current management include rhythm control and symptom relief. Patient prefers to continue current medication regimen. - Continue metoprolol 75 mg twice daily - Follow up with Dr. Elberta Fortis in December 2024  Coronary Artery Disease Coronary artery disease, status post LAD stent by Dr. Excell Seltzer. No current chest pain. Echocardiogram shows EF 65%. Discussed risks of coronary artery disease including myocardial infarction and need for further interventions. Benefits of current medications include reduced risk of cardiac events. Patient prefers to continue current medication regimen. - Continue aspirin 81 mg daily - Continue Crestor 20 mg daily - Continue losartan 100 mg daily  Weight loss -currently managed with Wegovy. Patient reports minimal weight loss but notes decreased appetite on the highest dose (2.4  mg). Discussed risks of obesity including cardiovascular disease and diabetes. Benefits of Wegovy include weight loss and improved metabolic profile. Patient prefers to continue current medication regimen. - Continue Wegovy 2.4 mg - Monitor weight and appetite, reassess in a few months  General Health Maintenance Engaging in regular physical activity, including water aerobics  and weight training. Reports improved endurance. Discussed benefits of physical activity including improved cardiovascular health and weight management. - Encourage continued physical activity   Follow-up - Schedule follow-up appointment in one year unless issues arise.              Signed, Donato Schultz, MD

## 2023-04-22 NOTE — Patient Instructions (Signed)

## 2023-04-24 ENCOUNTER — Telehealth: Payer: Self-pay | Admitting: Cardiology

## 2023-04-24 NOTE — Telephone Encounter (Signed)
*  STAT* If patient is at the pharmacy, call can be transferred to refill team.   1. Which medications need to be refilled? (please list name of each medication and dose if known)   Metoprolol Tartrate 75 MG TABS     2. Would you like to learn more about the convenience, safety, & potential cost savings by using the [facility]? N/A   3. Are you open to using the [facility] (Type [facility]. N/A   4. Which pharmacy/location (including street and city if local pharmacy) is medication to be sent to?  [id number] - [place], [place] - [address] AT [facility] OF [address]     5. Do they need a 30 day or 90 day supply? 90 day

## 2023-04-25 MED ORDER — METOPROLOL TARTRATE 75 MG PO TABS: 75.0000 mg | ORAL_TABLET | Freq: Two times a day (BID) | ORAL | 3 refills | Status: DC

## 2023-04-25 NOTE — Telephone Encounter (Signed)
Rx sent to [facility]

## 2023-05-12 ENCOUNTER — Encounter: Payer: Self-pay | Admitting: Cardiology

## 2023-05-14 ENCOUNTER — Ambulatory Visit (HOSPITAL_COMMUNITY)
Admission: RE | Admit: 2023-05-14 | Discharge: 2023-05-14 | Disposition: A | Discharge status: Home / Self Care | Payer: Medicare Other | Source: Ambulatory Visit | Attending: Physician Assistant | Admitting: Physician Assistant

## 2023-05-14 ENCOUNTER — Other Ambulatory Visit: Payer: Self-pay | Admitting: Cardiology

## 2023-05-14 VITALS — BP 112/58 | HR 74 | Ht 62.0 in | Wt 181.2 lb

## 2023-05-14 DIAGNOSIS — Z955 Presence of coronary angioplasty implant and graft: Secondary | ICD-10-CM | POA: Insufficient documentation

## 2023-05-14 DIAGNOSIS — I2510 Atherosclerotic heart disease of native coronary artery without angina pectoris: Secondary | ICD-10-CM | POA: Diagnosis not present

## 2023-05-14 DIAGNOSIS — I480 Paroxysmal atrial fibrillation: Secondary | ICD-10-CM | POA: Diagnosis present

## 2023-05-14 DIAGNOSIS — D6869 Other thrombophilia: Secondary | ICD-10-CM | POA: Diagnosis not present

## 2023-05-14 DIAGNOSIS — E669 Obesity, unspecified: Secondary | ICD-10-CM | POA: Insufficient documentation

## 2023-05-14 DIAGNOSIS — Z6833 Body mass index (BMI) 33.0-33.9, adult: Secondary | ICD-10-CM | POA: Insufficient documentation

## 2023-05-14 DIAGNOSIS — I10 Essential (primary) hypertension: Secondary | ICD-10-CM | POA: Insufficient documentation

## 2023-05-14 DIAGNOSIS — Z79899 Other long term (current) drug therapy: Secondary | ICD-10-CM | POA: Insufficient documentation

## 2023-05-14 MED ORDER — AMIODARONE HCL 200 MG PO TABS: ORAL_TABLET | ORAL | 3 refills | Status: DC

## 2023-05-14 NOTE — Progress Notes (Signed)
Primary Care Physician: Aliene Beams, MD Primary Cardiologist: Dr Anne Fu Primary Electrophysiologist: Dr Elberta Fortis Referring Physician: Dr Gerda Diss Colleen Cole is a 77 y.o. female with a history of CAD, HTN, HLD, atrial fibrillation who presents for follow up in the [facility].  Patient has been maintained on amiodarone and underwent afib ablation 12/26/21. Patient is s/p Watchman implant 07/11/22.   On follow up today, patient reports that over the past several weeks she has had more frequent afib, now occurring every 2-3 days. Apple Watch strips personally reviewed which show true afib episodes with heart rates 110s-120s. There are no specific triggers that she can identify. She has symptoms of tachypalpitations and generalized weakness when in afib.  Today, she denies symptoms of chest pain, orthopnea, PND, lower extremity edema, dizziness, presyncope, syncope, snoring, daytime somnolence, bleeding, or neurologic sequela. The patient is tolerating medications without difficulties and is otherwise without complaint today.    Atrial Fibrillation Risk Factors:  she does not have symptoms or diagnosis of sleep apnea. she does not have a history of rheumatic fever.   Atrial Fibrillation Management history:  Previous antiarrhythmic drugs: amiodarone  Previous cardioversions: none Previous ablations: 12/26/21 Anticoagulation history: Xarelto    Past Medical History:  Diagnosis Date   Carotid disease, bilateral (HCC)    Coronary artery disease    Hyperlipidemia    Hypertension    Paroxysmal A-fib (HCC)    Presence of Watchman left atrial appendage closure device 07/11/2022   27mm Watchman implant with Dr. Excell Seltzer    Current Outpatient Medications  Medication Sig Dispense Refill   acetaminophen (TYLENOL) 500 MG tablet Take 1,000 mg by mouth as needed for moderate pain (pain score 4-6).     alendronate (FOSAMAX) 70 MG tablet Take 70 mg by mouth  every Sunday.     aspirin EC 81 MG tablet Take 1 tablet (81 mg total) by mouth daily. Swallow whole. START ON 3/25 90 tablet 3   budesonide-formoterol (SYMBICORT) 160-4.5 MCG/ACT inhaler 1 puff daily.     Calcium Carb-Cholecalciferol (CALCIUM 600 + D PO) Take 1 tablet by mouth 2 (two) times daily.     Carboxymethylcellul-Glycerin (LUBRICATING EYE DROPS OP) Place 1 drop into both eyes daily as needed (dry eyes).     Docusate Calcium (STOOL SOFTENER PO) Take 2 capsules by mouth every evening.     escitalopram (LEXAPRO) 5 MG tablet Take 5 mg by mouth daily.     fluticasone (FLONASE) 50 MCG/ACT nasal spray Place 1 spray into both nostrils daily as needed for allergies or rhinitis.     hydrALAZINE (APRESOLINE) 25 MG tablet Take 1 tablet (25 mg total) by mouth 2 (two) times daily. (Patient taking differently: Take 25-50 mg by mouth See admin instructions. 2 tabs in the am, 1 at noon, 2 tabs at pm) 180 tablet 3   hydrochlorothiazide (HYDRODIURIL) 25 MG tablet Take 25 mg by mouth daily.     levocetirizine (XYZAL) 5 MG tablet Take 5 mg by mouth every evening.     losartan (COZAAR) 100 MG tablet Take 100 mg by mouth every evening.     Magnesium 400 MG TABS Take 2 tablets by mouth daily in the afternoon.     Metoprolol Tartrate 75 MG TABS Take 1 tablet (75 mg total) by mouth in the morning and at bedtime. 180 tablet 3   nitroGLYCERIN (NITROSTAT) 0.4 MG SL tablet Place 1 tablet (0.4 mg total) under the tongue every 5 (five)  minutes as needed. 25 tablet 2   rosuvastatin (CRESTOR) 20 MG tablet Take 1 tablet (20 mg total) by mouth every evening. 90 tablet 3   Semaglutide-Weight Management (WEGOVY) 2.4 MG/0.75ML SOAJ Inject 2.4 mg into the skin once a week. 3 mL 11   senna (SENOKOT) 8.6 MG TABS tablet Take 2 tablets by mouth every evening.     VITAMIN D PO Take 1 capsule by mouth daily.     No current facility-administered medications for this encounter.    ROS- All systems are reviewed and negative except as  per the HPI above.  Physical Exam: Vitals:   05/14/23 1321  BP: (!) 112/58  Pulse: 74  Weight: 82.2 kg  Height: 5\' 2"  (1.575 m)     GEN: Well nourished, well developed in no acute distress NECK: No JVD; No carotid bruits CARDIAC: Regular rate and rhythm, no murmurs, rubs, gallops RESPIRATORY:  Clear to auscultation without rales, wheezing or rhonchi  ABDOMEN: Soft, non-tender, non-distended EXTREMITIES:  No edema; No deformity    Wt Readings from Last 3 Encounters:  05/14/23 82.2 kg  04/22/23 83.1 kg  01/10/23 83.3 kg    EKG today demonstrates  SR Vent. rate 74 BPM PR interval 144 ms QRS duration 72 ms QT/QTcB 372/412 ms   Echo 08/21/21 demonstrated   1. Left ventricular ejection fraction, by estimation, is 60 to 65%. The  left ventricle has normal function. The left ventricle has no regional  wall motion abnormalities. Left ventricular diastolic parameters were  normal. The average left ventricular global longitudinal strain is 27.3 %. The global longitudinal strain is normal.   2. Right ventricular systolic function is normal. The right ventricular  size is normal. There is normal pulmonary artery systolic pressure. The  estimated right ventricular systolic pressure is 26.8 mmHg.   3. Left atrial size was moderately dilated.   4. The mitral valve is normal in structure. Trivial mitral valve  regurgitation. No evidence of mitral stenosis.   5. The aortic valve is tricuspid. Aortic valve regurgitation is not  visualized. No aortic stenosis is present.   6. The inferior vena cava is normal in size with greater than 50%  respiratory variability, suggesting right atrial pressure of 3 mmHg.  Epic records are reviewed at length today  CHA2DS2-VASc Score = 5  The patient's score is based upon: CHF History: 0 HTN History: 1 Diabetes History: 0 Stroke History: 0 Vascular Disease History: 1 Age Score: 2 Gender Score: 1         ASSESSMENT AND PLAN: Paroxysmal  Atrial Fibrillation (ICD10:  I48.0) The patient's CHA2DS2-VASc score is 5, indicating a 7.2% annual risk of stroke.   S/p afib ablation 12/26/21, off amiodarone  Patient having more frequent afib episodes, in SR currently. We discussed rhythm control options today. Patient interested in repeat ablation. She already has a visit with Dr Elberta Fortis on 12/24 and plans to discuss it with him then. Short term, will resume amiodarone 200 mg BID x 2 weeks then 200 mg daily as a bridge to ablation.   Continue Lopressor 75 mg BID Not currently on anticoagulation s/p Watchman  Secondary Hypercoagulable State (ICD10:  D68.69) The patient is at significant risk for stroke/thromboembolism based upon her CHA2DS2-VASc Score of 5.  S/p Watchman implant 07/11/22. On ASA  Obesity Body mass index is 33.14 kg/m.  Encouraged lifestyle modification  HTN Stable on current regimen  CAD S/p LAD stent No anginal symptoms   Follow up with Dr Elberta Fortis  as scheduled.    Jorja Loa PA-C Afib Clinic [facility] [address] [place], [place] [address] [phone] 05/14/2023 1:47 PM

## 2023-05-14 NOTE — Patient Instructions (Addendum)
Start Amiodarone 200mg  twice a day for 14 days then reduce to once a day  Follow up with Dr. Elberta Fortis as scheduled.

## 2023-05-16 NOTE — Telephone Encounter (Signed)
Followed up with pt. Pt saw AFib clinic couple days ago. Understands to keep appt on 12/24 w/ Dr. Elberta Fortis.

## 2023-05-20 ENCOUNTER — Other Ambulatory Visit (HOSPITAL_COMMUNITY): Payer: Self-pay

## 2023-05-20 ENCOUNTER — Telehealth: Payer: Self-pay | Admitting: Pharmacy Technician

## 2023-05-20 NOTE — Telephone Encounter (Signed)
Pharmacy Patient Advocate Encounter   Received notification from CoverMyMeds that prior authorization for metoprolol is required/requested.   Insurance verification completed.   The patient is insured through [facility] .   Per test claim: PA required; PA submitted to above mentioned insurance via CoverMyMeds Key/confirmation #/EOC [id number] Status is pending

## 2023-05-21 NOTE — Telephone Encounter (Signed)
Pharmacy Patient Advocate Encounter  Received notification from [facility] that Prior Authorization for metoprolol has been APPROVED from 05/21/23 to 05/19/24   PA #/Case ID/Reference #: [id number]

## 2023-05-25 NOTE — Progress Notes (Unsigned)
Electrophysiology Office Note:   Date:  05/27/2023  ID:  Colleen Cole, DOB 24-Sep-1945, MRN [id number]  Primary Cardiologist: Donato Schultz, MD Primary Heart Failure: None Electrophysiologist: Colleen Nunn Jorja Loa, MD      History of Present Illness:   Colleen Cole is a 77 y.o. female with h/o atrial fibrillation, hypertension, coronary artery disease seen today for routine electrophysiology followup.   Since last being seen in our clinic the patient reports doing overall well.  She unfortunately had continued to have episodes of atrial fibrillation.  She has since been started on amiodarone without further episodes.  She has been feeling well and is able to do her daily activities.  She would prefer to avoid long-term antiarrhythmics.  she denies chest pain, palpitations, dyspnea, PND, orthopnea, nausea, vomiting, dizziness, syncope, edema, weight gain, or early satiety.   Review of systems complete and found to be negative unless listed in HPI.   EP Information / Studies Reviewed:    EKG is not ordered today. EKG from 05/14/23 reviewed which showed sinus rhythm        Risk Assessment/Calculations:    CHA2DS2-VASc Score = 5   This indicates a 7.2% annual risk of stroke. The patient's score is based upon: CHF History: 0 HTN History: 1 Diabetes History: 0 Stroke History: 0 Vascular Disease History: 1 Age Score: 2 Gender Score: 1            Physical Exam:   VS:  BP 126/70 (BP Location: Left Arm, Patient Position: Sitting, Cuff Size: Normal)   Pulse 68   Ht 5\' 2"  (1.575 m)   Wt 184 lb 9.6 oz (83.7 kg)   SpO2 97%   BMI 33.76 kg/m    Wt Readings from Last 3 Encounters:  05/27/23 184 lb 9.6 oz (83.7 kg)  05/14/23 181 lb 3.2 oz (82.2 kg)  04/22/23 183 lb 3.2 oz (83.1 kg)     GEN: Well nourished, well developed in no acute distress NECK: No JVD; No carotid bruits CARDIAC: Regular rate and rhythm, no murmurs, rubs, gallops RESPIRATORY:  Clear to auscultation  without rales, wheezing or rhonchi  ABDOMEN: Soft, non-tender, non-distended EXTREMITIES:  No edema; No deformity   ASSESSMENT AND PLAN:    1.  Paroxysmal atrial fibrillation: Post ablation 12/26/2021.  Continued to have episodes of atrial fibrillation making her feel quite poorly.  He is now on amiodarone.  Amiodarone is controlling her symptoms, but she does not wish for this to be a long-term medication.  Due to that, we Armstrong Creasy plan for ablation.  Risks and benefits have been discussed.  She understands the risks and is agreed to the procedure.  Risk, benefits, and alternatives to EP study and radiofrequency/pulse field ablation for afib were also discussed in detail today. These risks include but are not limited to stroke, bleeding, vascular damage, tamponade, perforation, damage to the esophagus, lungs, and other structures, pulmonary vein stenosis, worsening renal function, and death. The patient understands these risk and wishes to proceed.  We Michayla Mcneil therefore proceed with catheter ablation at the next available time.  Carto, ICE, anesthesia are requested for the procedure.  Cynitha Berte also obtain CT PV protocol prior to the procedure to exclude LAA thrombus and further evaluate atrial anatomy.  2.  Coronary disease: Post LAD stent.  Plan per primary cardiology  3.  Hypertension: Currently well-controlled  4.  Secondary hypercoagulable state: Currently on Xarelto for atrial fibrillation  Follow up with Dr. Elberta Fortis as usual post procedure  Signed, Colleen Cole Jorja Loa, MD

## 2023-05-27 ENCOUNTER — Encounter: Payer: Self-pay | Admitting: Cardiology

## 2023-05-27 ENCOUNTER — Ambulatory Visit: Payer: Medicare Other | Attending: Cardiology | Admitting: Cardiology

## 2023-05-27 VITALS — BP 126/70 | HR 68 | Ht 62.0 in | Wt 184.6 lb

## 2023-05-27 DIAGNOSIS — D6869 Other thrombophilia: Secondary | ICD-10-CM | POA: Diagnosis present

## 2023-05-27 DIAGNOSIS — I480 Paroxysmal atrial fibrillation: Secondary | ICD-10-CM | POA: Insufficient documentation

## 2023-05-27 DIAGNOSIS — I10 Essential (primary) hypertension: Secondary | ICD-10-CM | POA: Insufficient documentation

## 2023-05-27 DIAGNOSIS — I2510 Atherosclerotic heart disease of native coronary artery without angina pectoris: Secondary | ICD-10-CM | POA: Diagnosis present

## 2023-05-27 MED ORDER — METOPROLOL TARTRATE 50 MG PO TABS: 50.0000 mg | ORAL_TABLET | Freq: Two times a day (BID) | ORAL | 3 refills | Status: AC

## 2023-05-27 NOTE — Patient Instructions (Addendum)
Medication Instructions:  Your physician has recommended you make the following change in your medication:  DECREASE Metoprolol Tartrate to 50 mg twice daily  *If you need a refill on your cardiac medications before your next appointment, please call your pharmacy*   Lab Work: Pre procedure labs -- we will call you to schedule:  BMP & CBC  If you have a lab test that is abnormal and we need to change your treatment, we will call you to review the results -- otherwise no news is good news.    Testing/Procedures: Your physician has requested that you have cardiac CT 1 month PRIOR to your ablation. Cardiac computed tomography (CT) is a painless test that uses an x-ray machine to take clear, detailed pictures of your heart.  We will call you to schedule.  Your physician has recommended that you have an ablation. Catheter ablation is a medical procedure used to treat some cardiac arrhythmias (irregular heartbeats). During catheter ablation, a long, thin, flexible tube is put into a blood vessel in your groin (upper thigh), or neck. This tube is called an ablation catheter. It is then guided to your heart through the blood vessel. Radio frequency waves destroy small areas of heart tissue where abnormal heartbeats may cause an arrhythmia to start.   We will call you to schedule this procedure.  It may be several weeks or more before calling.   Follow-Up: At [facility], you and your health needs are our priority.  As part of our continuing mission to provide you with exceptional heart care, we have created designated Provider Care Teams.  These Care Teams include your primary Cardiologist (physician) and Advanced Practice Providers (APPs -  Physician Assistants and Nurse Practitioners) who all work together to provide you with the care you need, when you need it.  Your next appointment:   1 month(s) after your ablation  The format for your next appointment:   In Person  Provider:   AFib  clinic   Thank you for choosing [facility]!!   Dory Horn, RN [phone]    Other Instructions   Cardiac Ablation Cardiac ablation is a procedure to destroy (ablate) some heart tissue that is sending bad signals. These bad signals cause problems in heart rhythm. The heart has many areas that make these signals. If there are problems in these areas, they can make the heart beat in a way that is not normal. Destroying some tissues can help make the heart rhythm normal. Tell your doctor about: Any allergies you have. All medicines you are taking. These include vitamins, herbs, eye drops, creams, and over-the-counter medicines. Any problems you or family members have had with medicines that make you fall asleep (anesthetics). Any blood disorders you have. Any surgeries you have had. Any medical conditions you have, such as kidney failure. Whether you are pregnant or may be pregnant. What are the risks? This is a safe procedure. But problems may occur, including: Infection. Bruising and bleeding. Bleeding into the chest. Stroke or blood clots. Damage to nearby areas of your body. Allergies to medicines or dyes. The need for a pacemaker if the normal system is damaged. Failure of the procedure to treat the problem. What happens before the procedure? Medicines Ask your doctor about: Changing or stopping your normal medicines. This is important. Taking aspirin and ibuprofen. Do not take these medicines unless your doctor tells you to take them. Taking other medicines, vitamins, herbs, and supplements. General instructions Follow instructions from your  doctor about what you cannot eat or drink. Plan to have someone take you home from the hospital or clinic. If you will be going home right after the procedure, plan to have someone with you for 24 hours. Ask your doctor what steps will be taken to prevent infection. What happens during the procedure?  An IV tube will be  put into one of your veins. You will be given a medicine to help you relax. The skin on your neck or groin will be numbed. A cut (incision) will be made in your neck or groin. A needle will be put through your cut and into a large vein. A tube (catheter) will be put into the needle. The tube will be moved to your heart. Dye may be put through the tube. This helps your doctor see your heart. Small devices (electrodes) on the tube will send out signals. A type of energy will be used to destroy some heart tissue. The tube will be taken out. Pressure will be held on your cut. This helps stop bleeding. A bandage will be put over your cut. The exact procedure may vary among doctors and hospitals. What happens after the procedure? You will be watched until you leave the hospital or clinic. This includes checking your heart rate, breathing rate, oxygen, and blood pressure. Your cut will be watched for bleeding. You will need to lie still for a few hours. Do not drive for 24 hours or as long as your doctor tells you. Summary Cardiac ablation is a procedure to destroy some heart tissue. This is done to treat heart rhythm problems. Tell your doctor about any medical conditions you may have. Tell him or her about all medicines you are taking to treat them. This is a safe procedure. But problems may occur. These include infection, bruising, bleeding, and damage to nearby areas of your body. Follow what your doctor tells you about food and drink. You may also be told to change or stop some of your medicines. After the procedure, do not drive for 24 hours or as long as your doctor tells you. This information is not intended to replace advice given to you by your health care provider. Make sure you discuss any questions you have with your health care provider. Document Revised: 08/10/2021 Document Reviewed: 04/22/2019 Elsevier Patient Education  2023 Elsevier Inc.   Cardiac Ablation, Care After  This  sheet gives you information about how to care for yourself after your procedure. Your health care provider may also give you more specific instructions. If you have problems or questions, contact your health care provider. What can I expect after the procedure? After the procedure, it is common to have: Bruising around your puncture site. Tenderness around your puncture site. Skipped heartbeats. If you had an atrial fibrillation ablation, you may have atrial fibrillation during the first several months after your procedure.  Tiredness (fatigue).  Follow these instructions at home: Puncture site care  Follow instructions from your health care provider about how to take care of your puncture site. Make sure you: If present, leave stitches (sutures), skin glue, or adhesive strips in place. These skin closures may need to stay in place for up to 2 weeks. If adhesive strip edges start to loosen and curl up, you may trim the loose edges. Do not remove adhesive strips completely unless your health care provider tells you to do that. If a large square bandage is present, this may be removed 24 hours after surgery.  Check your puncture site every day for signs of infection. Check for: Redness, swelling, or pain. Fluid or blood. If your puncture site starts to bleed, lie down on your back, apply firm pressure to the area, and contact your health care provider. Warmth. Pus or a bad smell. A pea or small marble sized lump at the site is normal and can take up to three months to resolve.  Driving Do not drive for at least 4 days after your procedure or however long your health care provider recommends. (Do not resume driving if you have previously been instructed not to drive for other health reasons.) Do not drive or use heavy machinery while taking prescription pain medicine. Activity Avoid activities that take a lot of effort for at least 7 days after your procedure. Do not lift anything that is  heavier than 5 lb (4.5 kg) for one week.  No sexual activity for 1 week.  Return to your normal activities as told by your health care provider. Ask your health care provider what activities are safe for you. General instructions Take over-the-counter and prescription medicines only as told by your health care provider. Do not use any products that contain nicotine or tobacco, such as cigarettes and e-cigarettes. If you need help quitting, ask your health care provider. You may shower after 24 hours, but Do not take baths, swim, or use a hot tub for 1 week.  Do not drink alcohol for 24 hours after your procedure. Keep all follow-up visits as told by your health care provider. This is important. Contact a health care provider if: You have redness, mild swelling, or pain around your puncture site. You have fluid or blood coming from your puncture site that stops after applying firm pressure to the area. Your puncture site feels warm to the touch. You have pus or a bad smell coming from your puncture site. You have a fever. You have chest pain or discomfort that spreads to your neck, jaw, or arm. You have chest pain that is worse with lying on your back or taking a deep breath. You are sweating a lot. You feel nauseous. You have a fast or irregular heartbeat. You have shortness of breath. You are dizzy or light-headed and feel the need to lie down. You have pain or numbness in the arm or leg closest to your puncture site. Get help right away if: Your puncture site suddenly swells. Your puncture site is bleeding and the bleeding does not stop after applying firm pressure to the area. These symptoms may represent a serious problem that is an emergency. Do not wait to see if the symptoms will go away. Get medical help right away. Call your local emergency services (911 in the [place] Do not drive yourself to the hospital. Summary After the procedure, it is normal to have bruising and tenderness  at the puncture site in your groin, neck, or forearm. Check your puncture site every day for signs of infection. Get help right away if your puncture site is bleeding and the bleeding does not stop after applying firm pressure to the area. This is a medical emergency. This information is not intended to replace advice given to you by your health care provider. Make sure you discuss any questions you have with your health care provider.

## 2023-06-24 ENCOUNTER — Telehealth: Payer: Self-pay | Admitting: *Deleted

## 2023-06-24 DIAGNOSIS — I480 Paroxysmal atrial fibrillation: Secondary | ICD-10-CM

## 2023-06-24 NOTE — Telephone Encounter (Signed)
Scheduled repeat afib ablation 08/04/23.   Aware I will reach out to PCP office for recent blood work to confirm Xarelto dosing.  She understands I will call back with advisement on when to restart this/dosing, aware it may be this week/next before calling back on this. Aware office will call at a later date to go over instructions. Patient verbalized understanding and agreeable to plan.    (Spoke to PCP, last Creatinine WNL and they are faxing result)

## 2023-06-26 MED ORDER — RIVAROXABAN 20 MG PO TABS: 20.0000 mg | ORAL_TABLET | Freq: Every day | ORAL | 5 refills | Status: DC

## 2023-06-26 NOTE — Addendum Note (Signed)
Addended by: Baird Lyons on: 06/26/2023 01:53 PM   Modules accepted: Orders

## 2023-06-26 NOTE — Telephone Encounter (Signed)
Pt advised to restart Xarelto (PCP sent over recent blood work) 20 mg daily. Rx sent to pharmacy as requested by patient. Patient verbalized understanding and agreeable to plan.

## 2023-07-01 ENCOUNTER — Encounter: Payer: Self-pay | Admitting: Cardiology

## 2023-07-09 ENCOUNTER — Telehealth: Payer: Self-pay

## 2023-07-09 DIAGNOSIS — I480 Paroxysmal atrial fibrillation: Secondary | ICD-10-CM

## 2023-07-09 NOTE — Telephone Encounter (Signed)
 Called pt and went over CT/Ablation Instructions.. She will come to [facility] on 2/6 for updated labwork.  She will pick up Instruction letters at that time.

## 2023-07-11 LAB — CBC
Hematocrit: 36.7 % (ref 34.0–46.6)
Hemoglobin: 12.5 g/dL (ref 11.1–15.9)
MCH: 31.2 pg (ref 26.6–33.0)
MCHC: 34.1 g/dL (ref 31.5–35.7)
MCV: 92 fL (ref 79–97)
Platelets: 231 10*3/uL (ref 150–450)
RBC: 4.01 x10E6/uL (ref 3.77–5.28)
RDW: 12.8 % (ref 11.7–15.4)
WBC: 4.6 10*3/uL (ref 3.4–10.8)

## 2023-07-11 LAB — BASIC METABOLIC PANEL
BUN/Creatinine Ratio: 18 (ref 12–28)
BUN: 16 mg/dL (ref 8–27)
CO2: 24 mmol/L (ref 20–29)
Calcium: 9.9 mg/dL (ref 8.7–10.3)
Chloride: 101 mmol/L (ref 96–106)
Creatinine, Ser: 0.9 mg/dL (ref 0.57–1.00)
Glucose: 87 mg/dL (ref 70–99)
Potassium: 4.2 mmol/L (ref 3.5–5.2)
Sodium: 139 mmol/L (ref 134–144)
eGFR: 67 mL/min/{1.73_m2} (ref >59)

## 2023-07-14 ENCOUNTER — Ambulatory Visit (HOSPITAL_COMMUNITY)
Admission: RE | Admit: 2023-07-14 | Discharge: 2023-07-14 | Disposition: A | Discharge status: Home / Self Care | Payer: Medicare Other | Source: Ambulatory Visit | Attending: Cardiology | Admitting: Cardiology

## 2023-07-14 DIAGNOSIS — I480 Paroxysmal atrial fibrillation: Secondary | ICD-10-CM | POA: Diagnosis present

## 2023-07-14 MED ORDER — IOHEXOL 350 MG/ML SOLN
95.0000 mL | Freq: Once | INTRAVENOUS | Status: AC | PRN
  Administered 2023-07-14: 95 mL via INTRAVENOUS

## 2023-07-27 NOTE — Pre-Procedure Instructions (Signed)
 Attempted to call patient regarding procedure on 08/04/23 with anesthesia.  Anesthesia requires Wegovy to be held 7 days before procedure.  Left a voicemail to not take wegovy after today until after procedure.

## 2023-08-01 NOTE — Pre-Procedure Instructions (Signed)
 Instructed patient on the following items: Arrival time 1000 Nothing to eat or drink after midnight No meds AM of procedure Responsible person to drive you home and stay with you for 24 hrs  Have you missed any doses of anti-coagulant Xarelto- takes once a day, hasn't missed any doses.  Don't take dose on Monday morning.

## 2023-08-04 ENCOUNTER — Ambulatory Visit (HOSPITAL_COMMUNITY): Payer: Self-pay | Admitting: Anesthesiology

## 2023-08-04 ENCOUNTER — Encounter (HOSPITAL_COMMUNITY)
Admission: RE | Disposition: A | Discharge status: Home / Self Care | Payer: Self-pay | Source: Home / Self Care | Attending: Cardiology

## 2023-08-04 ENCOUNTER — Other Ambulatory Visit: Payer: Self-pay

## 2023-08-04 ENCOUNTER — Ambulatory Visit (HOSPITAL_BASED_OUTPATIENT_CLINIC_OR_DEPARTMENT_OTHER): Payer: Self-pay | Admitting: Anesthesiology

## 2023-08-04 ENCOUNTER — Ambulatory Visit (HOSPITAL_COMMUNITY)
Admission: RE | Admit: 2023-08-04 | Discharge: 2023-08-04 | Disposition: A | Discharge status: Home / Self Care | Payer: Medicare Other | Attending: Cardiology | Admitting: Cardiology

## 2023-08-04 DIAGNOSIS — G4730 Sleep apnea, unspecified: Secondary | ICD-10-CM | POA: Diagnosis not present

## 2023-08-04 DIAGNOSIS — E785 Hyperlipidemia, unspecified: Secondary | ICD-10-CM | POA: Diagnosis not present

## 2023-08-04 DIAGNOSIS — Z79899 Other long term (current) drug therapy: Secondary | ICD-10-CM | POA: Insufficient documentation

## 2023-08-04 DIAGNOSIS — I25119 Atherosclerotic heart disease of native coronary artery with unspecified angina pectoris: Secondary | ICD-10-CM | POA: Diagnosis not present

## 2023-08-04 DIAGNOSIS — Z87891 Personal history of nicotine dependence: Secondary | ICD-10-CM

## 2023-08-04 DIAGNOSIS — I4891 Unspecified atrial fibrillation: Secondary | ICD-10-CM | POA: Diagnosis not present

## 2023-08-04 DIAGNOSIS — I2510 Atherosclerotic heart disease of native coronary artery without angina pectoris: Secondary | ICD-10-CM | POA: Diagnosis not present

## 2023-08-04 DIAGNOSIS — I480 Paroxysmal atrial fibrillation: Secondary | ICD-10-CM | POA: Diagnosis present

## 2023-08-04 DIAGNOSIS — I10 Essential (primary) hypertension: Secondary | ICD-10-CM | POA: Insufficient documentation

## 2023-08-04 DIAGNOSIS — Z7901 Long term (current) use of anticoagulants: Secondary | ICD-10-CM | POA: Diagnosis not present

## 2023-08-04 HISTORY — PX: ATRIAL FIBRILLATION ABLATION: EP1191

## 2023-08-04 SURGERY — ATRIAL FIBRILLATION ABLATION
Anesthesia: General

## 2023-08-04 MED ORDER — ACETAMINOPHEN 325 MG PO TABS: 650.0000 mg | ORAL_TABLET | ORAL | Status: DC | PRN

## 2023-08-04 MED ORDER — SODIUM CHLORIDE 0.9 % IV SOLN: INTRAVENOUS | Status: DC

## 2023-08-04 MED ORDER — DEXAMETHASONE SODIUM PHOSPHATE 10 MG/ML IJ SOLN
INTRAMUSCULAR | Status: DC | PRN
  Administered 2023-08-04: 10 mg via INTRAVENOUS

## 2023-08-04 MED ORDER — PROTAMINE SULFATE 10 MG/ML IV SOLN
INTRAVENOUS | Status: DC | PRN
  Administered 2023-08-04: 25 mg via INTRAVENOUS
  Administered 2023-08-04: 15 mg via INTRAVENOUS

## 2023-08-04 MED ORDER — SUGAMMADEX SODIUM 200 MG/2ML IV SOLN
INTRAVENOUS | Status: DC | PRN
  Administered 2023-08-04: 400 mg via INTRAVENOUS

## 2023-08-04 MED ORDER — ATROPINE SULFATE 1 MG/10ML IJ SOSY
PREFILLED_SYRINGE | INTRAMUSCULAR | Status: DC | PRN
Start: 2023-08-04 — End: 2023-08-04
  Administered 2023-08-04: 1 mg via INTRAVENOUS

## 2023-08-04 MED ORDER — SODIUM CHLORIDE 0.9 % IV SOLN: 250.0000 mL | INTRAVENOUS | Status: DC | PRN

## 2023-08-04 MED ORDER — PHENYLEPHRINE HCL-NACL 20-0.9 MG/250ML-% IV SOLN
INTRAVENOUS | Status: DC | PRN
  Administered 2023-08-04: 30 ug/min via INTRAVENOUS

## 2023-08-04 MED ORDER — LIDOCAINE 2% (20 MG/ML) 5 ML SYRINGE
INTRAMUSCULAR | Status: DC | PRN
  Administered 2023-08-04: 60 mg via INTRAVENOUS

## 2023-08-04 MED ORDER — HEPARIN (PORCINE) IN NACL 1000-0.9 UT/500ML-% IV SOLN
INTRAVENOUS | Status: DC | PRN
  Administered 2023-08-04 (×3): 500 mL

## 2023-08-04 MED ORDER — FENTANYL CITRATE (PF) 100 MCG/2ML IJ SOLN
INTRAMUSCULAR | Status: DC | PRN
  Administered 2023-08-04: 100 ug via INTRAVENOUS

## 2023-08-04 MED ORDER — FENTANYL CITRATE (PF) 100 MCG/2ML IJ SOLN
INTRAMUSCULAR | Status: AC
  Filled 2023-08-04: qty 2

## 2023-08-04 MED ORDER — HEPARIN SODIUM (PORCINE) 1000 UNIT/ML IJ SOLN
INTRAMUSCULAR | Status: DC | PRN
  Administered 2023-08-04: 14000 [IU] via INTRAVENOUS

## 2023-08-04 MED ORDER — ONDANSETRON HCL 4 MG/2ML IJ SOLN
INTRAMUSCULAR | Status: DC | PRN
  Administered 2023-08-04: 4 mg via INTRAVENOUS

## 2023-08-04 MED ORDER — PROPOFOL 10 MG/ML IV BOLUS
INTRAVENOUS | Status: DC | PRN
  Administered 2023-08-04: 150 mg via INTRAVENOUS
  Administered 2023-08-04: 50 mg via INTRAVENOUS

## 2023-08-04 MED ORDER — ONDANSETRON HCL 4 MG/2ML IJ SOLN
4.0000 mg | Freq: Four times a day (QID) | INTRAMUSCULAR | Status: DC | PRN
Start: 2023-08-04 — End: 2023-08-05

## 2023-08-04 MED ORDER — ROCURONIUM BROMIDE 10 MG/ML (PF) SYRINGE
PREFILLED_SYRINGE | INTRAVENOUS | Status: DC | PRN
  Administered 2023-08-04: 60 mg via INTRAVENOUS

## 2023-08-04 SURGICAL SUPPLY — 20 items
BAG SNAP BAND KOVER 36X36 (MISCELLANEOUS) IMPLANT
CABLE PFA RX CATH CONN (CABLE) IMPLANT
CATH 8FR REPROCESSED SOUNDSTAR (CATHETERS) ×1 IMPLANT
CATH 8FR SOUNDSTAR REPROCESSED (CATHETERS) IMPLANT
CATH FARAWAVE ABLATION 31 (CATHETERS) IMPLANT
CATH OCTARAY 2.0 F 3-3-3-3-3 (CATHETERS) IMPLANT
CATH WEBSTER BI DIR CS D-F CRV (CATHETERS) IMPLANT
CLOSURE MYNX CONTROL 6F/7F (Vascular Products) IMPLANT
CLOSURE PERCLOSE PROSTYLE (VASCULAR PRODUCTS) IMPLANT
COVER SWIFTLINK CONNECTOR (BAG) ×1 IMPLANT
DILATOR VESSEL 38 20CM 16FR (INTRODUCER) IMPLANT
GUIDEWIRE INQWIRE 1.5J.035X260 (WIRE) IMPLANT
INQWIRE 1.5J .035X260CM (WIRE) ×1 IMPLANT
KIT VERSACROSS CNCT FARADRIVE (KITS) IMPLANT
PACK EP LF (CUSTOM PROCEDURE TRAY) ×1 IMPLANT
PAD DEFIB RADIO PHYSIO CONN (PAD) ×1 IMPLANT
SHEATH FARADRIVE STEERABLE (SHEATH) IMPLANT
SHEATH PINNACLE 8F 10CM (SHEATH) IMPLANT
SHEATH PINNACLE 9F 10CM (SHEATH) IMPLANT
SHEATH PROBE COVER 6X72 (BAG) IMPLANT

## 2023-08-04 NOTE — Transfer of Care (Signed)
 Immediate Anesthesia Transfer of Care Note  Patient: Colleen Cole  Procedure(s) Performed: ATRIAL FIBRILLATION ABLATION  Patient Location: PACU  Anesthesia Type:General  Level of Consciousness: drowsy and patient cooperative  Airway & Oxygen Therapy: Patient Spontanous Breathing and Patient connected to face mask oxygen  Post-op Assessment: Report given to RN and Post -op Vital signs reviewed and stable  Post vital signs: Reviewed and stable  Last Vitals:  Vitals Value Taken Time  BP 143/81 08/04/23 1530  Temp 37.1 C 08/04/23 1525  Pulse 89 08/04/23 1530  Resp 11 08/04/23 1530  SpO2 94 % 08/04/23 1530  Vitals shown include unfiled device data.  Last Pain:  Vitals:   08/04/23 1525  TempSrc: Temporal  PainSc: 0-No pain         Complications: There were no known notable events for this encounter.

## 2023-08-04 NOTE — Discharge Instructions (Signed)

## 2023-08-04 NOTE — Anesthesia Procedure Notes (Signed)
 Procedure Name: Intubation Date/Time: 08/04/2023 2:02 PM  Performed by: Orlin Hilding, CRNAPre-anesthesia Checklist: Patient identified, Emergency Drugs available, Suction available, Patient being monitored and Timeout performed Patient Re-evaluated:Patient Re-evaluated prior to induction Oxygen Delivery Method: Circle system utilized Preoxygenation: Pre-oxygenation with 100% oxygen Induction Type: IV induction Ventilation: Mask ventilation without difficulty and Oral airway inserted - appropriate to patient size Laryngoscope Size: Mac and 3 Grade View: Grade I Tube type: Oral Tube size: 7.0 mm Number of attempts: 1 Placement Confirmation: ETT inserted through vocal cords under direct vision, positive ETCO2 and breath sounds checked- equal and bilateral Secured at: 22 cm Tube secured with: Tape Dental Injury: Teeth and Oropharynx as per pre-operative assessment

## 2023-08-04 NOTE — H&P (Addendum)
  Electrophysiology Office Note:   Date:  08/04/2023  ID:  Colleen Cole, DOB [birth year], MRN [id number]  Primary Cardiologist: Donato Schultz, MD Primary Heart Failure: None Electrophysiologist: Aryianna Earwood Jorja Loa, MD      History of Present Illness:   Colleen Cole is a 78 y.o. female with h/o atrial fibrillation, hypertension, coronary artery disease seen today for routine electrophysiology followup.   Today, denies symptoms of palpitations, chest pain, shortness of breath, orthopnea, PND, lower extremity edema, claudication, dizziness, presyncope, syncope, bleeding, or neurologic sequela. The patient is tolerating medications without difficulties. Plan ablation today.   EP Information / Studies Reviewed:    EKG is not ordered today. EKG from 05/14/23 reviewed which showed sinus rhythm        Risk Assessment/Calculations:    CHA2DS2-VASc Score = 5   This indicates a 7.2% annual risk of stroke. The patient's score is based upon: CHF History: 0 HTN History: 1 Diabetes History: 0 Stroke History: 0 Vascular Disease History: 1 Age Score: 2 Gender Score: 1            Physical Exam:   VS:  BP 138/73   Pulse 68   Temp 99 F (37.2 C) (Oral)   Resp 17   Ht 5\' 2"  (1.575 m)   Wt 80.7 kg   SpO2 96%   BMI 32.56 kg/m    Wt Readings from Last 3 Encounters:  08/04/23 80.7 kg  05/27/23 83.7 kg  05/14/23 82.2 kg    GEN: No acute distress.   Neck: No JVD Cardiac: RRR, no murmurs, rubs, or gallops.  Respiratory: decreased BS bases bilaterally. GI: Soft, nontender, non-distended  MS: No edema; No deformity. Neuro:  Nonfocal  Skin: warm and dry,  Psych: Normal affect    ASSESSMENT AND PLAN:    1.  Paroxysmal atrial fibrillation: Colleen Cole has presented today for surgery, with the diagnosis of AF.  The various methods of treatment have been discussed with the patient and family. After consideration of risks, benefits and other options for treatment, the patient  has consented to  Procedure(s): Catheter ablation as a surgical intervention .  Risks include but not limited to complete heart block, stroke, esophageal damage, nerve damage, bleeding, vascular damage, tamponade, perforation, MI, and death. The patient's history has been reviewed, patient examined, no change in status, stable for surgery.  I have reviewed the patient's chart and labs.  Questions were answered to the patient's satisfaction.    Yaziel Brandon Elberta Fortis, MD 08/04/2023 10:10 AM

## 2023-08-04 NOTE — Progress Notes (Signed)
 Bilateral groin dry/intact. VSS. Patient ambulated around unit. No changes noted to groin dressing; no bleeding or hematoma. Assisted patient with getting dressed. IV removed intact. Discharge instructions and post care given. Patient verbalized understanding. Patient transport via wheelchair to main entrance for d/c home.Mamie Levers

## 2023-08-04 NOTE — Anesthesia Preprocedure Evaluation (Addendum)
 Anesthesia Evaluation  Patient identified by MRN, date of birth, ID band Patient awake    Reviewed: Allergy & Precautions, NPO status , Patient's Chart, lab work & pertinent test results, reviewed documented beta blocker date and time   Airway Mallampati: II  TM Distance: >3 FB Neck ROM: Full    Dental  (+) Teeth Intact, Dental Advisory Given, Caps   Pulmonary sleep apnea and Continuous Positive Airway Pressure Ventilation , former smoker   Pulmonary exam normal breath sounds clear to auscultation       Cardiovascular hypertension, Pt. on medications and Pt. on home beta blockers + angina with exertion + CAD  + dysrhythmias Atrial Fibrillation  Rhythm:Regular Rate:Normal  S/P Watchman procedure 07/11/22 S/P A. Fib ablation 12/26/21  Echo 07/11/22 1. TEE guided left atrial appendage occlusion. 27 mm Watchman FLX placed  with 22% compression. No device leak. A small iatrogenic ASD was present  after the case with L to R shunting. A trivial pericardial effusion was  present before and after the case.  No immediate complications.   2. Left ventricular ejection fraction, by estimation, is 55 to 60%. The  left ventricle has normal function.   3. Right ventricular systolic function is normal. The right ventricular  size is normal.   4. No left atrial/left atrial appendage thrombus was detected.   5. The mitral valve is grossly normal. Trivial mitral valve  regurgitation. No evidence of mitral stenosis.   6. The aortic valve is tricuspid. Aortic valve regurgitation is not  visualized. No aortic stenosis is present.   7. There is mild (Grade II) layered plaque involving the descending  aorta.   Cardiac Cath 08/27/21 Left Main The vessel exhibits minimal luminal irregularities.  Left Anterior Descending Prox LAD to Mid LAD lesion is 80% stenosed. The lesion is eccentric. The lesion is mildly calcified.  Left Circumflex The vessel  exhibits minimal luminal irregularities.  Right Coronary Artery The vessel exhibits minimal luminal irregularities. Mid RCA lesion is 30% stenosed.  EKG 05/14/23 NSR, normal    Neuro/Psych negative neurological ROS  negative psych ROS   GI/Hepatic negative GI ROS, Neg liver ROS,,,  Endo/Other  Obesity HLD GLP-1 RA therapy- last dose 2/23   Renal/GU negative Renal ROS  negative genitourinary   Musculoskeletal  (+) Arthritis , Osteoarthritis,    Abdominal  (+) + obese  Peds  Hematology  (+) Blood dyscrasia (Xarelto) Xarelto therapy- last dose yesterday   Anesthesia Other Findings   Reproductive/Obstetrics                             Anesthesia Physical Anesthesia Plan  ASA: 3  Anesthesia Plan: General   Post-op Pain Management: Tylenol PO (pre-op)*   Induction: Intravenous  PONV Risk Score and Plan: 4 or greater and 3 and Dexamethasone and Ondansetron  Airway Management Planned: Oral ETT  Additional Equipment: None  Intra-op Plan:   Post-operative Plan: Extubation in OR  Informed Consent: I have reviewed the patients History and Physical, chart, labs and discussed the procedure including the risks, benefits and alternatives for the proposed anesthesia with the patient or authorized representative who has indicated his/her understanding and acceptance.     Dental advisory given  Plan Discussed with: CRNA  Anesthesia Plan Comments:        Anesthesia Quick Evaluation

## 2023-08-05 ENCOUNTER — Telehealth (HOSPITAL_COMMUNITY): Payer: Self-pay

## 2023-08-05 ENCOUNTER — Encounter (HOSPITAL_COMMUNITY): Payer: Self-pay | Admitting: Cardiology

## 2023-08-05 LAB — POCT ACTIVATED CLOTTING TIME: Activated Clotting Time: 279 s

## 2023-08-05 NOTE — Anesthesia Postprocedure Evaluation (Signed)
 Anesthesia Post Note  Patient: Colleen Cole  Procedure(s) Performed: ATRIAL FIBRILLATION ABLATION     Patient location during evaluation: PACU Anesthesia Type: General Level of consciousness: awake and alert Pain management: pain level controlled Vital Signs Assessment: post-procedure vital signs reviewed and stable Respiratory status: spontaneous breathing, nonlabored ventilation, respiratory function stable and patient connected to nasal cannula oxygen Cardiovascular status: blood pressure returned to baseline and stable Postop Assessment: no apparent nausea or vomiting Anesthetic complications: no   There were no known notable events for this encounter.  Last Vitals:  Vitals:   08/04/23 1555 08/04/23 1600  BP: (!) 150/82 (!) 148/79  Pulse: 84 80  Resp: 18 (!) 9  Temp: 36.7 C   SpO2: 91% 93%    Last Pain:  Vitals:   08/04/23 1555  TempSrc: Axillary  PainSc: 0-No pain                 Earl Lites P Seleena Reimers

## 2023-08-05 NOTE — Telephone Encounter (Signed)
 Attempted to reach patient to follow up with procedure completed on 08/04/23, no answer. Left VM for patient to return call.

## 2023-08-06 NOTE — Telephone Encounter (Signed)
 Spoke with patient to complete post procedure follow up call.  Patient reports no complications with groin sites. States she plans to remove bandages at puncture sites today.   Instructions reviewed with patient:  It is normal to have bruising, tenderness and a pea or marble sized lump/knot at the groin site which can take up to three months to resolve.  Get help right away if you notice sudden swelling at the puncture site.  Check your puncture site every day for signs of infection: fever, redness, swelling, pus drainage, warmth, foul odor or excessive pain. If this occurs, please call the office at [phone], to speak with the nurse. Get help right away if your puncture site is bleeding and the bleeding does not stop after applying firm pressure to the area.  You may continue to have skipped beats/ atrial fibrillation during the first several months after your procedure.  It is very important not to miss any doses of your blood thinner Xarelto. Patient restarted taking this medication on 08/04/23.   You will follow up with the Afib clinic on 09/01/23 and follow up with the APP on 11/04/23.   Patient verbalized understanding to all instructions provided.

## 2023-08-07 ENCOUNTER — Encounter: Payer: Self-pay | Admitting: Emergency Medicine

## 2023-08-28 ENCOUNTER — Other Ambulatory Visit: Payer: Self-pay | Admitting: Cardiology

## 2023-09-01 ENCOUNTER — Encounter (HOSPITAL_COMMUNITY): Payer: Self-pay | Admitting: Physician Assistant

## 2023-09-01 ENCOUNTER — Ambulatory Visit (HOSPITAL_COMMUNITY)
Admit: 2023-09-01 | Discharge: 2023-09-01 | Disposition: A | Discharge status: Home / Self Care | Source: Ambulatory Visit | Attending: Physician Assistant | Admitting: Physician Assistant

## 2023-09-01 VITALS — BP 108/66 | HR 65 | Ht 62.0 in | Wt 180.8 lb

## 2023-09-01 DIAGNOSIS — Z5181 Encounter for therapeutic drug level monitoring: Secondary | ICD-10-CM | POA: Insufficient documentation

## 2023-09-01 DIAGNOSIS — E785 Hyperlipidemia, unspecified: Secondary | ICD-10-CM | POA: Diagnosis not present

## 2023-09-01 DIAGNOSIS — Z7901 Long term (current) use of anticoagulants: Secondary | ICD-10-CM | POA: Insufficient documentation

## 2023-09-01 DIAGNOSIS — I10 Essential (primary) hypertension: Secondary | ICD-10-CM | POA: Diagnosis not present

## 2023-09-01 DIAGNOSIS — E669 Obesity, unspecified: Secondary | ICD-10-CM | POA: Diagnosis not present

## 2023-09-01 DIAGNOSIS — I480 Paroxysmal atrial fibrillation: Secondary | ICD-10-CM | POA: Diagnosis not present

## 2023-09-01 DIAGNOSIS — D6869 Other thrombophilia: Secondary | ICD-10-CM | POA: Insufficient documentation

## 2023-09-01 DIAGNOSIS — Z6833 Body mass index (BMI) 33.0-33.9, adult: Secondary | ICD-10-CM | POA: Diagnosis not present

## 2023-09-01 DIAGNOSIS — Z955 Presence of coronary angioplasty implant and graft: Secondary | ICD-10-CM | POA: Insufficient documentation

## 2023-09-01 DIAGNOSIS — Z79899 Other long term (current) drug therapy: Secondary | ICD-10-CM | POA: Diagnosis not present

## 2023-09-01 DIAGNOSIS — I2510 Atherosclerotic heart disease of native coronary artery without angina pectoris: Secondary | ICD-10-CM | POA: Insufficient documentation

## 2023-09-01 MED ORDER — AMIODARONE HCL 200 MG PO TABS: 100.0000 mg | ORAL_TABLET | Freq: Every day | ORAL | Status: DC

## 2023-09-01 NOTE — Progress Notes (Signed)
 Primary Care Physician: Aliene Beams, MD Primary Cardiologist: Dr Anne Fu Primary Electrophysiologist: Dr Elberta Fortis Referring Physician: Dr Gerda Diss Colleen Cole is a 78 y.o. female with a history of CAD, HTN, HLD, atrial fibrillation who presents for follow up in the [facility].  Patient has been maintained on amiodarone and underwent afib ablation 12/26/21. Patient is s/p Watchman implant 07/11/22. She had recurrent episodes of afib and was reloaded on amiodarone as a bridge to ablation. She is s/p afib ablation with Dr Elberta Fortis on 08/04/23.  Patient returns for follow up for atrial fibrillation and amiodarone monitoring. She reports that she has done well since the ablation. Her smart watch has not shown any afib. She is in SR today. She does notice a slight "pressure" in her chest when she gets up to start walking. However, the pressure improves with physical activity and she is able to exercise without limitation. These symptoms proceeded her ablation. She denies any groin issues.   Today, she  denies symptoms of palpitations, shortness of breath, orthopnea, PND, lower extremity edema, dizziness, presyncope, syncope, snoring, daytime somnolence, bleeding, or neurologic sequela. The patient is tolerating medications without difficulties and is otherwise without complaint today.    Atrial Fibrillation Risk Factors:  she does not have symptoms or diagnosis of sleep apnea. she does not have a history of rheumatic fever.   Atrial Fibrillation Management history:  Previous antiarrhythmic drugs: amiodarone  Previous cardioversions: none Previous ablations: 12/26/21, 08/04/23 Anticoagulation history: Xarelto    Past Medical History:  Diagnosis Date   Carotid disease, bilateral (HCC)    Coronary artery disease    Hyperlipidemia    Hypertension    Paroxysmal A-fib (HCC)    Presence of Watchman left atrial appendage closure device 07/11/2022   27mm Watchman  implant with Dr. Excell Seltzer    Current Outpatient Medications  Medication Sig Dispense Refill   acetaminophen (TYLENOL) 500 MG tablet Take 1,000 mg by mouth as needed for moderate pain (pain score 4-6).     alendronate (FOSAMAX) 70 MG tablet Take 70 mg by mouth every Sunday.     amiodarone (PACERONE) 200 MG tablet Take 1 tablet (200 mg total) by mouth 2 (two) times daily for 14 days, THEN 1 tablet (200 mg total) daily. 90 tablet 3   aspirin EC 81 MG tablet Take 1 tablet (81 mg total) by mouth daily. Swallow whole. START ON 3/25 90 tablet 3   budesonide-formoterol (SYMBICORT) 160-4.5 MCG/ACT inhaler Inhale 1 puff into the lungs daily.     Calcium Carb-Cholecalciferol (CALCIUM 600 + D PO) Take 1 tablet by mouth 2 (two) times daily.     Carboxymethylcellul-Glycerin (LUBRICATING EYE DROPS OP) Place 1 drop into both eyes daily as needed (dry eyes).     cholecalciferol (VITAMIN D3) 25 MCG (1000 UNIT) tablet Take 1,000 Units by mouth daily.     docusate sodium (COLACE) 100 MG capsule Take 400 mg by mouth every evening.     escitalopram (LEXAPRO) 5 MG tablet Take 5 mg by mouth daily.     fluticasone (FLONASE) 50 MCG/ACT nasal spray Place 1 spray into both nostrils daily as needed for allergies or rhinitis.     hydrALAZINE (APRESOLINE) 25 MG tablet Take 1 tablet (25 mg total) by mouth 2 (two) times daily. (Patient taking differently: Take 25-50 mg by mouth See admin instructions. 2 tabs in the am, 1 at noon, 2 tabs at pm) 180 tablet 3   hydrochlorothiazide (HYDRODIURIL) 25  MG tablet Take 25 mg by mouth daily.     levocetirizine (XYZAL) 5 MG tablet Take 5 mg by mouth every evening.     losartan (COZAAR) 100 MG tablet Take 100 mg by mouth every evening.     Magnesium 400 MG TABS Take 400 mg by mouth daily in the afternoon.     metoprolol tartrate (LOPRESSOR) 50 MG tablet Take 1 tablet (50 mg total) by mouth 2 (two) times daily. 180 tablet 3   nitroGLYCERIN (NITROSTAT) 0.4 MG SL tablet Place 1 tablet (0.4 mg  total) under the tongue every 5 (five) minutes as needed. 25 tablet 2   rivaroxaban (XARELTO) 20 MG TABS tablet Take 1 tablet (20 mg total) by mouth daily with supper. 30 tablet 5   rosuvastatin (CRESTOR) 20 MG tablet TAKE 1 TABLET(20 MG) BY MOUTH EVERY EVENING 90 tablet 2   Semaglutide-Weight Management (WEGOVY) 2.4 MG/0.75ML SOAJ Inject 2.4 mg into the skin once a week. 3 mL 11   senna (SENOKOT) 8.6 MG TABS tablet Take 4 tablets by mouth every evening.     No current facility-administered medications for this encounter.    ROS- All systems are reviewed and negative except as per the HPI above.  Physical Exam: Vitals:   09/01/23 1059  BP: 108/66  Pulse: 65  Weight: 82 kg  Height: 5\' 2"  (1.575 m)    GEN: Well nourished, well developed in no acute distress CARDIAC: Regular rate and rhythm, no murmurs, rubs, gallops RESPIRATORY:  Clear to auscultation without rales, wheezing or rhonchi  ABDOMEN: Soft, non-tender, non-distended EXTREMITIES:  No edema; No deformity    Wt Readings from Last 3 Encounters:  09/01/23 82 kg  08/04/23 80.7 kg  05/27/23 83.7 kg    EKG today demonstrates  SR Vent. rate 65 BPM PR interval 166 ms QRS duration 76 ms QT/QTcB 420/436 ms   Echo 08/21/21 demonstrated   1. Left ventricular ejection fraction, by estimation, is 60 to 65%. The  left ventricle has normal function. The left ventricle has no regional  wall motion abnormalities. Left ventricular diastolic parameters were  normal. The average left ventricular global longitudinal strain is 27.3 %. The global longitudinal strain is normal.   2. Right ventricular systolic function is normal. The right ventricular  size is normal. There is normal pulmonary artery systolic pressure. The  estimated right ventricular systolic pressure is 26.8 mmHg.   3. Left atrial size was moderately dilated.   4. The mitral valve is normal in structure. Trivial mitral valve  regurgitation. No evidence of mitral  stenosis.   5. The aortic valve is tricuspid. Aortic valve regurgitation is not  visualized. No aortic stenosis is present.   6. The inferior vena cava is normal in size with greater than 50%  respiratory variability, suggesting right atrial pressure of 3 mmHg.  Epic records are reviewed at length today   CHA2DS2-VASc Score = 5  The patient's score is based upon: CHF History: 0 HTN History: 1 Diabetes History: 0 Stroke History: 0 Vascular Disease History: 1 Age Score: 2 Gender Score: 1       ASSESSMENT AND PLAN: Paroxysmal Atrial Fibrillation (ICD10:  I48.0) The patient's CHA2DS2-VASc score is 5, indicating a 7.2% annual risk of stroke.   S/p afib ablation 12/26/21 and 08/04/23 Patient appears to be maintaining SR.  Decrease amiodarone to 100 mg daily Continue Lopressor 50 mg BID Continue Xarelto 20 mg daily with no missed doses for 3 months post ablation.  Apple  Watch for home monitoring.   Secondary Hypercoagulable State (ICD10:  D68.69) The patient is at significant risk for stroke/thromboembolism based upon her CHA2DS2-VASc Score of 5.  Continue Rivaroxaban (Xarelto) for now. She is s/p Watchman 07/11/22 and should not need anticoagulation long term.   High Risk Medication Monitoring (ICD 10: Z79.899) Intervals on ECG acceptable for amiodarone monitoring.   Obesity Body mass index is 33.07 kg/m.  Encouraged lifestyle modification  HTN Stable on current regimen  CAD S/p LAD stent No anginal symptoms Followed by Dr Anne Fu    Follow up with Canary Brim NP as scheduled.    Jorja Loa PA-C Afib Clinic [facility] [address] [place], [place] [address] [phone] 09/01/2023 11:12 AM

## 2023-09-01 NOTE — Patient Instructions (Addendum)
 Decrease amiodarone to 100mg  once a day (1/2 of the 200mg  tablet daily)

## 2023-11-03 NOTE — Progress Notes (Signed)
 Electrophysiology Office Note:   Date:  11/04/2023  ID:  Colleen Cole, DOB [birth year], MRN [id number]  Primary Cardiologist: Dorothye Gathers, MD Primary Heart Failure: None Electrophysiologist: Will Cortland Ding, MD      History of Present Illness:   Colleen Cole is a 78 y.o. female with h/o AF, s/p Watchman Device, CAD, HTN, HLD, OSA seen today for routine electrophysiology follow-up s/p Ablation.  Since last being seen in our clinic the patient reports she has been doing well post ablation. No recurrent evidence of AF. She wears an Apple watch for monitoring. No missed doses of OAC. She reports in the first few weeks after she felt a "burning sensation" but it went away.      She denies chest pain, palpitations, dyspnea, PND, orthopnea, nausea, vomiting, dizziness, syncope, edema, weight gain, or early satiety.    Review of systems complete and found to be negative unless listed in HPI.   EP Information / Studies Reviewed:    EKG is ordered today. Personal review as below.  EKG Interpretation Date/Time:  Tuesday November 04 2023 11:12:45 EDT Ventricular Rate:  66 PR Interval:  158 QRS Duration:  74 QT Interval:  404 QTC Calculation: 423 R Axis:   4  Text Interpretation: Normal sinus rhythm Normal ECG Confirmed by Creighton Doffing ([id number]) on 11/04/2023 11:33:53 AM   Studies:  EPS 12/2021 > SR on presentation, successful electrical isolation of all four PV's with RF current, additional LA posterior wall ablation  TEE 07/2022 > LVEF 55-60%, 27 mm Watchman FLX with 22% compression  CT Cardiac Morphology 07/14/23 > normal PV drainage into LA, LAA well occluded EPS 08/04/23 > SR on presentation, successful electrical isolation of PV's with PF ablation, ablation of posterior wall with PF  Arrhythmia / AAD AF Amiodarone  2023 > 11/05/23  Watchman 07/11/22 > 27 mm per Dr. Arlester Ladd. Previously on Xarelto , stop post ablation   Risk Assessment/Calculations:    CHA2DS2-VASc Score = 5   This  indicates a 7.2% annual risk of stroke. The patient's score is based upon: CHF History: 0 HTN History: 1 Diabetes History: 0 Stroke History: 0 Vascular Disease History: 1 Age Score: 2 Gender Score: 1             Physical Exam:   VS:  BP 114/64   Pulse 66   Ht 5\' 2"  (1.575 m)   Wt 178 lb (80.7 kg)   SpO2 95%   BMI 32.56 kg/m    Wt Readings from Last 3 Encounters:  11/04/23 178 lb (80.7 kg)  09/01/23 180 lb 12.8 oz (82 kg)  08/04/23 178 lb (80.7 kg)     GEN: Well nourished, well developed in no acute distress NECK: No JVD; No carotid bruits CARDIAC: Regular rate and rhythm, no murmurs, rubs, gallops RESPIRATORY:  Clear to auscultation without rales, wheezing or rhonchi  ABDOMEN: Soft, non-tender, non-distended EXTREMITIES:  No edema; No deformity   ASSESSMENT AND PLAN:    Paroxysmal Atrial Fibrillation  CHA2DS2-VASc 5, s/p Watchman, AF ablation x2 -continue lopressor  50 mg BID  -Xarelto  20 mg daily for 3 months post ablation > stop as she has completed 3 months post ablation -resume ASA 81 mg given CAD risk / hx Watchman  -EKG with NSR, stable intervals  -uses an Scientist, physiological for home monitoring  High Risk Medication Monitoring  -EKG as above  -stop amiodarone     Hypertension  -well controlled on current regimen    CAD s/p LAD Stent  -  follows with Dr. Renna Cary   Follow up with Dr. Lawana Pray in 12 months  Signed, Creighton Doffing, NP-C, AGACNP-BC [facility] - Electrophysiology  11/04/2023, 12:56 PM

## 2023-11-04 ENCOUNTER — Ambulatory Visit: Attending: Pulmonary Disease | Admitting: Pulmonary Disease

## 2023-11-04 ENCOUNTER — Encounter: Payer: Self-pay | Admitting: Pulmonary Disease

## 2023-11-04 VITALS — BP 114/64 | HR 66 | Ht 62.0 in | Wt 178.0 lb

## 2023-11-04 DIAGNOSIS — Z5181 Encounter for therapeutic drug level monitoring: Secondary | ICD-10-CM | POA: Insufficient documentation

## 2023-11-04 DIAGNOSIS — I2510 Atherosclerotic heart disease of native coronary artery without angina pectoris: Secondary | ICD-10-CM | POA: Diagnosis present

## 2023-11-04 DIAGNOSIS — Z95818 Presence of other cardiac implants and grafts: Secondary | ICD-10-CM | POA: Insufficient documentation

## 2023-11-04 DIAGNOSIS — Z79899 Other long term (current) drug therapy: Secondary | ICD-10-CM | POA: Insufficient documentation

## 2023-11-04 DIAGNOSIS — I480 Paroxysmal atrial fibrillation: Secondary | ICD-10-CM | POA: Diagnosis not present

## 2023-11-04 DIAGNOSIS — Z955 Presence of coronary angioplasty implant and graft: Secondary | ICD-10-CM | POA: Diagnosis present

## 2023-11-04 NOTE — Patient Instructions (Addendum)
 Medication Instructions:   START TAKING:  ASPRIN 81 MG ONCE  A DAY   STOP TAKING AND REMOVE THIS MEDICATION FROM YOUR MEDICATION LIST:  XARELTO   AND AMIODARONE    *If you need a refill on your cardiac medications before your next appointment, please call your pharmacy*  Lab Work: NONE ORDERED  TODAY    If you have labs (blood work) drawn today and your tests are completely normal, you will receive your results only by: MyChart Message (if you have MyChart) OR A paper copy in the mail If you have any lab test that is abnormal or we need to change your treatment, we will call you to review the results.   Testing/Procedures: NONE ORDERED  TODAY    Follow-Up: At [facility], you and your health needs are our priority.  As part of our continuing mission to provide you with exceptional heart care, our providers are all part of one team.  This team includes your primary Cardiologist (physician) and Advanced Practice Providers or APPs (Physician Assistants and Nurse Practitioners) who all work together to provide you with the care you need, when you need it.  Your next appointment:   1 year(s)  Provider:   You may see Will Cortland Ding, MD or one of the following Advanced Practice Providers on your designated Care Team:   Creighton Doffing, NP   We recommend signing up for the patient portal called "MyChart".  Sign up information is provided on this After Visit Summary.  MyChart is used to connect with patients for Virtual Visits (Telemedicine).  Patients are able to view lab/test results, encounter notes, upcoming appointments, etc.  Non-urgent messages can be sent to your provider as well.   To learn more about what you can do with MyChart, go to [email or web address].   Other Instructions

## 2024-02-18 ENCOUNTER — Other Ambulatory Visit (HOSPITAL_BASED_OUTPATIENT_CLINIC_OR_DEPARTMENT_OTHER): Payer: Self-pay

## 2024-02-18 MED ORDER — COMIRNATY 30 MCG/0.3ML IM SUSY
0.3000 mL | PREFILLED_SYRINGE | Freq: Once | INTRAMUSCULAR | 0 refills | Status: AC
  Filled 2024-02-18: qty 0.3, 1d supply, fill #0

## 2024-02-18 MED ORDER — FLUZONE HIGH-DOSE 0.5 ML IM SUSY
0.5000 mL | PREFILLED_SYRINGE | Freq: Once | INTRAMUSCULAR | 0 refills | Status: AC
  Filled 2024-02-18: qty 0.5, 1d supply, fill #0

## 2024-03-08 ENCOUNTER — Other Ambulatory Visit: Payer: Self-pay | Admitting: Family Medicine

## 2024-03-08 DIAGNOSIS — Z1231 Encounter for screening mammogram for malignant neoplasm of breast: Secondary | ICD-10-CM

## 2024-03-11 ENCOUNTER — Other Ambulatory Visit (HOSPITAL_BASED_OUTPATIENT_CLINIC_OR_DEPARTMENT_OTHER): Payer: Self-pay

## 2024-04-06 ENCOUNTER — Ambulatory Visit
Admission: RE | Admit: 2024-04-06 | Discharge: 2024-04-06 | Disposition: A | Discharge status: Home / Self Care | Source: Ambulatory Visit | Attending: Family Medicine | Admitting: Family Medicine

## 2024-04-06 DIAGNOSIS — Z1231 Encounter for screening mammogram for malignant neoplasm of breast: Secondary | ICD-10-CM

## 2024-04-25 ENCOUNTER — Other Ambulatory Visit (HOSPITAL_BASED_OUTPATIENT_CLINIC_OR_DEPARTMENT_OTHER): Payer: Self-pay | Admitting: Family Medicine

## 2024-04-25 DIAGNOSIS — M810 Age-related osteoporosis without current pathological fracture: Secondary | ICD-10-CM

## 2024-05-20 ENCOUNTER — Other Ambulatory Visit: Payer: Self-pay | Admitting: Cardiology

## 2024-05-21 ENCOUNTER — Other Ambulatory Visit: Payer: Self-pay | Admitting: Cardiology

## 2024-05-25 ENCOUNTER — Telehealth: Payer: Self-pay

## 2024-05-25 NOTE — Telephone Encounter (Signed)
 Patient returned call. The patient reports doing well with no issues.  Arranged routine recall OV with Dr. Jeffrie for 06/23/2024. The patient understands to call with questions or concerns.

## 2024-05-25 NOTE — Telephone Encounter (Signed)
 Left voicemail for patient to return call to see how she is doing s/p LAAO 07/11/2022.

## 2024-06-23 ENCOUNTER — Encounter: Payer: Self-pay | Admitting: Cardiology

## 2024-06-23 ENCOUNTER — Ambulatory Visit: Admitting: Cardiology

## 2024-06-23 VITALS — BP 122/72 | HR 80 | Ht 62.0 in | Wt 186.4 lb

## 2024-06-23 DIAGNOSIS — Z95818 Presence of other cardiac implants and grafts: Secondary | ICD-10-CM | POA: Insufficient documentation

## 2024-06-23 DIAGNOSIS — I2510 Atherosclerotic heart disease of native coronary artery without angina pectoris: Secondary | ICD-10-CM | POA: Insufficient documentation

## 2024-06-23 DIAGNOSIS — I10 Essential (primary) hypertension: Secondary | ICD-10-CM | POA: Insufficient documentation

## 2024-06-23 DIAGNOSIS — I480 Paroxysmal atrial fibrillation: Secondary | ICD-10-CM | POA: Diagnosis not present

## 2024-06-23 DIAGNOSIS — Z955 Presence of coronary angioplasty implant and graft: Secondary | ICD-10-CM | POA: Diagnosis present

## 2024-06-23 NOTE — Progress Notes (Signed)
 " Cardiology Office Note:  .   Date:  06/23/2024  ID:  Colleen Cole, DOB 04-30-1946, MRN [id number] PCP: Rolinda Millman, MD  [facility] Providers Cardiologist:  Oneil Parchment, MD Electrophysiologist:  Will Gladis Norton, MD     History of Present Illness: .   Colleen Cole is a 79 y.o. female Discussed the use of AI scribe   History of Present Illness Colleen Cole is a 79 year old female with atrial fibrillation and coronary artery disease who presents for follow-up post-ablation and Watchman procedure.  She underwent ablation procedures in 2023 and 2025, with a normal ejection fraction of 60% noted in 2024. A 27-millimeter Watchman device has been placed, and she previously had a stent placed in the LAD. She was on Xarelto , which was stopped post-ablation, and is currently taking aspirin . She has discontinued the use of amiodarone . Her current medications include Crestor  20 mg daily, aspirin  81 mg daily, losartan  100 mg daily, hydrochlorothiazide  25 mg daily, and metoprolol  50 mg twice daily.  She is doing well and engages in physical activities, including attending a 'Fit and Balance' class at [employer], which she finds beneficial for her balance and conditioning. She mentions a previous fall due to stepping on a toy, which increased her awareness of the importance of balance exercises.  She has been on Wegovy  for weight loss related to her coronary artery disease and mentions taking Zepbound. She notes not losing weight and acknowledges dietary habits as a factor.  No chest pain and significant improvement in shortness of breath. She occasionally experiences a 'flutter' sensation in her heart, but it is infrequent.   Recently came back from [facility] for convention with Ronal and Artist and the children.  Enjoyed the [place].      Studies Reviewed: .        Results Labs LDL (04/2024): 50 Creatinine (04/2024): 0.72 Hemoglobin A1c (04/2024): 5.4 Hemoglobin  (04/2024): 13.1 Vitamin B12 (05/18/2024): 761  Diagnostic Echocardiogram (2024): Left ventricular ejection fraction 60% Risk Assessment/Calculations:            Physical Exam:   VS:  BP 122/72   Pulse 80   Ht 5' 2 (1.575 m)   Wt 186 lb 6.4 oz (84.6 kg)   SpO2 96%   BMI 34.09 kg/m    Wt Readings from Last 3 Encounters:  06/23/24 186 lb 6.4 oz (84.6 kg)  11/04/23 178 lb (80.7 kg)  09/01/23 180 lb 12.8 oz (82 kg)    GEN: Well nourished, well developed in no acute distress NECK: No JVD; No carotid bruits CARDIAC: Rare ectopy, RRR, no murmurs, no rubs, no gallops RESPIRATORY:  Clear to auscultation without rales, wheezing or rhonchi  ABDOMEN: Soft, non-tender, non-distended EXTREMITIES:  No edema; No deformity   ASSESSMENT AND PLAN: .    Assessment and Plan Assessment & Plan Atrial fibrillation status post ablation and Watchman device Status post ablation in 2023 and 2025 with normal ejection fraction in 2024. Watchman procedure with 27 mm device. No current anticoagulation with Xarelto , protected by Watchman device. Occasional heart flutter, but no significant AFib episodes. Low burden of AFib detected by watch, 2% or less. - Continue aspirin  81 mg daily - Continue metoprolol  75 mg twice daily  Coronary artery disease with prior LAD stent Coronary artery disease with LAD stent placed by Dr. Wonda. No current chest pain or unusual symptoms. Shortness of breath improved with increased fitness. - Continue aspirin  81 mg daily - Continue  Crestor  20 mg daily  Hypertension Blood pressure well-controlled at 122/72 mmHg. Current medications include losartan , hydrochlorothiazide , and metoprolol . - Continue losartan  100 mg daily - Continue hydrochlorothiazide  25 mg daily - Continue metoprolol  75 mg twice daily  Hyperlipidemia LDL cholesterol well-controlled at 50 mg/dL with Crestor . - Continue Crestor  20 mg daily  Weight management Management with Zepbound for weight loss. No  significant weight loss reported, possibly due to dietary habits during recent travel. Engaged in physical activities including Musician and Balance classes. - Continue Zepbound - Encouraged continued physical activity and dietary awareness         Dispo: 1 yr  Signed, Oneil Parchment, MD  "

## 2024-06-23 NOTE — Patient Instructions (Signed)
 Medication: The current medical regimen is effective;  continue present plan and medications.  *If you need a refill on your cardiac medications before your next appointment, please call your pharmacy*  Follow-Up: At [facility], you and your health needs are our priority.  As part of our continuing mission to provide you with exceptional heart care, our providers are all part of one team.  This team includes your primary Cardiologist (physician) and Advanced Practice Providers or APPs (Physician Assistants and Nurse Practitioners) who all work together to provide you with the care you need, when you need it.  Your next appointment:   1 year(s)  Provider:   Oneil Parchment, MD    We recommend signing up for the patient portal called MyChart.  Sign up information is provided on this After Visit Summary.  MyChart is used to connect with patients for Virtual Visits (Telemedicine).  Patients are able to view lab/test results, encounter notes, upcoming appointments, etc.  Non-urgent messages can be sent to your provider as well.   To learn more about what you can do with MyChart, go to [email or web address].

## 2024-06-26 ENCOUNTER — Other Ambulatory Visit: Payer: Self-pay | Admitting: Cardiology

## 2024-07-02 NOTE — Telephone Encounter (Signed)
 In accordance with refill protocols, please review and address the following requirements before this medication refill can be authorized:  Labs  Pt needs a lipid panel done within 12 months, pt has not done so.

## 2024-11-01 ENCOUNTER — Other Ambulatory Visit (HOSPITAL_BASED_OUTPATIENT_CLINIC_OR_DEPARTMENT_OTHER)
